# Patient Record
Sex: Female | Born: 1935 | Race: White | Hispanic: No | Marital: Single | Smoking: Never smoker
Health system: Southern US, Community
[De-identification: ages and names within clinical notes are randomized; demographics above are authoritative.]

## PROBLEM LIST (undated history)

## (undated) DIAGNOSIS — M1990 Unspecified osteoarthritis, unspecified site: Secondary | ICD-10-CM

## (undated) DIAGNOSIS — N189 Chronic kidney disease, unspecified: Secondary | ICD-10-CM

## (undated) DIAGNOSIS — R42 Dizziness and giddiness: Secondary | ICD-10-CM

## (undated) DIAGNOSIS — T7840XA Allergy, unspecified, initial encounter: Secondary | ICD-10-CM

## (undated) DIAGNOSIS — E039 Hypothyroidism, unspecified: Secondary | ICD-10-CM

## (undated) DIAGNOSIS — E785 Hyperlipidemia, unspecified: Secondary | ICD-10-CM

## (undated) DIAGNOSIS — I10 Essential (primary) hypertension: Secondary | ICD-10-CM

## (undated) DIAGNOSIS — M810 Age-related osteoporosis without current pathological fracture: Secondary | ICD-10-CM

## (undated) DIAGNOSIS — C9000 Multiple myeloma not having achieved remission: Secondary | ICD-10-CM

## (undated) DIAGNOSIS — H269 Unspecified cataract: Secondary | ICD-10-CM

## (undated) DIAGNOSIS — I639 Cerebral infarction, unspecified: Secondary | ICD-10-CM

## (undated) HISTORY — PX: TONSILLECTOMY: SUR1361

## (undated) HISTORY — DX: Allergy, unspecified, initial encounter: T78.40XA

## (undated) HISTORY — DX: Chronic kidney disease, unspecified: N18.9

## (undated) HISTORY — PX: ABDOMINAL HYSTERECTOMY: SHX81

## (undated) HISTORY — DX: Unspecified cataract: H26.9

## (undated) HISTORY — DX: Multiple myeloma not having achieved remission: C90.00

## (undated) HISTORY — DX: Essential (primary) hypertension: I10

## (undated) HISTORY — DX: Unspecified osteoarthritis, unspecified site: M19.90

## (undated) HISTORY — DX: Cerebral infarction, unspecified: I63.9

## (undated) HISTORY — PX: TONSILLECTOMY AND ADENOIDECTOMY: SHX28

---
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
## 1972-01-29 HISTORY — PX: APPENDECTOMY: SHX54

## 2004-05-07 ENCOUNTER — Ambulatory Visit: Payer: Self-pay | Admitting: Unknown Physician Specialty

## 2004-09-19 ENCOUNTER — Ambulatory Visit: Payer: Self-pay | Admitting: Unknown Physician Specialty

## 2005-05-22 ENCOUNTER — Ambulatory Visit: Payer: Self-pay | Admitting: Unknown Physician Specialty

## 2005-06-03 ENCOUNTER — Ambulatory Visit: Payer: Self-pay | Admitting: Unknown Physician Specialty

## 2005-10-16 ENCOUNTER — Ambulatory Visit: Payer: Self-pay | Admitting: Unknown Physician Specialty

## 2005-11-04 HISTORY — PX: BREAST BIOPSY: SHX20

## 2005-12-02 ENCOUNTER — Ambulatory Visit: Payer: Self-pay | Admitting: General Surgery

## 2006-06-11 ENCOUNTER — Ambulatory Visit: Payer: Self-pay | Admitting: General Surgery

## 2007-06-11 ENCOUNTER — Ambulatory Visit: Payer: Self-pay | Admitting: Unknown Physician Specialty

## 2007-06-18 ENCOUNTER — Ambulatory Visit: Payer: Self-pay | Admitting: Unknown Physician Specialty

## 2007-11-08 ENCOUNTER — Emergency Department: Payer: Self-pay | Admitting: Unknown Physician Specialty

## 2008-06-22 ENCOUNTER — Ambulatory Visit: Payer: Self-pay | Admitting: Unknown Physician Specialty

## 2009-07-19 ENCOUNTER — Ambulatory Visit: Payer: Self-pay | Admitting: Unknown Physician Specialty

## 2010-02-05 ENCOUNTER — Ambulatory Visit: Payer: Self-pay | Admitting: Unknown Physician Specialty

## 2010-08-07 ENCOUNTER — Ambulatory Visit: Payer: Self-pay | Admitting: Unknown Physician Specialty

## 2010-10-30 ENCOUNTER — Ambulatory Visit: Payer: Self-pay | Admitting: Unknown Physician Specialty

## 2010-10-30 LAB — HM COLONOSCOPY

## 2011-06-19 DIAGNOSIS — H2510 Age-related nuclear cataract, unspecified eye: Secondary | ICD-10-CM | POA: Diagnosis not present

## 2011-06-29 HISTORY — PX: EYE SURGERY: SHX253

## 2011-07-02 ENCOUNTER — Ambulatory Visit: Payer: Self-pay | Admitting: Ophthalmology

## 2011-07-02 DIAGNOSIS — I10 Essential (primary) hypertension: Secondary | ICD-10-CM | POA: Diagnosis not present

## 2011-07-02 DIAGNOSIS — Z01812 Encounter for preprocedural laboratory examination: Secondary | ICD-10-CM | POA: Diagnosis not present

## 2011-07-02 DIAGNOSIS — Z01810 Encounter for preprocedural cardiovascular examination: Secondary | ICD-10-CM | POA: Diagnosis not present

## 2011-07-02 DIAGNOSIS — H2510 Age-related nuclear cataract, unspecified eye: Secondary | ICD-10-CM | POA: Diagnosis not present

## 2011-07-02 LAB — POTASSIUM: Potassium: 3.5 mmol/L (ref 3.5–5.1)

## 2011-07-08 DIAGNOSIS — E785 Hyperlipidemia, unspecified: Secondary | ICD-10-CM | POA: Diagnosis not present

## 2011-07-08 DIAGNOSIS — R351 Nocturia: Secondary | ICD-10-CM | POA: Diagnosis not present

## 2011-07-08 DIAGNOSIS — R5381 Other malaise: Secondary | ICD-10-CM | POA: Diagnosis not present

## 2011-07-08 DIAGNOSIS — I10 Essential (primary) hypertension: Secondary | ICD-10-CM | POA: Diagnosis not present

## 2011-07-08 DIAGNOSIS — R5383 Other fatigue: Secondary | ICD-10-CM | POA: Diagnosis not present

## 2011-07-10 DIAGNOSIS — I10 Essential (primary) hypertension: Secondary | ICD-10-CM | POA: Diagnosis not present

## 2011-07-10 DIAGNOSIS — E785 Hyperlipidemia, unspecified: Secondary | ICD-10-CM | POA: Diagnosis not present

## 2011-07-10 DIAGNOSIS — M25569 Pain in unspecified knee: Secondary | ICD-10-CM | POA: Diagnosis not present

## 2011-07-15 ENCOUNTER — Ambulatory Visit: Payer: Self-pay | Admitting: Ophthalmology

## 2011-07-15 DIAGNOSIS — H269 Unspecified cataract: Secondary | ICD-10-CM | POA: Diagnosis not present

## 2011-07-15 DIAGNOSIS — R609 Edema, unspecified: Secondary | ICD-10-CM | POA: Diagnosis not present

## 2011-07-15 DIAGNOSIS — Z79899 Other long term (current) drug therapy: Secondary | ICD-10-CM | POA: Diagnosis not present

## 2011-07-15 DIAGNOSIS — Z9109 Other allergy status, other than to drugs and biological substances: Secondary | ICD-10-CM | POA: Diagnosis not present

## 2011-07-15 DIAGNOSIS — K219 Gastro-esophageal reflux disease without esophagitis: Secondary | ICD-10-CM | POA: Diagnosis not present

## 2011-07-15 DIAGNOSIS — H2510 Age-related nuclear cataract, unspecified eye: Secondary | ICD-10-CM | POA: Diagnosis not present

## 2011-07-15 DIAGNOSIS — M129 Arthropathy, unspecified: Secondary | ICD-10-CM | POA: Diagnosis not present

## 2011-07-15 DIAGNOSIS — M810 Age-related osteoporosis without current pathological fracture: Secondary | ICD-10-CM | POA: Diagnosis not present

## 2011-07-15 DIAGNOSIS — Z7982 Long term (current) use of aspirin: Secondary | ICD-10-CM | POA: Diagnosis not present

## 2011-08-08 ENCOUNTER — Ambulatory Visit: Payer: Self-pay | Admitting: Physician Assistant

## 2011-08-08 DIAGNOSIS — Z1231 Encounter for screening mammogram for malignant neoplasm of breast: Secondary | ICD-10-CM | POA: Diagnosis not present

## 2011-08-08 DIAGNOSIS — M79609 Pain in unspecified limb: Secondary | ICD-10-CM | POA: Diagnosis not present

## 2011-08-15 DIAGNOSIS — M779 Enthesopathy, unspecified: Secondary | ICD-10-CM | POA: Diagnosis not present

## 2011-08-15 DIAGNOSIS — M79609 Pain in unspecified limb: Secondary | ICD-10-CM | POA: Diagnosis not present

## 2011-08-30 ENCOUNTER — Ambulatory Visit (INDEPENDENT_AMBULATORY_CARE_PROVIDER_SITE_OTHER): Payer: Medicare Other | Admitting: Internal Medicine

## 2011-08-30 ENCOUNTER — Encounter: Payer: Self-pay | Admitting: Internal Medicine

## 2011-08-30 VITALS — BP 126/68 | HR 80 | Temp 98.0°F | Resp 16 | Ht 64.0 in | Wt 162.5 lb

## 2011-08-30 DIAGNOSIS — M810 Age-related osteoporosis without current pathological fracture: Secondary | ICD-10-CM

## 2011-08-30 DIAGNOSIS — K219 Gastro-esophageal reflux disease without esophagitis: Secondary | ICD-10-CM

## 2011-08-30 DIAGNOSIS — I10 Essential (primary) hypertension: Secondary | ICD-10-CM | POA: Diagnosis not present

## 2011-08-30 DIAGNOSIS — E785 Hyperlipidemia, unspecified: Secondary | ICD-10-CM | POA: Diagnosis not present

## 2011-08-30 NOTE — Progress Notes (Signed)
Patient ID: Kyan Giannone, female   DOB: March 25, 1934, 77 y.o.   MRN: [id number]  Patient Active Problem List  Diagnosis  . Hypertension  . Hyperlipidemia LDL goal < 100  . Osteoporosis, post-menopausal    Subjective:  CC:   Chief Complaint  Patient presents with  . New Patient    HPI:   Osiris Charles is a 77 y.o. female who presents as a new patient to establish primary care with the chief complaint of Hypertension.  She is a healthy 76 year old white female with a history of osteoporosis and hypertension, previously managed [facility]. She was last seen by Dr. Lin Givens in May of 2012 at which time cholesterol was well controlled and renal function was normal. She has kept a detailed list of every procedure ever done to her starting in 1946 with a tonsillectomy. She is up-to-date on all screening with her last mammogram occurring in July 2013  at [facility], her last physical in June of 2013 ,  And her last colonoscopy noting only internal hemorrhoids in October 2012. Her last bone density test was June 2012. She feels generally well. She is physically active with a walking program several days a week. She maintains a healthy diet has no trouble sleeping, and has no history of recent or remote falls.   Past Medical History  Diagnosis Date  . Hypertension     Past Surgical History  Procedure Date  . Abdominal hysterectomy     Family History  Problem Relation Age of Onset  . Heart disease Mother   . Cancer Sister 59    colon ca,  . Cancer Sister     AML    History   Social History  . Marital Status: Single    Spouse Name: N/A    Number of Children: N/A  . Years of Education: N/A   Occupational History  . Not on file.   Social History Main Topics  . Smoking status: Never Smoker   . Smokeless tobacco: Never Used  . Alcohol Use: No  . Drug Use: No  . Sexually Active: Not on file   Other Topics Concern  . Not on file   Social History  Narrative  . No narrative on file        Allergies  Allergen Reactions  . Ciprofloxacin   . Flagyl (Metronidazole)   . Iodine   . Sudafed (Pseudoephedrine Hcl)     Review of Systems:   The remainder of the review of systems was negative except those addressed in the HPI.       Objective:  BP 126/68  Pulse 80  Temp 98 F (36.7 C) (Oral)  Resp 16  Ht 5\' 4"  (1.626 m)  Wt 162 lb 8 oz (73.71 kg)  BMI 27.89 kg/m2  SpO2 96%  General appearance: alert, cooperative and appears stated age Ears: normal TM's and external ear canals both ears Throat: lips, mucosa, and tongue normal; teeth and gums normal Neck: no adenopathy, no carotid bruit, supple, symmetrical, trachea midline and thyroid not enlarged, symmetric, no tenderness/mass/nodules Back: symmetric, no curvature. ROM normal. No CVA tenderness. Lungs: clear to auscultation bilaterally Heart: regular rate and rhythm, S1, S2 normal, no murmur, click, rub or gallop Abdomen: soft, non-tender; bowel sounds normal; no masses,  no organomegaly Pulses: 2+ and symmetric Skin: Skin color, texture, turgor normal. No rashes or lesions Lymph nodes: Cervical, supraclavicular, and axillary nodes normal.  Assessment and Plan:  Hypertension Currently well-controlled.  She has had normal renal function per review of prior records. She is due for repeat fasting lipids and renal function this year. No changes today.  Hyperlipidemia LDL goal < 100 Well-controlled on diet alone by review of prior May 2012 fasting lipids with an LDL of 91 HDL of 39.5. However she does have mild triglyceridemia with triglycerides of 237 at that time. Low glycemic index diet and walking program for exercise were discussed today as ways to manage this without pharmacotherapy. We will repeat this panel prior to her next visit.  Osteoporosis, post-menopausal With no history of fractures. She has no interest in pharmacotherapy at this point. She does have a  history of adverse reactions to multiple medications including Cipro Flagyl anesthetics etc. Will request copies of her last DEXA scan and discuss at next visit. Continue calcium and vitamin D   Updated Medication List Outpatient Encounter Prescriptions as of 08/30/2011  Medication Sig Dispense Refill  . aspirin 81 MG tablet Take 81 mg by mouth daily.      . calcium-vitamin D (OSCAL WITH D) 250-125 MG-UNIT per tablet Take 1 tablet by mouth daily.      . Cholecalciferol (VITAMIN D3) 1000 UNITS CAPS Take by mouth.      . conjugated estrogens (PREMARIN) vaginal cream Place 1 g vaginally 3 (three) times a week.      . fish oil-omega-3 fatty acids 1000 MG capsule Take 2 g by mouth daily.      . metoprolol tartrate (LOPRESSOR) 25 MG tablet Take 25 mg by mouth 2 (two) times daily.      . Multiple Vitamin (MULTIVITAMIN) tablet Take 1 tablet by mouth daily.      Marland Kitchen triamterene-hydrochlorothiazide (MAXZIDE-25) 37.5-25 MG per tablet Take 0.5 tablets by mouth daily.         Orders Placed This Encounter  Procedures  . HM MAMMOGRAPHY  . HM PAP SMEAR  . HM COLONOSCOPY    No Follow-up on file.

## 2011-09-01 ENCOUNTER — Encounter: Payer: Self-pay | Admitting: Internal Medicine

## 2011-09-01 DIAGNOSIS — M810 Age-related osteoporosis without current pathological fracture: Secondary | ICD-10-CM | POA: Insufficient documentation

## 2011-09-01 DIAGNOSIS — K219 Gastro-esophageal reflux disease without esophagitis: Secondary | ICD-10-CM | POA: Insufficient documentation

## 2011-09-01 NOTE — Assessment & Plan Note (Signed)
With no history of fractures. She has no interest in pharmacotherapy at this point. She does have a history of adverse reactions to multiple medications including Cipro Flagyl anesthetics etc. Will request copies of her last DEXA scan and discuss at next visit. Continue calcium and vitamin D

## 2011-09-01 NOTE — Assessment & Plan Note (Signed)
Well-controlled on diet alone by review of prior May 2012 fasting lipids with an LDL of 91 HDL of 39.5. However she does have mild triglyceridemia with triglycerides of 237 at that time. Low glycemic index diet and walking program for exercise were discussed today as ways to manage this without pharmacotherapy. We will repeat this panel prior to her next visit.

## 2011-09-01 NOTE — Assessment & Plan Note (Signed)
Re: manage with dietary restrictions.

## 2011-09-01 NOTE — Assessment & Plan Note (Signed)
Currently well-controlled. She has had normal renal function per review of prior records. She is due for repeat fasting lipids and renal function this year. No changes today.

## 2011-10-22 DIAGNOSIS — Z23 Encounter for immunization: Secondary | ICD-10-CM | POA: Diagnosis not present

## 2011-12-03 DIAGNOSIS — K649 Unspecified hemorrhoids: Secondary | ICD-10-CM | POA: Diagnosis not present

## 2011-12-11 DIAGNOSIS — K645 Perianal venous thrombosis: Secondary | ICD-10-CM | POA: Diagnosis not present

## 2011-12-30 ENCOUNTER — Encounter: Payer: Self-pay | Admitting: Internal Medicine

## 2011-12-30 ENCOUNTER — Ambulatory Visit (INDEPENDENT_AMBULATORY_CARE_PROVIDER_SITE_OTHER): Payer: Medicare Other | Admitting: Internal Medicine

## 2011-12-30 VITALS — BP 128/68 | HR 78 | Temp 98.3°F | Resp 12 | Ht 64.0 in | Wt 164.0 lb

## 2011-12-30 DIAGNOSIS — R739 Hyperglycemia, unspecified: Secondary | ICD-10-CM

## 2011-12-30 DIAGNOSIS — I10 Essential (primary) hypertension: Secondary | ICD-10-CM | POA: Diagnosis not present

## 2011-12-30 DIAGNOSIS — K649 Unspecified hemorrhoids: Secondary | ICD-10-CM | POA: Diagnosis not present

## 2011-12-30 DIAGNOSIS — Z1331 Encounter for screening for depression: Secondary | ICD-10-CM

## 2011-12-30 DIAGNOSIS — R7309 Other abnormal glucose: Secondary | ICD-10-CM

## 2011-12-30 DIAGNOSIS — K648 Other hemorrhoids: Secondary | ICD-10-CM

## 2011-12-30 DIAGNOSIS — E785 Hyperlipidemia, unspecified: Secondary | ICD-10-CM

## 2011-12-30 DIAGNOSIS — Z79899 Other long term (current) drug therapy: Secondary | ICD-10-CM

## 2011-12-30 LAB — BASIC METABOLIC PANEL
BUN: 22 mg/dL (ref 6–23)
CO2: 32 mEq/L (ref 19–32)
Calcium: 9.4 mg/dL (ref 8.4–10.5)
Creatinine, Ser: 1.1 mg/dL (ref 0.4–1.2)
GFR: 53.71 mL/min — ABNORMAL LOW (ref >60.00)
Glucose, Bld: 124 mg/dL — ABNORMAL HIGH (ref 70–99)

## 2011-12-30 MED ORDER — TETANUS-DIPHTH-ACELL PERTUSSIS 5-2.5-18.5 LF-MCG/0.5 IM SUSP: 0.5000 mL | Freq: Once | INTRAMUSCULAR | Status: DC

## 2011-12-30 MED ORDER — DTAP-HEPATITIS B RECOMB-IPV IM SUSP: 0.5000 mL | Freq: Once | INTRAMUSCULAR | Status: DC

## 2011-12-30 NOTE — Patient Instructions (Addendum)
The TDaP vaccine should cost you less than $110 at one of the local drugstores or the [facility]

## 2011-12-30 NOTE — Progress Notes (Signed)
Patient ID: Tina Silva, female   DOB: [birth year], 77 y.o.   MRN: [id number]  Patient Active Problem List  Diagnosis  . Hypertension  . Hyperlipidemia LDL goal < 100  . Osteoporosis, post-menopausal  . Acid reflux  . Hemorrhoid prolapse  . Hyperglycemia    Subjective:  CC:   Chief Complaint  Patient presents with  . Follow-up    HPI:   Tina Matlockis a 77 y.o. female who presents  Follow up on chronic and acute issues.  On Nov 5th had a marble sized hemorrhoid prolapsed after a bowel movement. She was evaluated at Urgent care and referred to Dr. Michela Pitcher for a thrombosed hemorrhoid.  The hemorrhoid is not causing any discomfort currently and is resolving slowly on its own. Not constipated historically, but had done some heavy lifting of furniture the week prior and plays trombone in the church [employer]  Past Medical History  Diagnosis Date  . Hypertension     Past Surgical History  Procedure Date  . Abdominal hysterectomy   . Appendectomy 1974  . Eye surgery june 2013    cataract with lens,  dinglein left eye     The following portions of the patient's history were reviewed and updated as appropriate: Allergies, current medications, and problem list.    Review of Systems:  Patient denies headache, fevers, malaise, unintentional weight loss, skin rash, eye pain, sinus congestion and sinus pain, sore throat, dysphagia,  hemoptysis , cough, dyspnea, wheezing, chest pain, palpitations, orthopnea, edema, abdominal pain, nausea, melena, diarrhea, constipation, flank pain, dysuria, hematuria, urinary  Frequency, nocturia, numbness, tingling, seizures,  Focal weakness, Loss of consciousness,  Tremor, insomnia, depression, anxiety, and suicidal ideation.       History   Social History  . Marital Status: Single    Spouse Name: N/A    Number of Children: N/A  . Years of Education: N/A   Occupational History  . Not on file.   Social History Main Topics  . Smoking status:  Never Smoker   . Smokeless tobacco: Never Used  . Alcohol Use: No  . Drug Use: No  . Sexually Active: Not on file   Other Topics Concern  . Not on file   Social History Narrative  . No narrative on file    Objective:  BP 128/68  Pulse 78  Temp 98.3 F (36.8 C) (Oral)  Resp 12  Ht 5\' 4"  (1.626 m)  Wt 164 lb (74.39 kg)  BMI 28.15 kg/m2  SpO2 96%  General appearance: alert, cooperative and appears stated age Ears: normal TM's and external ear canals both ears Throat: lips, mucosa, and tongue normal; teeth and gums normal Neck: no adenopathy, no carotid bruit, supple, symmetrical, trachea midline and thyroid not enlarged, symmetric, no tenderness/mass/nodules Back: symmetric, no curvature. ROM normal. No CVA tenderness. Lungs: clear to auscultation bilaterally Heart: regular rate and rhythm, S1, S2 normal, no murmur, click, rub or gallop Abdomen: soft, non-tender; bowel sounds normal; no masses,  no organomegaly Pulses: 2+ and symmetric Skin: Skin color, texture, turgor normal. No rashes or lesions Lymph nodes: Cervical, supraclavicular, and axillary nodes normal.  Assessment and Plan:  Hemorrhoid prolapse Recommend stool softeners and HC cream prn.   Hypertension Well controlled on current regimen. Renal function stable, no changes today.  Hyperglycemia Noted on BMET today.  Will have patient return for fasting glucose and hgba1c.     Updated Medication List Outpatient Encounter Prescriptions as of 12/30/2011  Medication Sig Dispense Refill  .  aspirin 81 MG tablet Take 81 mg by mouth daily.      . calcium-vitamin D (OSCAL WITH D) 250-125 MG-UNIT per tablet Take 1 tablet by mouth daily.      . Cholecalciferol (VITAMIN D3) 1000 UNITS CAPS Take by mouth.      . conjugated estrogens (PREMARIN) vaginal cream Place 1 g vaginally 3 (three) times a week.      . fish oil-omega-3 fatty acids 1000 MG capsule Take 2 g by mouth daily.      . metoprolol tartrate (LOPRESSOR) 25  MG tablet Take 25 mg by mouth 2 (two) times daily.      . Multiple Vitamin (MULTIVITAMIN) tablet Take 1 tablet by mouth daily.      Marland Kitchen triamterene-hydrochlorothiazide (MAXZIDE-25) 37.5-25 MG per tablet Take 0.5 tablets by mouth daily.      . TDaP (BOOSTRIX) 5-2.5-18.5 LF-MCG/0.5 injection Inject 0.5 mLs into the muscle once.  0.5 mL  0  . [DISCONTINUED] DTAP-hepatitis B recombinant-IPV (PEDIARIX) injection Inject 0.5 mLs into the muscle once.  0.5 mL  0  . [DISCONTINUED] TDaP (BOOSTRIX) 5-2.5-18.5 LF-MCG/0.5 injection Inject 0.5 mLs into the muscle once.  0.5 mL  0       .

## 2012-01-01 ENCOUNTER — Encounter: Payer: Self-pay | Admitting: Internal Medicine

## 2012-01-01 DIAGNOSIS — R739 Hyperglycemia, unspecified: Secondary | ICD-10-CM | POA: Insufficient documentation

## 2012-01-01 DIAGNOSIS — K648 Other hemorrhoids: Secondary | ICD-10-CM | POA: Insufficient documentation

## 2012-01-01 NOTE — Assessment & Plan Note (Signed)
Recommend stool softeners and HC cream prn.

## 2012-01-01 NOTE — Assessment & Plan Note (Signed)
Noted on BMET today.  Will have patient return for fasting glucose and hgba1c.

## 2012-01-01 NOTE — Assessment & Plan Note (Signed)
Well controlled on current regimen. Renal function stable, no changes today. 

## 2012-01-10 ENCOUNTER — Other Ambulatory Visit (INDEPENDENT_AMBULATORY_CARE_PROVIDER_SITE_OTHER): Payer: Medicare Other

## 2012-01-10 DIAGNOSIS — R739 Hyperglycemia, unspecified: Secondary | ICD-10-CM

## 2012-01-10 DIAGNOSIS — R7309 Other abnormal glucose: Secondary | ICD-10-CM | POA: Diagnosis not present

## 2012-01-10 DIAGNOSIS — E785 Hyperlipidemia, unspecified: Secondary | ICD-10-CM

## 2012-01-10 LAB — COMPREHENSIVE METABOLIC PANEL
ALT: 22 U/L (ref 0–35)
BUN: 21 mg/dL (ref 6–23)
CO2: 30 mEq/L (ref 19–32)
Calcium: 9.1 mg/dL (ref 8.4–10.5)
Chloride: 102 mEq/L (ref 96–112)
Creatinine, Ser: 1 mg/dL (ref 0.4–1.2)
GFR: 57.44 mL/min — ABNORMAL LOW (ref >60.00)
Glucose, Bld: 105 mg/dL — ABNORMAL HIGH (ref 70–99)

## 2012-01-10 LAB — LIPID PANEL
Cholesterol: 182 mg/dL (ref 0–200)
Total CHOL/HDL Ratio: 5
Triglycerides: 128 mg/dL (ref 0.0–149.0)

## 2012-01-10 LAB — HEMOGLOBIN A1C: Hgb A1c MFr Bld: 5.6 % (ref 4.6–6.5)

## 2012-01-13 NOTE — Addendum Note (Signed)
Addended by: Sherlene Shams on: 01/13/2012 12:35 PM   Modules accepted: Orders

## 2012-02-04 ENCOUNTER — Encounter: Payer: Self-pay | Admitting: Internal Medicine

## 2012-02-04 ENCOUNTER — Ambulatory Visit (INDEPENDENT_AMBULATORY_CARE_PROVIDER_SITE_OTHER): Payer: Medicare Other | Admitting: Internal Medicine

## 2012-02-04 VITALS — BP 140/80 | HR 83 | Temp 98.1°F | Resp 16 | Wt 162.2 lb

## 2012-02-04 DIAGNOSIS — J069 Acute upper respiratory infection, unspecified: Secondary | ICD-10-CM

## 2012-02-04 MED ORDER — AMOXICILLIN-POT CLAVULANATE 875-125 MG PO TABS: 1.0000 | ORAL_TABLET | Freq: Two times a day (BID) | ORAL | Status: DC

## 2012-02-04 NOTE — Patient Instructions (Addendum)
You have a viral  Syndrome .  The post nasal drip is causing your hoarseness.  Flush your sinuses twice daily with Simply saline nasal spray.  Use benadryl 25 mg every 8 hours for drainage and Sudafed PE 10 mg  every 8 hours to manage any congestion.  Gargle with salt water or "alkalol" (available OTC) for the sore throat. Or try warm tea with honey  Use Robitussin DM  Or any cough syrup that has guaifenesin and dextromethorphan for the cough   If the sinus drainage  is no better  In 3 to 4 days OR  if you develop T > 100.4,  Ear  Or facial pain,  Start the amoxicillin (antibiotic).

## 2012-02-04 NOTE — Assessment & Plan Note (Signed)
Symptoms of  URI are caused by viral infection currently given her current symptoms.   I have explained that in viral URIS, an antibiotic will not help the symptoms and will increase the risk of developing diarrhea. Advised to use oral and nasal decongestants,  Ibuprofen 400 mg and tylenol 650 mq 8 hrs for aches and pains,  tessalon every 8 hours prn cough  Advised to start round of abx only if symptoms worsen to include fevers, facial pain, purulent sputum./drainage.  

## 2012-02-04 NOTE — Progress Notes (Signed)
Patient ID: Tina Silva, female   DOB: [birth year], 78 y.o.   MRN: [id number]  Patient Active Problem List  Diagnosis  . Hypertension  . Hyperlipidemia LDL goal < 100  . Osteoporosis, post-menopausal  . Acid reflux  . Hemorrhoid prolapse  . Hyperglycemia  . Acute URI    Subjective:  CC:   Chief Complaint  Patient presents with  . Sinusitis    x 6 days    HPI:   Tina Comins Matlockis a 78 y.o. female who presents with URi symptoms .  She develop ed a hacking cough which started on January 1 followed by loss of her voice in January 2. Her cough is actually improved.  Chief symptom is  purulent sinus drainage without sinus pain fevers or ear pain. No wheezing or dyspnea. 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  she had her flu shot this year . Has not gone at the hospital since the onset of her symptoms.   Past Medical History  Diagnosis Date  . Hypertension     Past Surgical History  Procedure Date  . Abdominal hysterectomy   . Appendectomy 1974  . Eye surgery june 2013    cataract with lens,  dinglein left eye          The following portions of the patient's history were reviewed and updated as appropriate: Allergies, current medications, and problem list.    Review of Systems:   Patient denies headache, fevers, malaise, unintentional weight loss, skin rash, eye pain, sinus congestion and sinus pain, sore throat, dysphagia,  hemoptysis , cough, dyspnea, wheezing, chest pain, palpitations, orthopnea, edema, abdominal pain, nausea, melena, diarrhea, constipation, flank pain, dysuria, hematuria, urinary  Frequency, nocturia, numbness, tingling, seizures,  Focal weakness, Loss of consciousness,  Tremor, insomnia, depression, anxiety, and suicidal ideation.       History   Social History  . Marital Status: Single    Spouse Name: N/A    Number of Children: N/A  . Years of Education: N/A   Occupational History  . Not on file.   Social History Main  Topics  . Smoking status: Never Smoker   . Smokeless tobacco: Never Used  . Alcohol Use: No  . Drug Use: No  . Sexually Active: Not on file   Other Topics Concern  . Not on file   Social History Narrative  . No narrative on file    Objective:  BP 140/80  Pulse 83  Temp 98.1 F (36.7 C) (Oral)  Resp 16  Wt 162 lb 4 oz (73.596 kg)  SpO2 93%  General appearance: alert, cooperative and appears stated age Ears: normal TM's and external ear canals both ears Throat: lips, mucosa, and tongue normal; teeth and gums normal Neck: no adenopathy, no carotid bruit, supple, symmetrical, trachea midline and thyroid not enlarged, symmetric, no tenderness/mass/nodules Back: symmetric, no curvature. ROM normal. No CVA tenderness. Lungs: clear to auscultation bilaterally Heart: regular rate and rhythm, S1, S2 normal, no murmur, click, rub or gallop Abdomen: soft, non-tender; bowel sounds normal; no masses,  no organomegaly Pulses: 2+ and symmetric Skin: Skin color, texture, turgor normal. No rashes or lesions Lymph nodes: Cervical, supraclavicular, and axillary nodes normal.  Assessment and Plan:  Acute URI Symptoms of  URI are caused by viral infection currently given her current symptoms.   I have explained that in viral URIS, an antibiotic will not help the symptoms and will increase the risk of developing diarrhea. Advised to use  oral and nasal decongestants,  Ibuprofen 400 mg and tylenol 650 mq 8 hrs for aches and pains,  tessalon every 8 hours prn cough  Advised to start round of abx only if symptoms worsen to include fevers, facial pain, purulent sputum./drainage.    Updated Medication List Outpatient Encounter Prescriptions as of 02/04/2012  Medication Sig Dispense Refill  . aspirin 81 MG tablet Take 81 mg by mouth daily.      . calcium-vitamin D (OSCAL WITH D) 250-125 MG-UNIT per tablet Take 1 tablet by mouth daily.      . Cholecalciferol (VITAMIN D3) 1000 UNITS CAPS Take by mouth.       . conjugated estrogens (PREMARIN) vaginal cream Place 1 g vaginally 3 (three) times a week.      . fish oil-omega-3 fatty acids 1000 MG capsule Take 2 g by mouth daily.      . metoprolol tartrate (LOPRESSOR) 25 MG tablet Take 25 mg by mouth 2 (two) times daily.      . Multiple Vitamin (MULTIVITAMIN) tablet Take 1 tablet by mouth daily.      . TDaP (BOOSTRIX) 5-2.5-18.5 LF-MCG/0.5 injection Inject 0.5 mLs into the muscle once.  0.5 mL  0  . triamterene-hydrochlorothiazide (MAXZIDE-25) 37.5-25 MG per tablet Take 0.5 tablets by mouth daily.      Marland Kitchen amoxicillin-clavulanate (AUGMENTIN) 875-125 MG per tablet Take 1 tablet by mouth 2 (two) times daily.  14 tablet  0     No orders of the defined types were placed in this encounter.    No Follow-up on file.

## 2012-04-24 DIAGNOSIS — Z961 Presence of intraocular lens: Secondary | ICD-10-CM | POA: Diagnosis not present

## 2012-04-24 DIAGNOSIS — H2510 Age-related nuclear cataract, unspecified eye: Secondary | ICD-10-CM | POA: Diagnosis not present

## 2012-05-05 DIAGNOSIS — M659 Synovitis and tenosynovitis, unspecified: Secondary | ICD-10-CM | POA: Diagnosis not present

## 2012-06-12 ENCOUNTER — Other Ambulatory Visit: Payer: Self-pay | Admitting: *Deleted

## 2012-06-12 MED ORDER — METOPROLOL TARTRATE 25 MG PO TABS: 25.0000 mg | ORAL_TABLET | Freq: Two times a day (BID) | ORAL | Status: DC

## 2012-06-12 NOTE — Telephone Encounter (Signed)
Rx sent to pharmacy by escript  

## 2012-06-16 DIAGNOSIS — K625 Hemorrhage of anus and rectum: Secondary | ICD-10-CM | POA: Diagnosis not present

## 2012-06-30 ENCOUNTER — Encounter: Payer: Self-pay | Admitting: Internal Medicine

## 2012-06-30 ENCOUNTER — Other Ambulatory Visit (HOSPITAL_COMMUNITY)
Admission: RE | Admit: 2012-06-30 | Discharge: 2012-06-30 | Disposition: A | Discharge status: Home / Self Care | Payer: Medicare Other | Source: Ambulatory Visit | Attending: Internal Medicine | Admitting: Internal Medicine

## 2012-06-30 ENCOUNTER — Ambulatory Visit (INDEPENDENT_AMBULATORY_CARE_PROVIDER_SITE_OTHER)
Admission: RE | Admit: 2012-06-30 | Discharge: 2012-06-30 | Disposition: A | Discharge status: Home / Self Care | Payer: Medicare Other | Source: Ambulatory Visit | Attending: Internal Medicine | Admitting: Internal Medicine

## 2012-06-30 ENCOUNTER — Ambulatory Visit (INDEPENDENT_AMBULATORY_CARE_PROVIDER_SITE_OTHER): Payer: Medicare Other | Admitting: Internal Medicine

## 2012-06-30 VITALS — BP 130/70 | HR 64 | Temp 98.4°F | Resp 14 | Ht 64.0 in | Wt 166.2 lb

## 2012-06-30 DIAGNOSIS — M5431 Sciatica, right side: Secondary | ICD-10-CM

## 2012-06-30 DIAGNOSIS — E559 Vitamin D deficiency, unspecified: Secondary | ICD-10-CM

## 2012-06-30 DIAGNOSIS — M5430 Sciatica, unspecified side: Secondary | ICD-10-CM

## 2012-06-30 DIAGNOSIS — M25551 Pain in right hip: Secondary | ICD-10-CM | POA: Insufficient documentation

## 2012-06-30 DIAGNOSIS — R5383 Other fatigue: Secondary | ICD-10-CM | POA: Diagnosis not present

## 2012-06-30 DIAGNOSIS — R5381 Other malaise: Secondary | ICD-10-CM

## 2012-06-30 DIAGNOSIS — Z124 Encounter for screening for malignant neoplasm of cervix: Secondary | ICD-10-CM | POA: Diagnosis not present

## 2012-06-30 DIAGNOSIS — Z79899 Other long term (current) drug therapy: Secondary | ICD-10-CM

## 2012-06-30 DIAGNOSIS — Z1151 Encounter for screening for human papillomavirus (HPV): Secondary | ICD-10-CM | POA: Diagnosis not present

## 2012-06-30 DIAGNOSIS — I10 Essential (primary) hypertension: Secondary | ICD-10-CM

## 2012-06-30 DIAGNOSIS — M25559 Pain in unspecified hip: Secondary | ICD-10-CM

## 2012-06-30 DIAGNOSIS — Z0000 Encounter for general adult medical examination without abnormal findings: Secondary | ICD-10-CM

## 2012-06-30 DIAGNOSIS — M47817 Spondylosis without myelopathy or radiculopathy, lumbosacral region: Secondary | ICD-10-CM | POA: Diagnosis not present

## 2012-06-30 DIAGNOSIS — Z01419 Encounter for gynecological examination (general) (routine) without abnormal findings: Secondary | ICD-10-CM | POA: Insufficient documentation

## 2012-06-30 DIAGNOSIS — E785 Hyperlipidemia, unspecified: Secondary | ICD-10-CM

## 2012-06-30 DIAGNOSIS — R7309 Other abnormal glucose: Secondary | ICD-10-CM | POA: Diagnosis not present

## 2012-06-30 DIAGNOSIS — M5137 Other intervertebral disc degeneration, lumbosacral region: Secondary | ICD-10-CM | POA: Diagnosis not present

## 2012-06-30 DIAGNOSIS — R739 Hyperglycemia, unspecified: Secondary | ICD-10-CM

## 2012-06-30 LAB — CBC WITH DIFFERENTIAL/PLATELET
Basophils Relative: 0.4 % (ref 0.0–3.0)
Eosinophils Absolute: 0.1 10*3/uL (ref 0.0–0.7)
Eosinophils Relative: 1.5 % (ref 0.0–5.0)
HCT: 41.6 % (ref 36.0–46.0)
Lymphs Abs: 1.4 10*3/uL (ref 0.7–4.0)
MCHC: 34.2 g/dL (ref 30.0–36.0)
MCV: 94.3 fl (ref 78.0–100.0)
Monocytes Absolute: 0.4 10*3/uL (ref 0.1–1.0)
Neutrophils Relative %: 72.2 % (ref 43.0–77.0)
RBC: 4.41 Mil/uL (ref 3.87–5.11)

## 2012-06-30 LAB — COMPREHENSIVE METABOLIC PANEL
ALT: 20 U/L (ref 0–35)
AST: 21 U/L (ref 0–37)
Albumin: 3.7 g/dL (ref 3.5–5.2)
Alkaline Phosphatase: 59 U/L (ref 39–117)
Calcium: 9.3 mg/dL (ref 8.4–10.5)
Chloride: 104 mEq/L (ref 96–112)
Creatinine, Ser: 1 mg/dL (ref 0.4–1.2)
Potassium: 4 mEq/L (ref 3.5–5.1)

## 2012-06-30 NOTE — Assessment & Plan Note (Signed)
Annual comprehensive exam was done including breast, pelvic and PAP smear. All screenings have been addressed .  

## 2012-06-30 NOTE — Progress Notes (Signed)
Patient ID: Tina Silva, female   DOB: 05-04-1934, 78 y.o.   MRN: [id number] The patient is here for annual Medicare wellness examination and management of other chronic and acute problems. 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  [facility] with diclofenac bid which she took for 12 days.  The pain improved while she was on it, but returned when she stopped it.  It would occasionally radiate to the foot.  No x rays done . Still bothering her is she sits for more than 1 hours, limps for a while  Saw Dr Sherie Don for hemorrhoids,  Treated with anusol.     The risk factors are reflected in the social history.  The roster of all physicians providing medical care to patient - is listed in the Snapshot section of the chart.  Activities of daily living:  The patient is 100% independent in all ADLs: dressing, toileting, feeding as well as independent mobility  Home safety : The patient has smoke detectors in the home. They wear seatbelts.  There are no firearms at home. There is no violence in the home.   There is no risks for hepatitis, STDs or HIV. There is no   history of blood transfusion. They have no travel history to infectious disease endemic areas of the world.  The patient has seen their dentist in the last six month. They have seen their eye doctor in the last year. They admit to slight hearing difficulty with regard to whispered voices and some television programs.  They have deferred audiologic testing in the last year.  They do not  have excessive sun exposure. Discussed the need for sun protection: hats, long sleeves and use of sunscreen if there is significant sun exposure.   Diet: the importance of a healthy diet is discussed. They do have a healthy diet.  The benefits of regular aerobic exercise were discussed. She walks 4 times per week ,  20 minutes.   Depression screen: there are no signs or  vegative symptoms of depression- irritability, change in appetite, anhedonia, sadness/tearfullness.  Cognitive assessment: the patient manages all their financial and personal affairs and is actively engaged. They could relate day,date,year and events; recalled 2/3 objects at 3 minutes; performed clock-face Silva normally.  The following portions of the patient's history were reviewed and updated as appropriate: allergies, current medications, past family history, past medical history,  past surgical history, past social history  and problem list.  Visual acuity was not assessed per patient preference since she has regular follow up with her ophthalmologist. Hearing and body mass index were assessed and reviewed.   During the course of the visit the patient was educated and counseled about appropriate screening and preventive services including : fall prevention , diabetes screening, nutrition counseling, colorectal cancer screening, and recommended immunizations.    Objective:  BP 130/70  Pulse 64  Temp(Src) 98.4 F (36.9 C) (Oral)  Resp 14  Ht 5\' 4"  (1.626 m)  Wt 166 lb 4 oz (75.411 kg)  BMI 28.52 kg/m2  SpO2 95%  General Appearance:    Alert, cooperative, no distress, appears stated age  Head:    Normocephalic, without obvious abnormality, atraumatic  Eyes:    PERRL, conjunctiva/corneas clear, EOM's intact, fundi    benign, both eyes  Ears:    Normal TM's and external ear canals, both ears  Nose:   Nares normal,  septum midline, mucosa normal, no drainage    or sinus tenderness  Throat:   Lips, mucosa, and tongue normal; teeth and gums normal  Neck:   Supple, symmetrical, trachea midline, no adenopathy;    thyroid:  no enlargement/tenderness/nodules; no carotid   bruit or JVD  Back:     Symmetric, no curvature, ROM normal, no CVA tenderness  Lungs:     Clear to auscultation bilaterally, respirations unlabored  Chest Wall:    No tenderness or deformity   Heart:    Regular rate and  rhythm, S1 and S2 normal, no murmur, rub   or gallop  Breast Exam:    No tenderness, masses, or nipple abnormality  Abdomen:     Soft, non-tender, bowel sounds active all four quadrants,    no masses, no organomegaly  Genitalia:    Pelvic: cervix normal in appearance, external genitalia normal, no adnexal masses or tenderness, no cervical motion tenderness, rectovaginal septum normal, uterus normal size, shape, and consistency and vagina normal without discharge  Extremities:   Extremities normal, atraumatic, no cyanosis or edema  Pulses:   2+ and symmetric all extremities  Skin:   Skin color, texture, turgor normal, no rashes or lesions  Lymph nodes:   Cervical, supraclavicular, and axillary nodes normal  Neurologic:   CNII-XII intact, normal strength, sensation and reflexes    throughout   Assessment and Plan  Routine general medical examination at a health care facility Annual comprehensive exam was done including breast, pelvic and PAP smear. All screenings have been addressed .   Right hip pain Plain films of lumbar spine and right hip were done due to history of osteoporosis  And radiculopathy .  Plain filns were negative for fractrures  But positive for degenerative disease and osteophyte formation which may be causing sciatic nerve root impingement.  Will refer for PT,   Hypertension Well controlled on current regimen. Renal function stable, no changes today.   Hyperlipidemia LDL goal < 100 Well controlled on current regimen. liver enzymes are stable, no changes today.   Updated Medication List Outpatient Encounter Prescriptions as of 06/30/2012  Medication Sig Dispense Refill  . aspirin 81 MG tablet Take 81 mg by mouth daily.      . calcium-vitamin D (OSCAL WITH D) 250-125 MG-UNIT per tablet Take 1 tablet by mouth daily.      . Cholecalciferol (VITAMIN D3) 1000 UNITS CAPS Take by mouth.      . metoprolol tartrate (LOPRESSOR) 25 MG tablet Take 1 tablet (25 mg total) by mouth  2 (two) times daily.  30 tablet  5  . Multiple Vitamin (MULTIVITAMIN) tablet Take 1 tablet by mouth daily.      . TDaP (BOOSTRIX) 5-2.5-18.5 LF-MCG/0.5 injection Inject 0.5 mLs into the muscle once.  0.5 mL  0  . triamterene-hydrochlorothiazide (MAXZIDE-25) 37.5-25 MG per tablet Take 0.5 tablets by mouth daily.      Marland Kitchen amoxicillin-clavulanate (AUGMENTIN) 875-125 MG per tablet Take 1 tablet by mouth 2 (two) times daily.  14 tablet  0  . conjugated estrogens (PREMARIN) vaginal cream Place 1 g vaginally 3 (three) times a week.      . fish oil-omega-3 fatty acids 1000 MG capsule Take 2 g by mouth daily.       No facility-administered encounter medications on file as of 06/30/2012.

## 2012-06-30 NOTE — Assessment & Plan Note (Signed)
Well controlled on current regimen. Renal function stable, no changes today. 

## 2012-06-30 NOTE — Assessment & Plan Note (Signed)
Well controlled on current regimen. liver enzymes are stable, no changes today.

## 2012-06-30 NOTE — Patient Instructions (Addendum)
I have ordered Plain x rays of hip and lumbar spine  To be done at the [facility] office today   Call us when your mammogram reminder comes.   You can resume the diclofenac, but if the abdominal pain recurs, stop taking it

## 2012-06-30 NOTE — Assessment & Plan Note (Signed)
Plain films of lumbar spine and right hip were done due to history of osteoporosis  And radiculopathy .  Plain filns were negative for fractrures  But positive for degenerative disease and osteophyte formation which may be causing sciatic nerve root impingement.  Will refer for PT,

## 2012-07-01 ENCOUNTER — Encounter: Payer: Self-pay | Admitting: Internal Medicine

## 2012-07-03 ENCOUNTER — Encounter: Payer: Self-pay | Admitting: *Deleted

## 2012-07-07 MED ORDER — DICLOFENAC SODIUM 75 MG PO TBEC: 75.0000 mg | DELAYED_RELEASE_TABLET | Freq: Two times a day (BID) | ORAL | Status: DC

## 2012-07-09 ENCOUNTER — Telehealth: Payer: Self-pay | Admitting: Internal Medicine

## 2012-07-09 NOTE — Telephone Encounter (Signed)
No order needed for screening mammogram

## 2012-07-09 NOTE — Telephone Encounter (Signed)
Patient needing an order for a screening mammogram sent to [facility]. She will schedule her own appointment.

## 2012-07-13 DIAGNOSIS — Z23 Encounter for immunization: Secondary | ICD-10-CM | POA: Diagnosis not present

## 2012-07-20 ENCOUNTER — Telehealth: Payer: Self-pay | Admitting: Internal Medicine

## 2012-07-20 DIAGNOSIS — M545 Low back pain: Secondary | ICD-10-CM

## 2012-07-20 NOTE — Telephone Encounter (Signed)
Pt came in today stating that her back pain is not any better. Pt wants to know what dr Darrick Huntsman wants to do next,  Xray report from 06/30/12 in box

## 2012-07-21 MED ORDER — TRAMADOL HCL 50 MG PO TABS: 50.0000 mg | ORAL_TABLET | Freq: Two times a day (BID) | ORAL | Status: DC | PRN

## 2012-07-21 NOTE — Telephone Encounter (Signed)
She is being referred to [facility] for physical therapy.  Her plain films suggested degenerative disk disease .  If her pain becomes constant radiating to her foot, she will need an MRI.  I will call in tramadol for pain control.  She cann take it along with the diclofenac twice daily

## 2012-07-21 NOTE — Telephone Encounter (Signed)
Patient notified as instructed and advised patient to call back if medication does not help.

## 2012-08-10 ENCOUNTER — Ambulatory Visit: Payer: Self-pay | Admitting: Internal Medicine

## 2012-08-10 DIAGNOSIS — Z1231 Encounter for screening mammogram for malignant neoplasm of breast: Secondary | ICD-10-CM | POA: Diagnosis not present

## 2012-08-10 LAB — HM MAMMOGRAPHY: HM Mammogram: NORMAL

## 2012-08-21 ENCOUNTER — Encounter: Payer: Self-pay | Admitting: Internal Medicine

## 2012-08-25 ENCOUNTER — Encounter: Payer: Self-pay | Admitting: Internal Medicine

## 2012-08-25 NOTE — Telephone Encounter (Signed)
Immunization record updated.

## 2012-11-24 ENCOUNTER — Ambulatory Visit (INDEPENDENT_AMBULATORY_CARE_PROVIDER_SITE_OTHER): Payer: Medicare Other | Admitting: Internal Medicine

## 2012-11-24 ENCOUNTER — Encounter: Payer: Self-pay | Admitting: Internal Medicine

## 2012-11-24 VITALS — BP 118/72 | HR 69 | Temp 98.4°F | Resp 12 | Ht 64.0 in | Wt 144.5 lb

## 2012-11-24 DIAGNOSIS — M25579 Pain in unspecified ankle and joints of unspecified foot: Secondary | ICD-10-CM | POA: Diagnosis not present

## 2012-11-24 DIAGNOSIS — M25571 Pain in right ankle and joints of right foot: Secondary | ICD-10-CM

## 2012-11-24 MED ORDER — MELOXICAM 15 MG PO TABS: 15.0000 mg | ORAL_TABLET | Freq: Every day | ORAL | Status: DC

## 2012-11-24 NOTE — Progress Notes (Signed)
Patient ID: Tina Silva, female   DOB: 1934/03/31, 78 y.o.   MRN: [id number]  Patient Active Problem List   Diagnosis Date Noted  . Pain in joint, ankle and foot 11/25/2012  . Routine general medical examination at a health care facility 06/30/2012  . Right hip pain 06/30/2012  . Hemorrhoid prolapse 01/01/2012  . Hyperglycemia 01/01/2012  . Osteoporosis, post-menopausal 09/01/2011  . Acid reflux 09/01/2011  . Hypertension 08/30/2011  . Hyperlipidemia LDL goal < 100 08/30/2011    Subjective:  CC:   Chief Complaint  Patient presents with  . Acute Visit    Burning sensation in top right foot patient reports edema at toe area.    HPI:   Tina Lightsey Matlockis a 78 y.o. female who presents with several weeks of dorsal foot pain after walking,  Right foot only,  Has noticed tender areas on lateral e on anklel and foot as well.  The shoes were not new , they wer not well worn,  Noticed a swelling to the top of foot   No history of gout.  Has been walking for years on a treadmill but quit since this pain developed.    Past Medical History  Diagnosis Date  . Hypertension     Past Surgical History  Procedure Laterality Date  . Abdominal hysterectomy    . Appendectomy  1974  . Eye surgery  june 2013    cataract with lens,  dinglein left eye        The following portions of the patient's history were reviewed and updated as appropriate: Allergies, current medications, and problem list.    Review of Systems:   12 Pt  review of systems was negative except those addressed in the HPI,     History   Social History  . Marital Status: Single    Spouse Name: N/A    Number of Children: N/A  . Years of Education: N/A   Occupational History  . Not on file.   Social History Main Topics  . Smoking status: Never Smoker   . Smokeless tobacco: Never Used  . Alcohol Use: No  . Drug Use: No  . Sexual Activity: Not on file   Other Topics Concern  . Not on file   Social  History Narrative  . No narrative on file    Objective:  Filed Vitals:   11/24/12 1012  BP: 118/72  Pulse: 69  Temp: 98.4 F (36.9 C)  Resp: 12     General appearance: alert, cooperative and appears stated age  Back: symmetric, no curvature. ROM normal. No CVA tenderness. Lungs: clear to auscultation bilaterally Heart: regular rate and rhythm, S1, S2 normal, no murmur, click, rub or gallop Abdomen: soft, non-tender; bowel sounds normal; no masses,  no organomegaly Pulses: 2+ and symmetric Skin: Skin color, texture, turgor normal. No rashes or lesions MSK right foot nontender, no bruising or swelling,  Pulses normal  Lymph nodes: Cervical, supraclavicular, and axillary nodes normal.  Assessment and Plan:  Pain in joint, ankle and foot Exam consistent with ligament strain on dorsum of foot, extensor hallicus longus most likely.   NSAIDs, ice, rest.  Podiatry referral if no improvement in 2 weeks.    Updated Medication List Outpatient Encounter Prescriptions as of 11/24/2012  Medication Sig Dispense Refill  . aspirin 81 MG tablet Take 81 mg by mouth daily.      . metoprolol tartrate (LOPRESSOR) 25 MG tablet Take 1 tablet (25 mg total) by mouth  2 (two) times daily.  30 tablet  5  . Multiple Vitamin (MULTIVITAMIN) tablet Take 1 tablet by mouth daily.      Marland Kitchen triamterene-hydrochlorothiazide (MAXZIDE-25) 37.5-25 MG per tablet Take 0.5 tablets by mouth daily.      . [DISCONTINUED] TDaP (BOOSTRIX) 5-2.5-18.5 LF-MCG/0.5 injection Inject 0.5 mLs into the muscle once.  0.5 mL  0  . calcium-vitamin D (OSCAL WITH D) 250-125 MG-UNIT per tablet Take 1 tablet by mouth daily.      . Cholecalciferol (VITAMIN D3) 1000 UNITS CAPS Take by mouth.      . meloxicam (MOBIC) 15 MG tablet Take 1 tablet (15 mg total) by mouth daily.  30 tablet  1  . [DISCONTINUED] amoxicillin-clavulanate (AUGMENTIN) 875-125 MG per tablet Take 1 tablet by mouth 2 (two) times daily.  14 tablet  0  . [DISCONTINUED]  conjugated estrogens (PREMARIN) vaginal cream Place 1 g vaginally 3 (three) times a week.      . [DISCONTINUED] diclofenac (VOLTAREN) 75 MG EC tablet Take 1 tablet (75 mg total) by mouth 2 (two) times daily.  60 tablet  3  . [DISCONTINUED] fish oil-omega-3 fatty acids 1000 MG capsule Take 2 g by mouth daily.      . [DISCONTINUED] traMADol (ULTRAM) 50 MG tablet Take 1 tablet (50 mg total) by mouth 2 (two) times daily as needed for pain.  60 tablet  2   No facility-administered encounter medications on file as of 11/24/2012.     No orders of the defined types were placed in this encounter.    No Follow-up on file.

## 2012-11-24 NOTE — Patient Instructions (Addendum)
I believe your foot pain is coming from a strained tendon on the top of your foot and on the side  I am prescribing a once daily anti inflammatory called meloxicam .  You can take this once a day with tylenol if needed BUT NOT WITH MOTRIN OR ALEVE because they are too similar  Elevate you leg and apply ice for 15 minutes after walking if needed for swelling   If your pain does not improve in 2 weeks we will have you see a podiatrist

## 2012-11-25 ENCOUNTER — Encounter: Payer: Self-pay | Admitting: Internal Medicine

## 2012-11-25 DIAGNOSIS — M25579 Pain in unspecified ankle and joints of unspecified foot: Secondary | ICD-10-CM | POA: Insufficient documentation

## 2012-11-25 NOTE — Assessment & Plan Note (Addendum)
Exam consistent with ligament strain on dorsum of foot, extensor hallicus longus most likely.   NSAIDs, ice, rest.  Podiatry referral if no improvement in 2 weeks.

## 2013-01-14 ENCOUNTER — Other Ambulatory Visit: Payer: Self-pay | Admitting: Internal Medicine

## 2013-05-21 DIAGNOSIS — H2510 Age-related nuclear cataract, unspecified eye: Secondary | ICD-10-CM | POA: Diagnosis not present

## 2013-06-22 ENCOUNTER — Encounter: Payer: Self-pay | Admitting: Adult Health

## 2013-06-22 ENCOUNTER — Ambulatory Visit (INDEPENDENT_AMBULATORY_CARE_PROVIDER_SITE_OTHER): Payer: Medicare Other | Admitting: Adult Health

## 2013-06-22 VITALS — BP 102/60 | HR 64 | Temp 98.4°F | Resp 16 | Ht 64.0 in | Wt 145.8 lb

## 2013-06-22 DIAGNOSIS — G8929 Other chronic pain: Secondary | ICD-10-CM

## 2013-06-22 DIAGNOSIS — R21 Rash and other nonspecific skin eruption: Secondary | ICD-10-CM | POA: Diagnosis not present

## 2013-06-22 DIAGNOSIS — R1031 Right lower quadrant pain: Secondary | ICD-10-CM | POA: Diagnosis not present

## 2013-06-22 LAB — CBC WITH DIFFERENTIAL/PLATELET
BASOS ABS: 0 10*3/uL (ref 0.0–0.1)
Basophils Relative: 0.4 % (ref 0.0–3.0)
EOS ABS: 0.1 10*3/uL (ref 0.0–0.7)
Eosinophils Relative: 1.1 % (ref 0.0–5.0)
HEMATOCRIT: 41.1 % (ref 36.0–46.0)
HEMOGLOBIN: 13.7 g/dL (ref 12.0–15.0)
LYMPHS ABS: 1.3 10*3/uL (ref 0.7–4.0)
Lymphocytes Relative: 21.9 % (ref 12.0–46.0)
MCHC: 33.4 g/dL (ref 30.0–36.0)
MCV: 95.2 fl (ref 78.0–100.0)
MONO ABS: 0.3 10*3/uL (ref 0.1–1.0)
MONOS PCT: 5.5 % (ref 3.0–12.0)
NEUTROS ABS: 4.3 10*3/uL (ref 1.4–7.7)
Neutrophils Relative %: 71.1 % (ref 43.0–77.0)
Platelets: 160 10*3/uL (ref 150.0–400.0)
RBC: 4.32 Mil/uL (ref 3.87–5.11)
RDW: 12.3 % (ref 11.5–15.5)
WBC: 6.1 10*3/uL (ref 4.0–10.5)

## 2013-06-22 LAB — BASIC METABOLIC PANEL
BUN: 26 mg/dL — ABNORMAL HIGH (ref 6–23)
CALCIUM: 9.5 mg/dL (ref 8.4–10.5)
CO2: 31 meq/L (ref 19–32)
CREATININE: 1 mg/dL (ref 0.4–1.2)
Chloride: 103 mEq/L (ref 96–112)
GFR: 55.3 mL/min — AB (ref >60.00)
Glucose, Bld: 91 mg/dL (ref 70–99)
Potassium: 4.6 mEq/L (ref 3.5–5.1)
Sodium: 140 mEq/L (ref 135–145)

## 2013-06-22 NOTE — Patient Instructions (Signed)
  Please have your blood drawn prior to leaving the office.  I will contact you with the results once they are available.  Please take benadryl 12.5 mg - 25 mg every 8 hours. Also take zantac 150 mg twice a day. Take both of these for 4 days.   Please call if no improvement in symptoms.

## 2013-06-22 NOTE — Progress Notes (Signed)
Pre visit review using our clinic review tool, if applicable. No additional management support is needed unless otherwise documented below in the visit note. 

## 2013-06-22 NOTE — Progress Notes (Signed)
Patient ID: Tina Silva, female   DOB: November 27, 1934, 79 y.o.   MRN: [id number]   Subjective:    Patient ID: Tina Silva, female    DOB: [birth year], 79 y.o.   MRN: [id number]  HPI Is and 79 year old female who presents to clinic with 2 concerns:  1. She has a rash on bilateral lower extremity that she first noticed a couple of days ago. She notices the rash is worse first thing in the morning. She has not taken any new medications, prescribed or over-the-counter. No new lotions, soaps, perfumes or body washes. The rash is not painful or itchy. She does not have any respiratory or other symptoms reported. She denies any insect bites.  2. patient has intermittent pain in her right lower cord current. She reports status post hysterectomy and removal of an ovary. She does not remember which ovary was removed. Denies constipation, diarrhea, nausea, vomiting, blood in stool. She is afebrile. There is no fatigue.   Past Medical History  Diagnosis Date  . Hypertension     Current Outpatient Prescriptions on File Prior to Visit  Medication Sig Dispense Refill  . aspirin 81 MG tablet Take 81 mg by mouth daily.      . calcium-vitamin D (OSCAL WITH D) 250-125 MG-UNIT per tablet Take 1 tablet by mouth daily.      . metoprolol tartrate (LOPRESSOR) 25 MG tablet TAKE ONE TABLET TWICE A DAY  60 tablet  5  . Multiple Vitamin (MULTIVITAMIN) tablet Take 1 tablet by mouth daily.      Marland Kitchen triamterene-hydrochlorothiazide (MAXZIDE-25) 37.5-25 MG per tablet Take 0.5 tablets by mouth daily.       No current facility-administered medications on file prior to visit.     Review of Systems  Constitutional: Negative.  Negative for fatigue.  HENT: Negative.  Negative for postnasal drip, rhinorrhea and sore throat.   Eyes: Negative.   Respiratory: Negative.  Negative for cough, shortness of breath and wheezing.   Cardiovascular: Negative.  Negative for chest pain, palpitations and leg swelling.    Gastrointestinal: Positive for abdominal pain (right lower quadrant, intermittent). Negative for nausea, vomiting, diarrhea, constipation, blood in stool and abdominal distention.  Endocrine: Negative.   Genitourinary: Negative.   Musculoskeletal: Negative.   Skin: Positive for rash (bilateral lower extremities).  Allergic/Immunologic: Negative.   Neurological: Negative.  Negative for dizziness, weakness and headaches.  Hematological: Negative.   Psychiatric/Behavioral: Negative.        Objective:  BP 102/60  Pulse 64  Temp(Src) 98.4 F (36.9 C) (Oral)  Resp 16  Ht 5\' 4"  (1.626 m)  Wt 145 lb 12 oz (66.112 kg)  BMI 25.01 kg/m2  SpO2 97%   Physical Exam  Constitutional: She is oriented to person, place, and time. She appears well-developed and well-nourished. No distress.  HENT:  Head: Normocephalic and atraumatic.  Eyes: Conjunctivae and EOM are normal.  Neck: Normal range of motion. Neck supple.  Cardiovascular: Normal rate and regular rhythm.   Pulmonary/Chest: Effort normal. No respiratory distress.  Abdominal: Soft. Bowel sounds are normal. She exhibits no distension and no mass. There is no tenderness. There is no rebound and no guarding.  No pain with deep palpation  Musculoskeletal: Normal range of motion.  No pain of lower abdomen with abduction of right leg, hip rotation  Neurological: She is alert and oriented to person, place, and time. She has normal reflexes. Coordination normal.  Skin: Rash noted.  Flat, diffuse rash. Most evident on her  feet. You have to look very closely to see this rash. She has an area of erythema on her right lateral knee that appears to be insect bite; however, pt denies any ticks or insect bites.  Psychiatric: She has a normal mood and affect. Her behavior is normal. Judgment and thought content normal.      Assessment & Plan:   1. Rash Unknown etiology. No other symptoms or distress. Check labs. Start benadryl and zantac.  - CBC  with Differential - Basic metabolic panel  2. Abdominal pain, chronic, right lower quadrant Suspect her pain may be muscular in origin. She has one ovary left but is not sure which one. I am sending her for a pelvic ultrasound to r/o any unusual masses. - US Pelvis Complete; Future - US Transvaginal Non-OB; Future

## 2013-06-23 ENCOUNTER — Encounter: Payer: Self-pay | Admitting: Adult Health

## 2013-06-25 ENCOUNTER — Ambulatory Visit: Payer: Self-pay | Admitting: Adult Health

## 2013-06-25 DIAGNOSIS — G8929 Other chronic pain: Secondary | ICD-10-CM | POA: Diagnosis not present

## 2013-06-25 DIAGNOSIS — Z90710 Acquired absence of both cervix and uterus: Secondary | ICD-10-CM | POA: Diagnosis not present

## 2013-06-25 DIAGNOSIS — R1031 Right lower quadrant pain: Secondary | ICD-10-CM | POA: Diagnosis not present

## 2013-06-28 ENCOUNTER — Encounter: Payer: Self-pay | Admitting: Adult Health

## 2013-06-30 ENCOUNTER — Telehealth: Payer: Self-pay | Admitting: Adult Health

## 2013-06-30 NOTE — Telephone Encounter (Signed)
Ultrasound was normal

## 2013-07-05 ENCOUNTER — Telehealth: Payer: Self-pay | Admitting: *Deleted

## 2013-07-05 DIAGNOSIS — R5383 Other fatigue: Secondary | ICD-10-CM

## 2013-07-05 DIAGNOSIS — E785 Hyperlipidemia, unspecified: Secondary | ICD-10-CM

## 2013-07-05 DIAGNOSIS — R5381 Other malaise: Secondary | ICD-10-CM

## 2013-07-05 NOTE — Telephone Encounter (Signed)
Pt is coming in tomorrow what labs and dx?  

## 2013-07-06 ENCOUNTER — Other Ambulatory Visit (INDEPENDENT_AMBULATORY_CARE_PROVIDER_SITE_OTHER): Payer: Medicare Other

## 2013-07-06 DIAGNOSIS — R5383 Other fatigue: Secondary | ICD-10-CM | POA: Diagnosis not present

## 2013-07-06 DIAGNOSIS — R5381 Other malaise: Secondary | ICD-10-CM | POA: Diagnosis not present

## 2013-07-06 DIAGNOSIS — E785 Hyperlipidemia, unspecified: Secondary | ICD-10-CM

## 2013-07-06 LAB — CBC WITH DIFFERENTIAL/PLATELET
BASOS ABS: 0 10*3/uL (ref 0.0–0.1)
Basophils Relative: 0.4 % (ref 0.0–3.0)
EOS PCT: 1.8 % (ref 0.0–5.0)
Eosinophils Absolute: 0.1 10*3/uL (ref 0.0–0.7)
HCT: 41.5 % (ref 36.0–46.0)
HEMOGLOBIN: 14.1 g/dL (ref 12.0–15.0)
LYMPHS PCT: 24.2 % (ref 12.0–46.0)
Lymphs Abs: 1.3 10*3/uL (ref 0.7–4.0)
MCHC: 33.8 g/dL (ref 30.0–36.0)
MCV: 93.8 fl (ref 78.0–100.0)
MONOS PCT: 5.6 % (ref 3.0–12.0)
Monocytes Absolute: 0.3 10*3/uL (ref 0.1–1.0)
NEUTROS ABS: 3.7 10*3/uL (ref 1.4–7.7)
Neutrophils Relative %: 68 % (ref 43.0–77.0)
Platelets: 157 10*3/uL (ref 150.0–400.0)
RBC: 4.43 Mil/uL (ref 3.87–5.11)
RDW: 12.4 % (ref 11.5–15.5)
WBC: 5.5 10*3/uL (ref 4.0–10.5)

## 2013-07-06 LAB — COMPREHENSIVE METABOLIC PANEL
ALT: 16 U/L (ref 0–35)
AST: 20 U/L (ref 0–37)
Albumin: 3.9 g/dL (ref 3.5–5.2)
Alkaline Phosphatase: 57 U/L (ref 39–117)
BUN: 21 mg/dL (ref 6–23)
CALCIUM: 9.3 mg/dL (ref 8.4–10.5)
CHLORIDE: 104 meq/L (ref 96–112)
CO2: 30 mEq/L (ref 19–32)
CREATININE: 1 mg/dL (ref 0.4–1.2)
GFR: 59.26 mL/min — ABNORMAL LOW (ref >60.00)
Glucose, Bld: 93 mg/dL (ref 70–99)
Potassium: 4.7 mEq/L (ref 3.5–5.1)
Sodium: 140 mEq/L (ref 135–145)
Total Bilirubin: 0.6 mg/dL (ref 0.2–1.2)
Total Protein: 6.8 g/dL (ref 6.0–8.3)

## 2013-07-06 LAB — LIPID PANEL
CHOL/HDL RATIO: 4
Cholesterol: 167 mg/dL (ref 0–200)
HDL: 43.4 mg/dL (ref >39.00)
LDL Cholesterol: 100 mg/dL — ABNORMAL HIGH (ref 0–99)
NonHDL: 123.6
TRIGLYCERIDES: 117 mg/dL (ref 0.0–149.0)
VLDL: 23.4 mg/dL (ref 0.0–40.0)

## 2013-07-06 LAB — TSH: TSH: 1.65 u[IU]/mL (ref 0.35–4.50)

## 2013-07-08 ENCOUNTER — Ambulatory Visit (INDEPENDENT_AMBULATORY_CARE_PROVIDER_SITE_OTHER): Payer: Medicare Other | Admitting: Internal Medicine

## 2013-07-08 ENCOUNTER — Encounter: Payer: Self-pay | Admitting: Internal Medicine

## 2013-07-08 VITALS — BP 122/70 | HR 71 | Temp 98.7°F | Resp 16 | Ht 63.75 in | Wt 144.5 lb

## 2013-07-08 DIAGNOSIS — M810 Age-related osteoporosis without current pathological fracture: Secondary | ICD-10-CM

## 2013-07-08 DIAGNOSIS — Z1239 Encounter for other screening for malignant neoplasm of breast: Secondary | ICD-10-CM | POA: Diagnosis not present

## 2013-07-08 DIAGNOSIS — R739 Hyperglycemia, unspecified: Secondary | ICD-10-CM

## 2013-07-08 DIAGNOSIS — R7309 Other abnormal glucose: Secondary | ICD-10-CM | POA: Diagnosis not present

## 2013-07-08 DIAGNOSIS — Z0000 Encounter for general adult medical examination without abnormal findings: Secondary | ICD-10-CM

## 2013-07-08 DIAGNOSIS — R21 Rash and other nonspecific skin eruption: Secondary | ICD-10-CM | POA: Diagnosis not present

## 2013-07-08 DIAGNOSIS — M25559 Pain in unspecified hip: Secondary | ICD-10-CM | POA: Diagnosis not present

## 2013-07-08 DIAGNOSIS — E785 Hyperlipidemia, unspecified: Secondary | ICD-10-CM

## 2013-07-08 DIAGNOSIS — M25551 Pain in right hip: Secondary | ICD-10-CM

## 2013-07-08 NOTE — Patient Instructions (Signed)
You had your annual Medicare wellness exam today  We will schedule your mammogram  In late July at [place] and your bone density test at [facility].   Your cholesterol,  thyroid , liver and kidney function are all normal.  You do not need any medication changes. Please continue your current medications and plan to repeat the labs in 6 months.    Regards,   Dr. Derrel Nip

## 2013-07-08 NOTE — Progress Notes (Signed)
Pre-visit discussion using our clinic review tool. No additional management support is needed unless otherwise documented below in the visit note.  

## 2013-07-08 NOTE — Progress Notes (Signed)
Patient ID: Tina Silva, female   DOB: 09/03/1934, 79 y.o.   MRN: [id number] The patient is here for annual Medicare wellness examination and management of other chronic and acute problems.   The risk factors are reflected in the social history.  The roster of all physicians providing medical care to patient - is listed in the Snapshot section of the chart.  Activities of daily living:  The patient is 100% independent in all ADLs: dressing, toileting, feeding as well as independent mobility  Home safety : The patient has smoke detectors in the home. They wear seatbelts.  There are no firearms at home. There is no violence in the home.   There is no risks for hepatitis, STDs or HIV. There is no   history of blood transfusion. They have no travel history to infectious disease endemic areas of the world.  The patient has seen their dentist in the last six month. They have seen their eye doctor in the last year. They admit to slight hearing difficulty with regard to whispered voices and some television programs.  They have deferred audiologic testing in the last year.  They do not  have excessive sun exposure. Discussed the need for sun protection: hats, long sleeves and use of sunscreen if there is significant sun exposure.   Diet: the importance of a healthy diet is discussed. They do have a healthy diet.  The benefits of regular aerobic exercise were discussed. She walks 4 times per week ,  20 minutes.   Depression screen: there are no signs or vegative symptoms of depression- irritability, change in appetite, anhedonia, sadness/tearfullness.  Cognitive assessment: the patient manages all their financial and personal affairs and is actively engaged. They could relate day,date,year and events; recalled 2/3 objects at 3 minutes; performed clock-face test normally.  The following portions of the patient's history were reviewed and updated as appropriate: allergies, current medications, past  family history, past medical history,  past surgical history, past social history  and problem list.  Visual acuity was not assessed per patient preference since she has regular follow up with her ophthalmologist. Hearing and body mass index were assessed and reviewed.   During the course of the visit the patient was educated and counseled about appropriate screening and preventive services including : fall prevention , diabetes screening, nutrition counseling, colorectal cancer screening, and recommended immunizations.    Objective:  BP 122/70  Pulse 71  Temp(Src) 98.7 F (37.1 C) (Oral)  Resp 16  Ht 5' 3.75" (1.619 m)  Wt 144 lb 8 oz (65.545 kg)  BMI 25.01 kg/m2  SpO2 98%   General appearance: alert, cooperative and appears stated age Head: Normocephalic, without obvious abnormality, atraumatic Eyes: conjunctivae/corneas clear. PERRL, EOM's intact. Fundi benign. Ears: normal TM's and external ear canals both ears Nose: Nares normal. Septum midline. Mucosa normal. No drainage or sinus tenderness. Throat: lips, mucosa, and tongue normal; teeth and gums normal Neck: no adenopathy, no carotid bruit, no JVD, supple, symmetrical, trachea midline and thyroid not enlarged, symmetric, no tenderness/mass/nodules Lungs: clear to auscultation bilaterally Breasts: normal appearance, no masses or tenderness Heart: regular rate and rhythm, S1, S2 normal, no murmur, click, rub or gallop Abdomen: soft, non-tender; bowel sounds normal; no masses,  no organomegaly Extremities: extremities normal, atraumatic, no cyanosis or edema Pulses: 2+ and symmetric Skin: Skin color, texture, turgor normal. No rashes or lesions Neurologic: Alert and oriented X 3, normal strength and tone. Normal symmetric reflexes. Normal coordination and gait.  Assessment and Plan:  Routine general medical examination at a health care facility Annual comprehensive exam was done including breast, excluding pelvic and PAP  smear. All screenings have been addressed .   Hyperlipidemia LDL goal < 100 Well controlled on current regimen. liver enzymes are stable, no changes today. Lab Results  Component Value Date   CHOL 167 07/06/2013   HDL 43.40 07/06/2013   LDLCALC 100* 07/06/2013   TRIG 117.0 07/06/2013   CHOLHDL 4 07/06/2013   Lab Results  Component Value Date   ALT 16 07/06/2013   AST 20 07/06/2013   ALKPHOS 57 07/06/2013   BILITOT 0.6 07/06/2013     Rash and nonspecific skin eruption Etiology unclear, referral to dermatology  Hyperglycemia She has no signs of DM by follow up labs and prior a1c was < 6.0  Lab Results  Component Value Date   HGBA1C 5.6 01/10/2012      Updated Medication List Outpatient Encounter Prescriptions as of 07/08/2013  Medication Sig  . aspirin 81 MG tablet Take 81 mg by mouth daily.  . calcium-vitamin D (OSCAL WITH D) 250-125 MG-UNIT per tablet Take 1 tablet by mouth daily.  . metoprolol tartrate (LOPRESSOR) 25 MG tablet TAKE ONE TABLET TWICE A DAY  . Multiple Vitamin (MULTIVITAMIN) tablet Take 1 tablet by mouth daily.  Marland Kitchen triamterene-hydrochlorothiazide (MAXZIDE-25) 37.5-25 MG per tablet Take 0.5 tablets by mouth daily.

## 2013-07-11 NOTE — Assessment & Plan Note (Signed)
She has no signs of DM by follow up labs and prior a1c was < 6.0  Lab Results  Component Value Date   HGBA1C 5.6 01/10/2012

## 2013-07-11 NOTE — Assessment & Plan Note (Signed)
Well controlled on current regimen. liver enzymes are stable, no changes today. Lab Results  Component Value Date   CHOL 167 07/06/2013   HDL 43.40 07/06/2013   LDLCALC 100* 07/06/2013   TRIG 117.0 07/06/2013   CHOLHDL 4 07/06/2013   Lab Results  Component Value Date   ALT 16 07/06/2013   AST 20 07/06/2013   ALKPHOS 57 07/06/2013   BILITOT 0.6 07/06/2013

## 2013-07-11 NOTE — Assessment & Plan Note (Signed)
Annual comprehensive exam was done including breast, excluding pelvic and PAP smear. All screenings have been addressed .  

## 2013-07-11 NOTE — Assessment & Plan Note (Signed)
Etiology unclear, referral to dermatology

## 2013-07-13 ENCOUNTER — Encounter: Payer: Self-pay | Admitting: Internal Medicine

## 2013-07-28 ENCOUNTER — Encounter: Payer: Self-pay | Admitting: Internal Medicine

## 2013-07-28 DIAGNOSIS — M25559 Pain in unspecified hip: Secondary | ICD-10-CM | POA: Diagnosis not present

## 2013-07-28 DIAGNOSIS — M25659 Stiffness of unspecified hip, not elsewhere classified: Secondary | ICD-10-CM | POA: Diagnosis not present

## 2013-07-28 DIAGNOSIS — R269 Unspecified abnormalities of gait and mobility: Secondary | ICD-10-CM | POA: Diagnosis not present

## 2013-07-28 DIAGNOSIS — IMO0001 Reserved for inherently not codable concepts without codable children: Secondary | ICD-10-CM | POA: Diagnosis not present

## 2013-08-05 ENCOUNTER — Other Ambulatory Visit: Payer: Self-pay | Admitting: Internal Medicine

## 2013-08-28 ENCOUNTER — Encounter: Payer: Self-pay | Admitting: Internal Medicine

## 2013-09-01 DIAGNOSIS — L819 Disorder of pigmentation, unspecified: Secondary | ICD-10-CM | POA: Diagnosis not present

## 2013-09-01 DIAGNOSIS — L565 Disseminated superficial actinic porokeratosis (DSAP): Secondary | ICD-10-CM | POA: Diagnosis not present

## 2013-09-01 DIAGNOSIS — L821 Other seborrheic keratosis: Secondary | ICD-10-CM | POA: Diagnosis not present

## 2013-09-01 DIAGNOSIS — L723 Sebaceous cyst: Secondary | ICD-10-CM | POA: Diagnosis not present

## 2013-09-02 ENCOUNTER — Other Ambulatory Visit: Payer: Self-pay | Admitting: *Deleted

## 2013-09-02 MED ORDER — TRIAMTERENE-HCTZ 37.5-25 MG PO TABS: 0.5000 | ORAL_TABLET | Freq: Every day | ORAL | Status: DC

## 2013-09-06 ENCOUNTER — Encounter: Payer: Self-pay | Admitting: *Deleted

## 2013-09-06 ENCOUNTER — Ambulatory Visit: Payer: Self-pay | Admitting: Internal Medicine

## 2013-09-06 DIAGNOSIS — M899 Disorder of bone, unspecified: Secondary | ICD-10-CM | POA: Diagnosis not present

## 2013-09-06 DIAGNOSIS — Z1231 Encounter for screening mammogram for malignant neoplasm of breast: Secondary | ICD-10-CM | POA: Diagnosis not present

## 2013-09-06 DIAGNOSIS — M949 Disorder of cartilage, unspecified: Secondary | ICD-10-CM | POA: Diagnosis not present

## 2013-09-06 LAB — HM MAMMOGRAPHY: HM Mammogram: NEGATIVE

## 2013-09-06 LAB — HM DEXA SCAN

## 2013-09-09 ENCOUNTER — Telehealth: Payer: Self-pay | Admitting: Internal Medicine

## 2013-09-09 DIAGNOSIS — M810 Age-related osteoporosis without current pathological fracture: Secondary | ICD-10-CM

## 2013-09-09 NOTE — Telephone Encounter (Signed)
Her bone density scores indicate that she has a significant risk of fracture in the next 10 years. We will discuss more at the next visit.

## 2013-09-09 NOTE — Assessment & Plan Note (Signed)
Her bone density scores indicate that she has a significant risk of fracture in the next 10 years. We will discuss more at the next visit.

## 2013-09-10 NOTE — Telephone Encounter (Signed)
Spoke with pt advised of MDs message.  Pt verbalized understanding. 

## 2013-10-12 ENCOUNTER — Ambulatory Visit (INDEPENDENT_AMBULATORY_CARE_PROVIDER_SITE_OTHER): Payer: Medicare Other | Admitting: Internal Medicine

## 2013-10-12 ENCOUNTER — Encounter: Payer: Self-pay | Admitting: Internal Medicine

## 2013-10-12 VITALS — BP 138/74 | HR 71 | Temp 98.5°F | Resp 16 | Ht 63.75 in | Wt 145.2 lb

## 2013-10-12 DIAGNOSIS — Z23 Encounter for immunization: Secondary | ICD-10-CM

## 2013-10-12 DIAGNOSIS — M810 Age-related osteoporosis without current pathological fracture: Secondary | ICD-10-CM

## 2013-10-12 DIAGNOSIS — J0100 Acute maxillary sinusitis, unspecified: Secondary | ICD-10-CM | POA: Diagnosis not present

## 2013-10-12 MED ORDER — RISEDRONATE SODIUM 35 MG PO TABS: 35.0000 mg | ORAL_TABLET | ORAL | Status: DC

## 2013-10-12 NOTE — Progress Notes (Signed)
Patient ID: Tina Silva, female   DOB: [birth year], 79 y.o.   MRN: [id number]   Patient Active Problem List   Diagnosis Date Noted  . Sinusitis, acute maxillary 10/14/2013  . Rash and nonspecific skin eruption 07/08/2013  . Pain in joint, ankle and foot 11/25/2012  . Routine general medical examination at a health care facility 06/30/2012  . Right hip pain 06/30/2012  . Hemorrhoid prolapse 01/01/2012  . Hyperglycemia 01/01/2012  . Osteoporosis, post-menopausal 09/01/2011  . Acid reflux 09/01/2011  . Hypertension 08/30/2011  . Hyperlipidemia LDL goal < 100 08/30/2011    Subjective:  CC:   Chief Complaint  Patient presents with  . Follow-up    bone density test    HPI:   Tina Silva is a 79 y.o. female who presents for  1) Follow up on DEXA scan.  History of osteoporosis ,  Has been on a drug holiday since 2012. From Actonel, which she was tolerating and had taken for nearly 10 years.  Repeat DEXA reflected progression of bone loss.   2) Sinus drainage and congestion since August 31st.  Drainage is finally clear today . Started with aching in left molars,  Used topical decongestants and saline lavage. . Has chronic postnasal drip and allergies    Past Medical History  Diagnosis Date  . Hypertension     Past Surgical History  Procedure Laterality Date  . Abdominal hysterectomy    . Appendectomy  1974  . Eye surgery  june 2013    cataract with lens,  dinglein left eye        The following portions of the patient's history were reviewed and updated as appropriate: Allergies, current medications, and problem list.    Review of Systems:   Patient denies headache, fevers, malaise, unintentional weight loss, skin rash, eye pain, sinus congestion and sinus pain, sore throat, dysphagia,  hemoptysis , cough, dyspnea, wheezing, chest pain, palpitations, orthopnea, edema, abdominal pain, nausea, melena, diarrhea, constipation, flank pain, dysuria, hematuria, urinary   Frequency, nocturia, numbness, tingling, seizures,  Focal weakness, Loss of consciousness,  Tremor, insomnia, depression, anxiety, and suicidal ideation.     History   Social History  . Marital Status: Single    Spouse Name: N/A    Number of Children: N/A  . Years of Education: N/A   Occupational History  . Not on file.   Social History Main Topics  . Smoking status: Never Smoker   . Smokeless tobacco: Never Used  . Alcohol Use: No  . Drug Use: No  . Sexual Activity: Not on file   Other Topics Concern  . Not on file   Social History Narrative  . No narrative on file    Objective:  Filed Vitals:   10/12/13 1506  BP: 138/74  Pulse: 71  Temp: 98.5 F (36.9 C)  Resp: 16     General appearance: alert, cooperative and appears stated age Ears: normal TM's and external ear canals both ears Throat: lips, mucosa, and tongue normal; teeth and gums normal Neck: no adenopathy, no carotid bruit, supple, symmetrical, trachea midline and thyroid not enlarged, symmetric, no tenderness/mass/nodules Back: symmetric, no curvature. ROM normal. No CVA tenderness. Lungs: clear to auscultation bilaterally Heart: regular rate and rhythm, S1, S2 normal, no murmur, click, rub or gallop Abdomen: soft, non-tender; bowel sounds normal; no masses,  no organomegaly Pulses: 2+ and symmetric Skin: Skin color, texture, turgor normal. No rashes or lesions Lymph nodes: Cervical, supraclavicular, and axillary nodes normal.  Assessment and Plan:  Osteoporosis, post-menopausal She took a drug holiday from Actonel as advised in January of  2012.  Given her worsening T scores,  We Will resume .  History of right wrist fracture with fall in garage, remote,  in the 80's  Sinusitis, acute maxillary 4 week history of symptoms with gradual improvement using only OT C meds.  Given the risk of c dificile colits and her normal HEENT exam,  I advised her to avoid abx unless she has a relapse   A total of  25bminutes of face to face time was spent with patient more than half of which was spent in counselling and coordination of care   Updated Medication List Outpatient Encounter Prescriptions as of 10/12/2013  Medication Sig  . aspirin 81 MG tablet Take 81 mg by mouth daily.  . calcium-vitamin D (OSCAL WITH D) 250-125 MG-UNIT per tablet Take 1 tablet by mouth daily.  . metoprolol tartrate (LOPRESSOR) 25 MG tablet TAKE ONE TABLET TWICE A DAY  . Multiple Vitamin (MULTIVITAMIN) tablet Take 1 tablet by mouth daily.  Marland Kitchen triamterene-hydrochlorothiazide (MAXZIDE-25) 37.5-25 MG per tablet Take 0.5 tablets by mouth daily.  . risedronate (ACTONEL) 35 MG tablet Take 1 tablet (35 mg total) by mouth every 7 (seven) days. with water on empty stomach, nothing by mouth or lie down for next 30 minutes.     Orders Placed This Encounter  Procedures  . Pneumococcal conjugate vaccine 13-valent    Return in about 6 months (around 04/12/2014).

## 2013-10-12 NOTE — Patient Instructions (Addendum)
Your sinus infection appears to be clearing up on its own   Try 25 mg benadryl to help dry up your sinuses (dipenhydramine) at bedtime  Keep flushing with sterile saline or neil's sinus rinse.  Call if headache or ear pain develops.    We are resuming actonel  Once a week for osteoporosis .  I recommend getting the majority of your calcium and Vitamin D  through diet rather than supplements given the recent association of calcium supplements with increased coronary artery calcium scores  Unsweetened almond/coconut milk is a great low calorie low carb way to increase your dietary calcium and vitamin D.  Try the blue Jackquline Bosch  YOU  Need between 1200 and 1800 mg of calcium daily . One glass of almond mild is 433 mg calcium and only 30 calories, and cholesterol free!!!

## 2013-10-12 NOTE — Progress Notes (Signed)
Pre-visit discussion using our clinic review tool. No additional management support is needed unless otherwise documented below in the visit note.  

## 2013-10-12 NOTE — Assessment & Plan Note (Addendum)
She took a drug holiday from Actonel as advised in January of  2012.  Given her worsening T scores,  We Will resume .  History of right wrist fracture with fall in garage, remote,  in the 80's

## 2013-10-14 ENCOUNTER — Encounter: Payer: Self-pay | Admitting: Internal Medicine

## 2013-10-14 DIAGNOSIS — J0100 Acute maxillary sinusitis, unspecified: Secondary | ICD-10-CM | POA: Insufficient documentation

## 2013-10-14 NOTE — Assessment & Plan Note (Addendum)
4 week history of symptoms with gradual improvement using only OT C meds.  Given the risk of c dificile colits and her normal HEENT exam,  I advised her to avoid abx unless she has a relapse

## 2013-11-01 ENCOUNTER — Other Ambulatory Visit: Payer: Self-pay | Admitting: Internal Medicine

## 2013-12-24 ENCOUNTER — Other Ambulatory Visit: Payer: Self-pay | Admitting: Internal Medicine

## 2014-02-14 ENCOUNTER — Telehealth: Payer: Self-pay | Admitting: Internal Medicine

## 2014-02-14 NOTE — Telephone Encounter (Signed)
The patient's insurance has denied her Risedron SOD tab 35 mg . She is needing prior authorization . Paper work given to triage.

## 2014-02-15 DIAGNOSIS — Z7689 Persons encountering health services in other specified circumstances: Secondary | ICD-10-CM

## 2014-02-15 NOTE — Telephone Encounter (Signed)
PA filled out and faxed to [facility] as requested awaiting answer form copied for billing and sent to scan.

## 2014-02-24 ENCOUNTER — Telehealth: Payer: Self-pay | Admitting: Internal Medicine

## 2014-02-24 ENCOUNTER — Encounter: Payer: Self-pay | Admitting: Internal Medicine

## 2014-02-24 NOTE — Telephone Encounter (Signed)
Sent mychart

## 2014-02-24 NOTE — Telephone Encounter (Signed)
Her insurance has decided to no longer cover  Risedronate foe osteoporosis treatment . She has to try a covered drug , which is weekly alendronate. I will send a 30 day supply to pharmacy if she agrees

## 2014-02-25 MED ORDER — IBANDRONATE SODIUM 150 MG PO TABS: 150.0000 mg | ORAL_TABLET | ORAL | Status: DC

## 2014-02-25 NOTE — Telephone Encounter (Signed)
Generic Boniva rx authorized to try,  please make sure it printed

## 2014-02-25 NOTE — Telephone Encounter (Signed)
Called to pharmacy 

## 2014-02-26 ENCOUNTER — Other Ambulatory Visit: Payer: Self-pay | Admitting: Internal Medicine

## 2014-03-08 ENCOUNTER — Telehealth: Payer: Self-pay | Admitting: Internal Medicine

## 2014-03-08 DIAGNOSIS — M810 Age-related osteoporosis without current pathological fracture: Secondary | ICD-10-CM

## 2014-03-08 MED ORDER — ALENDRONATE SODIUM 70 MG PO TABS: 70.0000 mg | ORAL_TABLET | ORAL | Status: DC

## 2014-03-08 NOTE — Telephone Encounter (Signed)
E mail sent,  Will send alendronate to pharmacy as substitute for risedronate which her insurance will not cover anymore

## 2014-03-08 NOTE — Assessment & Plan Note (Signed)
Will send alendronate to pharmacy as substitute for risedronate which her insurance will not cover anymore

## 2014-03-08 NOTE — Telephone Encounter (Signed)
Pt dropped off letter from ins about rx. Insurance will not pay for Risedronate. Pt is asking for another rx. Letter in Dr. Lupita Dawn box/msn

## 2014-03-09 MED ORDER — IBANDRONATE SODIUM 150 MG PO TABS: 150.0000 mg | ORAL_TABLET | ORAL | Status: DC

## 2014-03-09 NOTE — Telephone Encounter (Signed)
Please call her pharmacy and discontinue the alendronate and ask them to fill the ibandronate instead that I sent again

## 2014-03-09 NOTE — Telephone Encounter (Signed)
See mychart message from patient and advise please.

## 2014-03-10 NOTE — Telephone Encounter (Signed)
Patient and pharmacy notified

## 2014-04-30 DIAGNOSIS — J309 Allergic rhinitis, unspecified: Secondary | ICD-10-CM | POA: Diagnosis not present

## 2014-05-22 NOTE — Op Note (Signed)
PATIENT NAME:  Tina Silva, Tina Silva MR#:  [id number] DATE OF BIRTH:  [birth year]  DATE OF PROCEDURE:  07/15/2011  PREOPERATIVE DIAGNOSIS:  Cataract, left eye.    POSTOPERATIVE DIAGNOSIS:  Cataract, left eye.  PROCEDURE PERFORMED:  Extracapsular cataract extraction using phacoemulsification with placement of an Alcon [id number], 20.0-diopter posterior chamber lens, serial #[id number].   SURGEON:  Loura Back. Gurkirat Basher, MD  ASSISTANT:  None.  ANESTHESIA:  4% lidocaine and 0.75% Marcaine in a 50/50 mixture with 10 units/mL of Hylenex added, given as a peribulbar.   ANESTHESIOLOGIST:  Elyse Hsu, MD  COMPLICATIONS:  None.  ESTIMATED BLOOD LOSS:  Less than 1 ml.  DESCRIPTION OF PROCEDURE:  The patient was brought to the operating room and given a peribulbar block.  The patient was then prepped and draped in the usual fashion.  The vertical rectus muscles were imbricated using 5-0 silk sutures.  These sutures were then clamped to the sterile drapes as bridle sutures.  A limbal peritomy was performed extending two clock hours and hemostasis was obtained with cautery.  A partial thickness scleral groove was made at the surgical limbus and dissected anteriorly in a lamellar dissection using an Alcon crescent knife.  The anterior chamber was entered supero-temporally with a Superblade and through the lamellar dissection with a 2.6 mm keratome.  DisCoVisc was used to replace the aqueous and a continuous tear capsulorrhexis was carried out.  Hydrodissection and hydrodelineation were carried out with balanced salt and a 27 gauge canula.  The nucleus was rotated to confirm the effectiveness of the hydrodissection.  Phacoemulsification was carried out using a divide-and-conquer technique.  Total ultrasound time was 1 minute and 19 seconds with an average power of 14.7 percent for CDE 19.77.  Irrigation/aspiration was used to remove the residual cortex.  DisCoVisc was used to inflate the capsule and the internal  incision was enlarged to 3 mm with the crescent knife.  The intraocular lens was folded and inserted into the capsular bag using the AcrySert delivery system. Irrigation/aspiration was used to remove the residual DisCoVisc.  Miostat was injected into the anterior chamber through the paracentesis track to inflate the anterior chamber and induce miosis.  The wound was checked for leaks and none were found. The conjunctiva was closed with cautery and the bridle sutures were removed.  Two drops of 0.3% Vigamox were placed on the eye.   An eye shield was placed on the eye.  The patient was discharged to the recovery room in good condition. ____________________________ Loura Back Mery Guadalupe, MD sad:slb D: 07/15/2011 13:04:44 ET T: 07/15/2011 13:11:56 ET JOB#: [id number]  cc: Remo Lipps A. Zakari Couchman, MD, <Dictator> Martie Lee MD ELECTRONICALLY SIGNED 07/19/2011 12:07

## 2014-06-10 DIAGNOSIS — Z961 Presence of intraocular lens: Secondary | ICD-10-CM | POA: Diagnosis not present

## 2014-06-10 DIAGNOSIS — H26492 Other secondary cataract, left eye: Secondary | ICD-10-CM | POA: Diagnosis not present

## 2014-06-26 ENCOUNTER — Emergency Department
Admission: EM | Admit: 2014-06-26 | Discharge: 2014-06-26 | Disposition: A | Discharge status: Home / Self Care | Payer: Medicare Other | Attending: Emergency Medicine | Admitting: Emergency Medicine

## 2014-06-26 ENCOUNTER — Encounter: Payer: Self-pay | Admitting: Emergency Medicine

## 2014-06-26 DIAGNOSIS — M542 Cervicalgia: Secondary | ICD-10-CM | POA: Diagnosis present

## 2014-06-26 DIAGNOSIS — Z79899 Other long term (current) drug therapy: Secondary | ICD-10-CM | POA: Diagnosis not present

## 2014-06-26 DIAGNOSIS — I10 Essential (primary) hypertension: Secondary | ICD-10-CM | POA: Insufficient documentation

## 2014-06-26 DIAGNOSIS — Z7982 Long term (current) use of aspirin: Secondary | ICD-10-CM | POA: Insufficient documentation

## 2014-06-26 DIAGNOSIS — M436 Torticollis: Secondary | ICD-10-CM | POA: Diagnosis not present

## 2014-06-26 MED ORDER — NAPROXEN 500 MG PO TABS
ORAL_TABLET | ORAL | Status: AC
  Filled 2014-06-26: qty 1

## 2014-06-26 MED ORDER — NAPROXEN 500 MG PO TABS
500.0000 mg | ORAL_TABLET | Freq: Once | ORAL | Status: AC
  Administered 2014-06-26: 500 mg via ORAL

## 2014-06-26 MED ORDER — DIAZEPAM 2 MG PO TABS: 2.0000 mg | ORAL_TABLET | Freq: Two times a day (BID) | ORAL | Status: DC | PRN

## 2014-06-26 NOTE — ED Notes (Signed)
Charted by shannin Rn

## 2014-06-26 NOTE — ED Notes (Signed)
Patient to ED with c/o waking this morning with pain to right back side of neck, feels stiff. Patient reports having difficulty with turning head to right and moving arm past head to brush hair.

## 2014-06-26 NOTE — Discharge Instructions (Signed)
Torticollis, Acute You have suddenly (acutely) developed a twisted neck (torticollis). This is usually a self-limited condition. CAUSES  Acute torticollis may be caused by malposition, trauma or infection. Most commonly, acute torticollis is caused by sleeping in an awkward position. Torticollis may also be caused by the flexion, extension or twisting of the neck muscles beyond their normal position. Sometimes, the exact cause may not be known. SYMPTOMS  Usually, there is pain and limited movement of the neck. Your neck may twist to one side. DIAGNOSIS  The diagnosis is often made by physical examination. X-rays, CT scans or MRIs may be done if there is a history of trauma or concern of infection. TREATMENT  For a common, stiff neck that develops during sleep, treatment is focused on relaxing the contracted neck muscle. Medications (including shots) may be used to treat the problem. Most cases resolve in several days. Torticollis usually responds to conservative physical therapy. If left untreated, the shortened and spastic neck muscle can cause deformities in the face and neck. Rarely, surgery is required. HOME CARE INSTRUCTIONS   Use over-the-counter and prescription medications as directed by your caregiver.  Do stretching exercises and massage the neck as directed by your caregiver.  Follow up with physical therapy if needed and as directed by your caregiver. SEEK IMMEDIATE MEDICAL CARE IF:   You develop difficulty breathing or noisy breathing (stridor).  You drool, develop trouble swallowing or have pain with swallowing.  You develop numbness or weakness in the hands or feet.  You have changes in speech or vision.  You have problems with urination or bowel movements.  You have difficulty walking.  You have a fever.  You have increased pain. MAKE SURE YOU:   Understand these instructions.  Will watch your condition.  Will get help right away if you are not doing well or  get worse. Document Released: 01/12/2000 Document Revised: 04/08/2011 Document Reviewed: 02/22/2009 [facility] Patient Information 2015 [place], [place]. This information is not intended to replace advice given to you by your health care provider. Make sure you discuss any questions you have with your health care provider.  Apply ice and/or moist heat to reduce symptoms.  Take the prescription meds as directed.  Follow-up with Dr. Derrel Nip as needed.

## 2014-06-27 NOTE — ED Provider Notes (Signed)
[facility] Emergency Department Provider Note ?____________________________________________ ? Time seen: 1848? I have reviewed the triage vital signs and the nursing notes. ________ HISTORY ? Chief Complaint Torticollis  HPI  Tina Silva is a 80 y.o. female who describes severe right-sided neck pain upon awakening this morning. She describes initially woke up at about 1 AM to go to the bathroom, but denies any difficulty at that time. She awoke again at 5 AM and reports severe tenderness and spasm to the back of the right neck at the base of the skull. She denies any injury or trauma in the interim. She denies any upper extremity paresthesias prescription changes or weakness. She notes pain is increased with turning the neck to the right as well as some discomfort when she reaches above shoulder level. She is here for evaluation and management of her symptoms, she rates her pain at a 7 out of 10 at triage.  Past Medical History  Diagnosis Date  . Hypertension     Patient Active Problem List   Diagnosis Date Noted  . Sinusitis, acute maxillary 10/14/2013  . Rash and nonspecific skin eruption 07/08/2013  . Pain in joint, ankle and foot 11/25/2012  . Routine general medical examination at a health care facility 06/30/2012  . Right hip pain 06/30/2012  . Hemorrhoid prolapse 01/01/2012  . Hyperglycemia 01/01/2012  . Osteoporosis, post-menopausal 09/01/2011  . Acid reflux 09/01/2011  . Hypertension 08/30/2011  . Hyperlipidemia LDL goal < 100 08/30/2011   ? Past Surgical History  Procedure Laterality Date  . Abdominal hysterectomy    . Appendectomy  1974  . Eye surgery  june 2013    cataract with lens,  dinglein left eye    ? Current Outpatient Rx  Name  Route  Sig  Dispense  Refill  . aspirin 81 MG tablet   Oral   Take 81 mg by mouth daily.         . calcium-vitamin D (OSCAL WITH D) 250-125 MG-UNIT per tablet   Oral   Take 1 tablet by mouth  daily.         . diazepam (VALIUM) 2 MG tablet   Oral   Take 1 tablet (2 mg total) by mouth every 12 (twelve) hours as needed for muscle spasms.   10 tablet   0   . ibandronate (BONIVA) 150 MG tablet   Oral   Take 1 tablet (150 mg total) by mouth every 30 (thirty) days. Take in the morning with a full glass of water, on an empty stomach, and do not take anything else by mouth or lie down for the next 30 min.   1 tablet   11   . metoprolol tartrate (LOPRESSOR) 25 MG tablet      TAKE ONE TABLET TWICE A DAY   60 tablet   5   . Multiple Vitamin (MULTIVITAMIN) tablet   Oral   Take 1 tablet by mouth daily.         Marland Kitchen triamterene-hydrochlorothiazide (MAXZIDE-25) 37.5-25 MG per tablet      TAKE ONE-HALF TABLET DAILY   30 tablet   2    ? Allergies Ciprofloxacin; Flagyl; Iodine; and Sudafed ? Family History  Problem Relation Age of Onset  . Heart disease Mother   . Cancer Sister 17    colon ca,  . Cancer Sister     AML  ? Social History History  Substance Use Topics  . Smoking status: Never Smoker   .  Smokeless tobacco: Never Used  . Alcohol Use: No   Review of Systems  Constitutional: Negative for fever. HEENT: Negative for head trauma, visual changes, sore throat. Cardiovascular: Negative for chest pain. Respiratory: Negative for shortness of breath. Musculoskeletal: Negative for back pain. Positive for neck pain Skin: Negative for rash. Neurological: Negative for headaches, focal weakness or numbness.  10-point ROS otherwise negative. ____________________________________________  PHYSICAL EXAM:  VITAL SIGNS: ED Triage Vitals  Enc Vitals Group     BP 06/26/14 1642 142/67 mmHg     Pulse Rate 06/26/14 1642 69     Resp 06/26/14 1642 18     Temp 06/26/14 1642 98.5 F (36.9 C)     Temp Source 06/26/14 1642 Oral     SpO2 06/26/14 1642 100 %     Weight 06/26/14 1642 145 lb (65.772 kg)     Height 06/26/14 1642 5\' 4"  (1.626 m)     Head Cir --      Peak  Flow --      Pain Score 06/26/14 1644 7     Pain Loc --      Pain Edu? --      Excl. in [place]? --    Constitutional: Alert and oriented. Well appearing and in no distress. HEENT:Normocephalic and atraumatic.  PERRL. Normal extraocular movements.  No congestion/rhinnorhea. Mucous membranes are moist. Neck: Supple. No cervical lymphadenopathy. Palpable tenderness to the right occiput. Some increased muscle rigidity noted to posterolateral neck on the right.  Cardiovascular: Normal rate, regular rhythm. No murmurs, rubs, or gallops. Normal and symmetric distal pulses are present in upper extremities.  Respiratory: Normal respiratory effort without tachypnea. Breath sounds are clear and equal bilaterally. No wheezes/rales/rhonchi. Musculoskeletal: Nontender with normal range of motion in all extremities. Decreased neck ROM noted with acute spasms on the right side. Neurologic:  Normal speech and language. CN II-XII grossly intact. Normal UE DTRs bilaterally.  Skin:  Skin is warm, dry and intact. No rash noted. Psychiatric: Mood and affect are normal. Patient exhibits appropriate insight and judgment. _____________ PROCEDURES ? Procedure(s) performed: naproxen 500 mg PO  Critical Care performed: No ______________________________________________________ INITIAL IMPRESSION / ASSESSMENT AND PLAN / ED COURSE  Acute neck spasms. Normal neuromuscular exam. Treatment with naproxen and valium. Suggest ice therapy. Follow-up with Dr. Derrel Nip if symptoms persist.  ____________________________________________ FINAL CLINICAL IMPRESSION(S) / ED DIAGNOSES?  Final diagnoses:  Torticollis, acute      Melvenia Needles, PA-C 06/27/14 [phone]

## 2014-07-04 ENCOUNTER — Other Ambulatory Visit: Payer: Self-pay | Admitting: Internal Medicine

## 2014-07-04 NOTE — Telephone Encounter (Signed)
Tina Silva on need for appt

## 2014-07-22 ENCOUNTER — Ambulatory Visit: Payer: Medicare Other | Admitting: Internal Medicine

## 2014-08-10 ENCOUNTER — Ambulatory Visit (INDEPENDENT_AMBULATORY_CARE_PROVIDER_SITE_OTHER): Payer: Medicare Other | Admitting: Internal Medicine

## 2014-08-10 ENCOUNTER — Encounter: Payer: Self-pay | Admitting: Internal Medicine

## 2014-08-10 VITALS — BP 110/70 | HR 69 | Temp 98.4°F | Ht 63.75 in | Wt 146.5 lb

## 2014-08-10 DIAGNOSIS — Z79899 Other long term (current) drug therapy: Secondary | ICD-10-CM | POA: Diagnosis not present

## 2014-08-10 DIAGNOSIS — E785 Hyperlipidemia, unspecified: Secondary | ICD-10-CM

## 2014-08-10 DIAGNOSIS — E559 Vitamin D deficiency, unspecified: Secondary | ICD-10-CM | POA: Diagnosis not present

## 2014-08-10 DIAGNOSIS — M810 Age-related osteoporosis without current pathological fracture: Secondary | ICD-10-CM | POA: Diagnosis not present

## 2014-08-10 DIAGNOSIS — M25512 Pain in left shoulder: Secondary | ICD-10-CM

## 2014-08-10 DIAGNOSIS — L601 Onycholysis: Secondary | ICD-10-CM | POA: Diagnosis not present

## 2014-08-10 DIAGNOSIS — I10 Essential (primary) hypertension: Secondary | ICD-10-CM

## 2014-08-10 MED ORDER — METOPROLOL TARTRATE 25 MG PO TABS: 25.0000 mg | ORAL_TABLET | Freq: Two times a day (BID) | ORAL | Status: DC

## 2014-08-10 MED ORDER — ETODOLAC ER 600 MG PO TB24: 600.0000 mg | ORAL_TABLET | Freq: Every day | ORAL | Status: DC

## 2014-08-10 MED ORDER — TRIAMTERENE-HCTZ 37.5-25 MG PO TABS: 0.5000 | ORAL_TABLET | Freq: Every day | ORAL | Status: DC

## 2014-08-10 NOTE — Progress Notes (Signed)
Subjective:  Patient ID: Tina Silva, female    DOB: [birth year]  Age: 80 y.o. MRN: [id number]  CC: The primary encounter diagnosis was Long-term use of high-risk medication. Diagnoses of Onycholysis, Vitamin D deficiency, Osteoporosis, post-menopausal, Pain in joint, shoulder region, left, Essential hypertension, and Hyperlipidemia with target LDL less than 100 were also pertinent to this visit.  HPI Tina Silva presents for follow up on hypertension, hyperlipidemia, GERD and  osteoporosis  With resumption of Boniva at last visit  Sept 2015.  Stopped th he medicationt after 3 months due to multiple symptoms which occurred affter she started taking it:  1)  Joint pain : she reports persistent "creaking " in neck but no pain involving left shoulder.  Had an R visit for early morning neck pain that was so severe she could not turn head .  Treated after one week of symptoms for torticollis with valium     2) Osteoporosis:  Wants to resume therapy with Actonel  . Prolia i discussed since actonel ws $90/month   3) Shoulder pain: no history of trauma.  Pain is interfering with sleep.   4) onycholysis: etiology unclear.  Outpatient Prescriptions Prior to Visit  Medication Sig Dispense Refill  . aspirin 81 MG tablet Take 81 mg by mouth daily.    . Multiple Vitamin (MULTIVITAMIN) tablet Take 1 tablet by mouth daily.    . metoprolol tartrate (LOPRESSOR) 25 MG tablet TAKE ONE TABLET TWICE A DAY 60 tablet 0  . triamterene-hydrochlorothiazide (MAXZIDE-25) 37.5-25 MG per tablet TAKE ONE-HALF TABLET DAILY 30 tablet 2  . calcium-vitamin D (OSCAL WITH D) 250-125 MG-UNIT per tablet Take 1 tablet by mouth daily.    . diazepam (VALIUM) 2 MG tablet Take 1 tablet (2 mg total) by mouth every 12 (twelve) hours as needed for muscle spasms. (Patient not taking: Reported on 08/10/2014) 10 tablet 0  . ibandronate (BONIVA) 150 MG tablet Take 1 tablet (150 mg total) by mouth every 30 (thirty) days. Take in the  morning with a full glass of water, on an empty stomach, and do not take anything else by mouth or lie down for the next 30 min. (Patient not taking: Reported on 08/10/2014) 1 tablet 11   No facility-administered medications prior to visit.    Review of Systems;  Patient denies headache, fevers, malaise, unintentional weight loss, skin rash, eye pain, sinus congestion and sinus pain, sore throat, dysphagia,  hemoptysis , cough, dyspnea, wheezing, chest pain, palpitations, orthopnea, edema, abdominal pain, nausea, melena, diarrhea, constipation, flank pain, dysuria, hematuria, urinary  Frequency, nocturia, numbness, tingling, seizures,  Focal weakness, Loss of consciousness,  Tremor, insomnia, depression, anxiety, and suicidal ideation.      Objective:  BP 110/70 mmHg  Pulse 69  Temp(Src) 98.4 F (36.9 C) (Oral)  Ht 5' 3.75" (1.619 m)  Wt 146 lb 8 oz (66.452 kg)  BMI 25.35 kg/m2  SpO2 96%  BP Readings from Last 3 Encounters:  08/10/14 110/70  06/26/14 142/67  10/12/13 138/74    Wt Readings from Last 3 Encounters:  08/10/14 146 lb 8 oz (66.452 kg)  06/26/14 145 lb (65.772 kg)  10/12/13 145 lb 4 oz (65.885 kg)    General appearance: alert, cooperative and appears stated age Ears: normal TM's and external ear canals both ears Throat: lips, mucosa, and tongue normal; teeth and gums normal Neck: no adenopathy, no carotid bruit, supple, symmetrical, trachea midline and thyroid not enlarged, symmetric, no tenderness/mass/nodules Back: symmetric, no curvature. ROM  normal. No CVA tenderness. Lungs: clear to auscultation bilaterally Heart: regular rate and rhythm, S1, S2 normal, no murmur, click, rub or gallop Abdomen: soft, non-tender; bowel sounds normal; no masses,  no organomegaly Pulses: 2+ and symmetric Skin: Skin color, texture, turgor normal. No rashes or lesions Lymph nodes: Cervical, supraclavicular, and axillary nodes normal.  Lab Results  Component Value Date   HGBA1C  5.6 01/10/2012    Lab Results  Component Value Date   CREATININE 1.06 08/10/2014   CREATININE 1.0 07/06/2013   CREATININE 1.0 06/22/2013    Lab Results  Component Value Date   WBC 5.5 07/06/2013   HGB 14.1 07/06/2013   HCT 41.5 07/06/2013   PLT 157.0 07/06/2013   GLUCOSE 91 08/10/2014   CHOL 167 07/06/2013   TRIG 117.0 07/06/2013   HDL 43.40 07/06/2013   LDLCALC 100* 07/06/2013   ALT 16 08/10/2014   AST 18 08/10/2014   NA 140 08/10/2014   K 4.2 08/10/2014   CL 104 08/10/2014   CREATININE 1.06 08/10/2014   BUN 23 08/10/2014   CO2 32 08/10/2014   TSH 2.070 08/10/2014   HGBA1C 5.6 01/10/2012    No results found.  Assessment & Plan:   Problem List Items Addressed This Visit      Unprioritized   Hypertension    Well controlled on current regimen. Renal function stable, no changes today. BP 110/70 mmHg  Pulse 69  Temp(Src) 98.4 F (36.9 C) (Oral)  Ht 5' 3.75" (1.619 m)  Wt 146 lb 8 oz (66.452 kg)  BMI 25.35 kg/m2  SpO2 96%   Lab Results  Component Value Date   CREATININE 1.06 08/10/2014   Lab Results  Component Value Date   NA 140 08/10/2014   K 4.2 08/10/2014   CL 104 08/10/2014   CO2 32 08/10/2014         Relevant Medications   metoprolol tartrate (LOPRESSOR) 25 MG tablet   triamterene-hydrochlorothiazide (MAXZIDE-25) 37.5-25 MG per tablet   Hyperlipidemia with target LDL less than 100    Well controlled on current regimen. liver enzymes are stable, no changes today. Lab Results  Component Value Date   CHOL 167 07/06/2013   HDL 43.40 07/06/2013   LDLCALC 100* 07/06/2013   TRIG 117.0 07/06/2013   CHOLHDL 4 07/06/2013   Lab Results  Component Value Date   ALT 16 08/10/2014   AST 18 08/10/2014   ALKPHOS 52 08/10/2014   BILITOT 0.4 08/10/2014           Relevant Medications   metoprolol tartrate (LOPRESSOR) 25 MG tablet   triamterene-hydrochlorothiazide (MAXZIDE-25) 37.5-25 MG per tablet   Osteoporosis, post-menopausal    She has  not tolerated  Alendronate due to worsening joint pain  and would like to resume  risedronate which her insurance will not cover anymore  .  Will try to get Prolia authorized.          Pain in joint, shoulder region    Trial of lodine for bursitis       Other Visit Diagnoses    Long-term use of high-risk medication    -  Primary    Relevant Orders    Comp Met (CMET) (Completed)    Onycholysis        Relevant Orders    T4 AND TSH (Completed)    Vitamin D deficiency        Relevant Orders    Vit D  25 hydroxy (rtn osteoporosis monitoring) (Completed)  I have discontinued Ms. Basher's calcium-vitamin D, ibandronate, and diazepam. I have also changed her metoprolol tartrate and triamterene-hydrochlorothiazide. Additionally, I am having her start on etodolac. Lastly, I am having her maintain her multivitamin and aspirin.  Meds ordered this encounter  Medications  . metoprolol tartrate (LOPRESSOR) 25 MG tablet    Sig: Take 1 tablet (25 mg total) by mouth 2 (two) times daily.    Dispense:  60 tablet    Refill:  5  . triamterene-hydrochlorothiazide (MAXZIDE-25) 37.5-25 MG per tablet    Sig: Take 0.5 tablets by mouth daily.    Dispense:  30 tablet    Refill:  2  . etodolac (LODINE XL) 600 MG 24 hr tablet    Sig: Take 1 tablet (600 mg total) by mouth daily.    Dispense:  30 tablet    Refill:  2    Medications Discontinued During This Encounter  Medication Reason  . ibandronate (BONIVA) 150 MG tablet Side effect (s)  . diazepam (VALIUM) 2 MG tablet No longer needed (for PRN medications)  . calcium-vitamin D (OSCAL WITH D) 250-125 MG-UNIT per tablet Patient has not taken in last 30 days  . metoprolol tartrate (LOPRESSOR) 25 MG tablet Reorder  . triamterene-hydrochlorothiazide (MAXZIDE-25) 37.5-25 MG per tablet Reorder    Follow-up: No Follow-up on file.   Crecencio Mc, MD

## 2014-08-10 NOTE — Progress Notes (Signed)
Pre visit review using our clinic review tool, if applicable. No additional management support is needed unless otherwise documented below in the visit note. 

## 2014-08-10 NOTE — Patient Instructions (Addendum)
I recommend considering Prolia (denosumab) for your osteoporosis management  I have prescribed Etodolac ER , an anti inflammatory for your shoulder pain.  One tablet daily,  Do not combine with advil , motrin , or aleve ,   Denosumab injection What is this medicine? DENOSUMAB (den oh sue mab) slows bone breakdown. Prolia is used to treat osteoporosis in women after menopause and in men. Delton See is used to prevent bone fractures and other bone problems caused by cancer bone metastases. Delton See is also used to treat giant cell tumor of the bone. This medicine may be used for other purposes; ask your health care provider or pharmacist if you have questions. COMMON BRAND NAME(S): Prolia, XGEVA What should I tell my health care provider before I take this medicine? They need to know if you have any of these conditions: -dental disease -eczema -infection or history of infections -kidney disease or on dialysis -low blood calcium or vitamin D -malabsorption syndrome -scheduled to have surgery or tooth extraction -taking medicine that contains denosumab -thyroid or parathyroid disease -an unusual reaction to denosumab, other medicines, foods, dyes, or preservatives -pregnant or trying to get pregnant -breast-feeding How should I use this medicine? This medicine is for injection under the skin. It is given by a health care professional in a hospital or clinic setting. If you are getting Prolia, a special MedGuide will be given to you by the pharmacist with each prescription and refill. Be sure to read this information carefully each time. For Prolia, talk to your pediatrician regarding the use of this medicine in children. Special care may be needed. For Delton See, talk to your pediatrician regarding the use of this medicine in children. While this drug may be prescribed for children as young as 13 years for selected conditions, precautions do apply. Overdosage: If you think you've taken too much of this  medicine contact a poison control center or emergency room at once. Overdosage: If you think you have taken too much of this medicine contact a poison control center or emergency room at once. NOTE: This medicine is only for you. Do not share this medicine with others. What if I miss a dose? It is important not to miss your dose. Call your doctor or health care professional if you are unable to keep an appointment. What may interact with this medicine? Do not take this medicine with any of the following medications: -other medicines containing denosumab This medicine may also interact with the following medications: -medicines that suppress the immune system -medicines that treat cancer -steroid medicines like prednisone or cortisone This list may not describe all possible interactions. Give your health care provider a list of all the medicines, herbs, non-prescription drugs, or dietary supplements you use. Also tell them if you smoke, drink alcohol, or use illegal drugs. Some items may interact with your medicine. What should I watch for while using this medicine? Visit your doctor or health care professional for regular checks on your progress. Your doctor or health care professional may order blood tests and other tests to see how you are doing. Call your doctor or health care professional if you get a cold or other infection while receiving this medicine. Do not treat yourself. This medicine may decrease your body's ability to fight infection. You should make sure you get enough calcium and vitamin D while you are taking this medicine, unless your doctor tells you not to. Discuss the foods you eat and the vitamins you take with your health care  professional. See your dentist regularly. Brush and floss your teeth as directed. Before you have any dental work done, tell your dentist you are receiving this medicine. Do not become pregnant while taking this medicine or for 5 months after stopping  it. Women should inform their doctor if they wish to become pregnant or think they might be pregnant. There is a potential for serious side effects to an unborn child. Talk to your health care professional or pharmacist for more information. What side effects may I notice from receiving this medicine? Side effects that you should report to your doctor or health care professional as soon as possible: -allergic reactions like skin rash, itching or hives, swelling of the face, lips, or tongue -breathing problems -chest pain -fast, irregular heartbeat -feeling faint or lightheaded, falls -fever, chills, or any other sign of infection -muscle spasms, tightening, or twitches -numbness or tingling -skin blisters or bumps, or is dry, peels, or red -slow healing or unexplained pain in the mouth or jaw -unusual bleeding or bruising Side effects that usually do not require medical attention (Report these to your doctor or health care professional if they continue or are bothersome.): -muscle pain -stomach upset, gas This list may not describe all possible side effects. Call your doctor for medical advice about side effects. You may report side effects to FDA at 1-800-FDA-1088. Where should I keep my medicine? This medicine is only given in a clinic, doctor's office, or other health care setting and will not be stored at home. NOTE: This sheet is a summary. It may not cover all possible information. If you have questions about this medicine, talk to your doctor, pharmacist, or health care provider.  2015, Elsevier/Gold Standard. (2011-07-15 12:37:47)

## 2014-08-11 LAB — COMPREHENSIVE METABOLIC PANEL
ALBUMIN: 3.9 g/dL (ref 3.5–5.2)
ALT: 16 U/L (ref 0–35)
AST: 18 U/L (ref 0–37)
Alkaline Phosphatase: 52 U/L (ref 39–117)
BILIRUBIN TOTAL: 0.4 mg/dL (ref 0.2–1.2)
BUN: 23 mg/dL (ref 6–23)
CO2: 32 meq/L (ref 19–32)
Calcium: 9.8 mg/dL (ref 8.4–10.5)
Chloride: 104 mEq/L (ref 96–112)
Creatinine, Ser: 1.06 mg/dL (ref 0.40–1.20)
GFR: 53.34 mL/min — AB (ref >60.00)
Glucose, Bld: 91 mg/dL (ref 70–99)
POTASSIUM: 4.2 meq/L (ref 3.5–5.1)
Sodium: 140 mEq/L (ref 135–145)
Total Protein: 6.7 g/dL (ref 6.0–8.3)

## 2014-08-11 LAB — T4 AND TSH
T4, Total: 4.9 ug/dL (ref 4.5–12.0)
TSH: 2.07 u[IU]/mL (ref 0.450–4.500)

## 2014-08-11 LAB — VITAMIN D 25 HYDROXY (VIT D DEFICIENCY, FRACTURES): VITD: 26.44 ng/mL — AB (ref 30.00–100.00)

## 2014-08-12 DIAGNOSIS — M25519 Pain in unspecified shoulder: Secondary | ICD-10-CM | POA: Insufficient documentation

## 2014-08-12 NOTE — Assessment & Plan Note (Addendum)
She has not tolerated  Alendronate due to worsening joint pain  and would like to resume  risedronate which her insurance will not cover anymore  .  Will try to get Prolia authorized.

## 2014-08-12 NOTE — Assessment & Plan Note (Signed)
Well controlled on current regimen. liver enzymes are stable, no changes today. Lab Results  Component Value Date   CHOL 167 07/06/2013   HDL 43.40 07/06/2013   LDLCALC 100* 07/06/2013   TRIG 117.0 07/06/2013   CHOLHDL 4 07/06/2013   Lab Results  Component Value Date   ALT 16 08/10/2014   AST 18 08/10/2014   ALKPHOS 52 08/10/2014   BILITOT 0.4 08/10/2014

## 2014-08-12 NOTE — Assessment & Plan Note (Signed)
Well controlled on current regimen. Renal function stable, no changes today. BP 110/70 mmHg  Pulse 69  Temp(Src) 98.4 F (36.9 C) (Oral)  Ht 5' 3.75" (1.619 m)  Wt 146 lb 8 oz (66.452 kg)  BMI 25.35 kg/m2  SpO2 96%   Lab Results  Component Value Date   CREATININE 1.06 08/10/2014   Lab Results  Component Value Date   NA 140 08/10/2014   K 4.2 08/10/2014   CL 104 08/10/2014   CO2 32 08/10/2014

## 2014-08-12 NOTE — Assessment & Plan Note (Signed)
Trial of lodine for bursitis

## 2014-08-14 ENCOUNTER — Encounter: Payer: Self-pay | Admitting: Internal Medicine

## 2014-09-06 ENCOUNTER — Encounter: Payer: Self-pay | Admitting: Internal Medicine

## 2014-09-06 ENCOUNTER — Ambulatory Visit (INDEPENDENT_AMBULATORY_CARE_PROVIDER_SITE_OTHER): Payer: Medicare Other | Admitting: Internal Medicine

## 2014-09-06 ENCOUNTER — Telehealth: Payer: Self-pay | Admitting: *Deleted

## 2014-09-06 VITALS — BP 108/64 | HR 75 | Temp 98.2°F | Resp 14 | Ht 63.75 in | Wt 144.0 lb

## 2014-09-06 DIAGNOSIS — M533 Sacrococcygeal disorders, not elsewhere classified: Secondary | ICD-10-CM

## 2014-09-06 DIAGNOSIS — M810 Age-related osteoporosis without current pathological fracture: Secondary | ICD-10-CM

## 2014-09-06 DIAGNOSIS — Z800 Family history of malignant neoplasm of digestive organs: Secondary | ICD-10-CM

## 2014-09-06 MED ORDER — DIAZEPAM 2 MG PO TABS: 2.0000 mg | ORAL_TABLET | Freq: Three times a day (TID) | ORAL | Status: DC | PRN

## 2014-09-06 MED ORDER — RISEDRONATE SODIUM 35 MG PO TABS: 35.0000 mg | ORAL_TABLET | ORAL | Status: DC

## 2014-09-06 MED ORDER — ETODOLAC 500 MG PO TABS: 500.0000 mg | ORAL_TABLET | Freq: Two times a day (BID) | ORAL | Status: DC

## 2014-09-06 NOTE — Progress Notes (Signed)
Subjective:  Patient ID: Tina Silva, female    DOB: 02/24/1934  Age: 80 y.o. MRN: [id number]  CC: The primary encounter diagnosis was Osteoporosis, post-menopausal. Diagnoses of Sacroiliac pain and Family history of colon cancer requiring screening colonoscopy were also pertinent to this visit.  HPI Tina Silva presents for evaluation of back pain, mid back . The pain started several weeks ago,  She has been sleeping in a cot or in a RECLINER in her sister's hospital room .Marland Kitchen The pain is aggravated bu bending and twisting .  Certain position changes cause the pain to feel like a stabbing knife.   FH of colon CA:  Her sister is in the hospital dying of metastatic colon cancer  ( diagnosed in 2011 at age 34 )  .  Patient is due for 5 yr follow up in 2017.    Osteoporosis .  Has decided not to take the Prolia bc she read about the side effects .  Wants to start actonel weekly.     Outpatient Prescriptions Prior to Visit  Medication Sig Dispense Refill  . aspirin 81 MG tablet Take 81 mg by mouth daily.    . metoprolol tartrate (LOPRESSOR) 25 MG tablet Take 1 tablet (25 mg total) by mouth 2 (two) times daily. 60 tablet 5  . Multiple Vitamin (MULTIVITAMIN) tablet Take 1 tablet by mouth daily.    Marland Kitchen triamterene-hydrochlorothiazide (MAXZIDE-25) 37.5-25 MG per tablet Take 0.5 tablets by mouth daily. 30 tablet 2  . etodolac (LODINE XL) 600 MG 24 hr tablet Take 1 tablet (600 mg total) by mouth daily. (Patient not taking: Reported on 09/06/2014) 30 tablet 2   No facility-administered medications prior to visit.    Review of Systems;  Patient denies headache, fevers, malaise, unintentional weight loss, skin rash, eye pain, sinus congestion and sinus pain, sore throat, dysphagia,  hemoptysis , cough, dyspnea, wheezing, chest pain, palpitations, orthopnea, edema, abdominal pain, nausea, melena, diarrhea, constipation, flank pain, dysuria, hematuria, urinary  Frequency, nocturia, numbness, tingling,  seizures,  Focal weakness, Loss of consciousness,  Tremor, insomnia, depression, anxiety, and suicidal ideation.      Objective:  BP 108/64 mmHg  Pulse 75  Temp(Src) 98.2 F (36.8 C) (Oral)  Resp 14  Ht 5' 3.75" (1.619 m)  Wt 144 lb (65.318 kg)  BMI 24.92 kg/m2  SpO2 95%  BP Readings from Last 3 Encounters:  09/06/14 108/64  08/10/14 110/70  06/26/14 142/67    Wt Readings from Last 3 Encounters:  09/06/14 144 lb (65.318 kg)  08/10/14 146 lb 8 oz (66.452 kg)  06/26/14 145 lb (65.772 kg)    General appearance: alert, cooperative and appears stated age Ears: normal TM's and external ear canals both ears Throat: lips, mucosa, and tongue normal; teeth and gums normal Neck: no adenopathy, no carotid bruit, supple, symmetrical, trachea midline and thyroid not enlarged, symmetric, no tenderness/mass/nodules Back: symmetric, no curvature. ROM SI joint on the left  Lungs: clear to auscultation bilaterally Heart: regular rate and rhythm, S1, S2 normal, no murmur, click, rub or gallop Abdomen: soft, non-tender; bowel sounds normal; no masses,  no organomegaly Pulses: 2+ and symmetric Skin: Skin color, texture, turgor normal. No rashes or lesions Lymph nodes: Cervical, supraclavicular, and axillary nodes normal.  Lab Results  Component Value Date   HGBA1C 5.6 01/10/2012    Lab Results  Component Value Date   CREATININE 1.06 08/10/2014   CREATININE 1.0 07/06/2013   CREATININE 1.0 06/22/2013    Lab  Results  Component Value Date   WBC 5.5 07/06/2013   HGB 14.1 07/06/2013   HCT 41.5 07/06/2013   PLT 157.0 07/06/2013   GLUCOSE 91 08/10/2014   CHOL 167 07/06/2013   TRIG 117.0 07/06/2013   HDL 43.40 07/06/2013   LDLCALC 100* 07/06/2013   ALT 16 08/10/2014   AST 18 08/10/2014   NA 140 08/10/2014   K 4.2 08/10/2014   CL 104 08/10/2014   CREATININE 1.06 08/10/2014   BUN 23 08/10/2014   CO2 32 08/10/2014   TSH 2.070 08/10/2014   HGBA1C 5.6 01/10/2012    No results  found.  Assessment & Plan:   Problem List Items Addressed This Visit      Unprioritized   Osteoporosis, post-menopausal - Primary    treated with alendronate  With treatment stopped due to intolerance.  Patient willing to try risedronate or Prolia.  History of  remote wrist fracture.       Relevant Medications   risedronate (ACTONEL) 35 MG tablet   Sacroiliac pain    Accompanied by muscle spasm caused by sleeping  Changes for the last several weeks.  Prn ibuprofen and tylenol.  Muscle relaxer with valium 2 mg       Relevant Medications   etodolac (LODINE) 500 MG tablet   Family history of colon cancer requiring screening colonoscopy    Sister diagnosed at age 52.  Patient's last colonoscopy was in 2012.  She wiil need follow up in 2017       Relevant Orders   Ambulatory referral to Gastroenterology      I have discontinued Ms. Lippold's etodolac. I am also having her start on etodolac, risedronate, and diazepam. Additionally, I am having her maintain her multivitamin, aspirin, metoprolol tartrate, and triamterene-hydrochlorothiazide.  Meds ordered this encounter  Medications  . etodolac (LODINE) 500 MG tablet    Sig: Take 1 tablet (500 mg total) by mouth 2 (two) times daily.    Dispense:  60 tablet    Refill:  3  . risedronate (ACTONEL) 35 MG tablet    Sig: Take 1 tablet (35 mg total) by mouth every 7 (seven) days. with water on empty stomach, nothing by mouth or lie down for next 30 minutes.    Dispense:  12 tablet    Refill:  3  . diazepam (VALIUM) 2 MG tablet    Sig: Take 1 tablet (2 mg total) by mouth every 8 (eight) hours as needed for muscle spasms.    Dispense:  30 tablet    Refill:  2    Medications Discontinued During This Encounter  Medication Reason  . etodolac (LODINE XL) 600 MG 24 hr tablet     Follow-up: No Follow-up on file.   Crecencio Mc, MD

## 2014-09-06 NOTE — Telephone Encounter (Signed)
No problem.  We will initiate the prior authorization needed for Prolia.    Rose, I would like to request prior authorization for  Prolia.  She has a history of osteoporosis and a history of a  Remote right wrist fracture .  She was treated with risedronate followed by  alendronate until  2012 when a drug holiday was advised due to worsening bone and joint pain.   her t scores have worsened since the holiday.  Risedronate is covered but the copay is too high.

## 2014-09-06 NOTE — Telephone Encounter (Signed)
Pt called states the Actonel is too expensive, she is requesting advise about Prolia options and cost.  Please advise

## 2014-09-06 NOTE — Patient Instructions (Signed)
Your low back pain is due to muscle spasm.    You can start using the etodolac two times daily along with tylenol 500 mg up to 4 tablets daily  Use the generic valium for muscle spasm up to 3 daily   Ice and heat also help  You can resume Actonel once a week for your osteoporosis prevention  Back Pain, Adult Low back pain is very common. About 1 in 5 people have back pain.The cause of low back pain is rarely dangerous. The pain often gets better over time.About half of people with a sudden onset of back pain feel better in just 2 weeks. About 8 in 10 people feel better by 6 weeks.  CAUSES Some common causes of back pain include:  Strain of the muscles or ligaments supporting the spine.  Wear and tear (degeneration) of the spinal discs.  Arthritis.  Direct injury to the back. DIAGNOSIS Most of the time, the direct cause of low back pain is not known.However, back pain can be treated effectively even when the exact cause of the pain is unknown.Answering your caregiver's questions about your overall health and symptoms is one of the most accurate ways to make sure the cause of your pain is not dangerous. If your caregiver needs more information, he or she may order lab work or imaging tests (X-rays or MRIs).However, even if imaging tests show changes in your back, this usually does not require surgery. HOME CARE INSTRUCTIONS For many people, back pain returns.Since low back pain is rarely dangerous, it is often a condition that people can learn to [facility] their own.   Remain active. It is stressful on the back to sit or stand in one place. Do not sit, drive, or stand in one place for more than 30 minutes at a time. Take short walks on level surfaces as soon as pain allows.Try to increase the length of time you walk each day.  Do not stay in bed.Resting more than 1 or 2 days can delay your recovery.  Do not avoid exercise or work.Your body is made to move.It is not dangerous to  be active, even though your back may hurt.Your back will likely heal faster if you return to being active before your pain is gone.  Pay attention to your body when you bend and lift. Many people have less discomfortwhen lifting if they bend their knees, keep the load close to their bodies,and avoid twisting. Often, the most comfortable positions are those that put less stress on your recovering back.  Find a comfortable position to sleep. Use a firm mattress and lie on your side with your knees slightly bent. If you lie on your back, put a pillow under your knees.  Only take over-the-counter or prescription medicines as directed by your caregiver. Over-the-counter medicines to reduce pain and inflammation are often the most helpful.Your caregiver may prescribe muscle relaxant drugs.These medicines help dull your pain so you can more quickly return to your normal activities and healthy exercise.  Put ice on the injured area.  Put ice in a plastic bag.  Place a towel between your skin and the bag.  Leave the ice on for 15-20 minutes, 03-04 times a day for the first 2 to 3 days. After that, ice and heat may be alternated to reduce pain and spasms.  Ask your caregiver about trying back exercises and gentle massage. This may be of some benefit.  Avoid feeling anxious or stressed.Stress increases muscle tension and can  worsen back pain.It is important to recognize when you are anxious or stressed and learn ways to manage it.Exercise is a great option. SEEK MEDICAL CARE IF:  You have pain that is not relieved with rest or medicine.  You have pain that does not improve in 1 week.  You have new symptoms.  You are generally not feeling well. SEEK IMMEDIATE MEDICAL CARE IF:   You have pain that radiates from your back into your legs.  You develop new bowel or bladder control problems.  You have unusual weakness or numbness in your arms or legs.  You develop nausea or  vomiting.  You develop abdominal pain.  You feel faint. Document Released: 01/14/2005 Document Revised: 07/16/2011 Document Reviewed: 05/18/2013 [facility] Patient Information 2015 [place], [place]. This information is not intended to replace advice given to you by your health care provider. Make sure you discuss any questions you have with your health care provider.

## 2014-09-07 DIAGNOSIS — Z800 Family history of malignant neoplasm of digestive organs: Secondary | ICD-10-CM | POA: Insufficient documentation

## 2014-09-07 DIAGNOSIS — M533 Sacrococcygeal disorders, not elsewhere classified: Secondary | ICD-10-CM | POA: Insufficient documentation

## 2014-09-07 NOTE — Assessment & Plan Note (Addendum)
Accompanied by muscle spasm caused by sleeping  Changes for the last several weeks.  Prn ibuprofen and tylenol.  Muscle relaxer with valium 2 mg

## 2014-09-07 NOTE — Assessment & Plan Note (Signed)
Sister diagnosed at age 78.  Patient's last colonoscopy was in 2012.  She wiil need follow up in 2017

## 2014-09-07 NOTE — Telephone Encounter (Signed)
I have electronically submitted pt's info for Prolia insurance verification and will notify you once I have a response. Thank you. °

## 2014-09-07 NOTE — Assessment & Plan Note (Signed)
treated with alendronate  With treatment stopped due to intolerance.  Patient willing to try risedronate or Prolia.  History of  remote wrist fracture.

## 2014-09-08 ENCOUNTER — Telehealth: Payer: Self-pay | Admitting: Internal Medicine

## 2014-09-08 NOTE — Telephone Encounter (Signed)
PA faxed to [facility] for Alendronate.

## 2014-09-13 ENCOUNTER — Telehealth: Payer: Self-pay | Admitting: Internal Medicine

## 2014-09-13 MED ORDER — RISEDRONATE SODIUM 35 MG PO TABS
35.0000 mg | ORAL_TABLET | ORAL | Status: DC
Start: 2014-09-13 — End: 2015-09-14

## 2014-09-13 NOTE — Telephone Encounter (Signed)
Patient notified and script faxed to pharmacy. 

## 2014-09-13 NOTE — Telephone Encounter (Signed)
actonel has been approved for nonformulary exception through Dec 2016.  I suggest she start it as the procedure for Boniva may take months .  rx printed

## 2014-09-16 ENCOUNTER — Encounter: Payer: Self-pay | Admitting: Internal Medicine

## 2014-09-19 NOTE — Telephone Encounter (Signed)
Spoke with pt she states she has already started the Actonel.

## 2014-09-19 NOTE — Telephone Encounter (Signed)
I have rec'd pt's insurance verification for Prolia.  Pt has an estimated responsibility of $0 plus $166 deductible, if not met.  Please make pt aware this an estimate and we will not know an exact amt until both insurance plans have paid.  I have sent a copy of the summary of benefits to be scanned into her chart.  If Tina Silva has not met her deductible and cannot afford $166 (or however much of her deductible isn't met) then please advise her to contact Prolia at [phone] and select option #1 to see if she qualifies for any of their assistance programs.   If she qualifies, they will instruct her how to proceed.  Please send me a note w/her actual injection date once she rec's is so that I can update the Prolia portal.  If you have any questions, please let me know.  Thank you.

## 2014-09-19 NOTE — Telephone Encounter (Signed)
Please let patient know that she can choose to start the pill that was approved last week instead of prolia  (actonel) if she prefers.  . But not both

## 2014-10-06 DIAGNOSIS — Z23 Encounter for immunization: Secondary | ICD-10-CM | POA: Diagnosis not present

## 2015-01-25 ENCOUNTER — Encounter: Payer: Self-pay | Admitting: *Deleted

## 2015-02-24 ENCOUNTER — Other Ambulatory Visit: Payer: Self-pay | Admitting: Internal Medicine

## 2015-03-14 ENCOUNTER — Ambulatory Visit (INDEPENDENT_AMBULATORY_CARE_PROVIDER_SITE_OTHER): Payer: Medicare Other

## 2015-03-14 VITALS — BP 118/66 | HR 73 | Temp 97.7°F | Resp 14 | Ht 64.0 in | Wt 146.4 lb

## 2015-03-14 DIAGNOSIS — Z0000 Encounter for general adult medical examination without abnormal findings: Secondary | ICD-10-CM

## 2015-03-14 NOTE — Patient Instructions (Addendum)
Ms. Fortuno,  Thank you for taking time to come for your Medicare Wellness Visit.  I appreciate your ongoing commitment to your health goals. Please review the following plan we discussed and let me know if I can assist you in the future.  Return in March for physical.  Hearing Loss Hearing loss is a partial or total loss of the ability to hear. This can be temporary or permanent, and it can happen in one or both ears. Hearing loss may be referred to as deafness. Medical care is necessary to treat hearing loss properly and to prevent the condition from getting worse. Your hearing may partially or completely come back, depending on what caused your hearing loss and how severe it is. In some cases, hearing loss is permanent. CAUSES Common causes of hearing loss include:   Too much wax in the ear canal.   Infection of the ear canal or middle ear.   Fluid in the middle ear.   Injury to the ear or surrounding area.   An object stuck in the ear.   Prolonged exposure to loud sounds, such as music.  Less common causes of hearing loss include:   Tumors in the ear.   Viral or bacterial infections, such as meningitis.   A hole in the eardrum (perforated eardrum).  Problems with the hearing nerve that sends signals between the brain and the ear.  Certain medicines.  SYMPTOMS  Symptoms of this condition may include:  Difficulty telling the difference between sounds.  Difficulty following a conversation when there is background noise.  Lack of response to sounds in your environment. This may be most noticeable when you do not respond to startling sounds.  Needing to turn up the volume on the television, radio, etc.  Ringing in the ears.  Dizziness.  Pain in the ears. DIAGNOSIS This condition is diagnosed based on a physical exam and a hearing test (audiometry). The audiometry test will be performed by a hearing specialist (audiologist). You may also be referred to an  ear, nose, and throat (ENT) specialist (otolaryngologist).  TREATMENT Treatment for recent onset of hearing loss may include:   Ear wax removal.   Being prescribed medicines to prevent infection (antibiotics).   Being prescribed medicines to reduce inflammation (corticosteroids).  HOME CARE INSTRUCTIONS  If you were prescribed an antibiotic medicine, take it as told by your health care provider. Do not stop taking the antibiotic even if you start to feel better.  Take over-the-counter and prescription medicines only as told by your health care provider.  Avoid loud noises.   Return to your normal activities as told by your health care provider. Ask your health care provider what activities are safe for you.  Keep all follow-up visits as told by your health care provider. This is important. SEEK MEDICAL CARE IF:   You feel dizzy.   You develop new symptoms.   You vomit or feel nauseous.   You have a fever.  SEEK IMMEDIATE MEDICAL CARE IF:  You develop sudden changes in your vision.   You have severe ear pain.   You have new or increased weakness.  You have a severe headache.   This information is not intended to replace advice given to you by your health care provider. Make sure you discuss any questions you have with your health care provider.   Document Released: 01/14/2005 Document Revised: 10/05/2014 Document Reviewed: 06/01/2014 Elsevier Interactive Patient Education [employer].

## 2015-03-14 NOTE — Progress Notes (Signed)
  I have reviewed the above information and agree with above.   Embree Brawley, MD 

## 2015-03-14 NOTE — Progress Notes (Signed)
Subjective:   Tina Silva is a 81 y.o. female who presents for Medicare Annual (Subsequent) preventive examination.  Review of Systems:  No ROS.  Medicare Wellness Visit.  Cardiac Risk Factors include: advanced age (>62men, >36 women);hypertension     Objective:     Vitals: BP 118/66 mmHg  Pulse 73  Temp(Src) 97.7 F (36.5 C) (Oral)  Resp 14  Ht 5\' 4"  (1.626 m)  Wt 146 lb 6.4 oz (66.407 kg)  BMI 25.12 kg/m2  SpO2 98%  LMP   Tobacco History  Smoking status  . Never Smoker   Smokeless tobacco  . Never Used     Counseling given: Not Answered   Past Medical History  Diagnosis Date  . Hypertension    Past Surgical History  Procedure Laterality Date  . Abdominal hysterectomy    . Appendectomy  1974  . Eye surgery  june 2013    cataract with lens,  dinglein left eye   . Tonsillectomy and adenoidectomy     Family History  Problem Relation Age of Onset  . Parkinson's disease Mother   . Cancer Sister     AML  . Diabetes Sister   . Heart disease Father   . Diabetes Paternal Aunt   . Cancer Maternal Grandmother    History  Sexual Activity  . Sexual Activity: No    Outpatient Encounter Prescriptions as of 03/14/2015  Medication Sig  . aspirin 81 MG tablet Take 81 mg by mouth daily.  . calcium-vitamin D (OSCAL WITH D) 500-200 MG-UNIT tablet Take 2 tablets by mouth.  . Cholecalciferol (VITAMIN D3) 1000 units CAPS Take 1 capsule by mouth.  . metoprolol tartrate (LOPRESSOR) 25 MG tablet TAKE ONE TABLET TWICE A DAY  . Multiple Vitamin (MULTIVITAMIN) tablet Take 1 tablet by mouth daily.  . risedronate (ACTONEL) 35 MG tablet Take 1 tablet (35 mg total) by mouth every 7 (seven) days. with water on empty stomach, nothing by mouth or lie down for next 30 minutes.  . triamterene-hydrochlorothiazide (MAXZIDE-25) 37.5-25 MG tablet TAKE ONE-HALF TABLET DAILY  . [DISCONTINUED] diazepam (VALIUM) 2 MG tablet Take 1 tablet (2 mg total) by mouth every 8 (eight) hours as  needed for muscle spasms.  . [DISCONTINUED] etodolac (LODINE) 500 MG tablet Take 1 tablet (500 mg total) by mouth 2 (two) times daily.   No facility-administered encounter medications on file as of 03/14/2015.    Activities of Daily Living In your present state of health, do you have any difficulty performing the following activities: 03/14/2015  Hearing? Y  Vision? N  Difficulty concentrating or making decisions? N  Walking or climbing stairs? N  Dressing or bathing? N  Doing errands, shopping? N  Preparing Food and eating ? N  Using the Toilet? N  In the past six months, have you accidently leaked urine? N  Do you have problems with loss of bowel control? N  Managing your Medications? N  Managing your Finances? N  Housekeeping or managing your Housekeeping? N    Patient Care Team: Crecencio Mc, MD as PCP - General (Internal Medicine)    Assessment:   This is a routine wellness examination for Tina Silva. The goal of the wellness visit is to assist the patient how to close the gaps in care and create a preventative care plan for the patient.   Taking VIT D3 Calcium as appropriate/Osteoporosis risk reviewed.  Medications reviewed; taking without issues or barriers.  Safety issues reviewed; smoke detectors in the  home. No firearms in the home. Wears seatbelts when driving or riding with others. No violence in the home.  No identified risk were noted; The patient was oriented x 3; appropriate in dress and manner and no objective failures at ADL's or IADL's.   Patient Concerns:  Difficulty hearing in crowds; declined auditory referral. Education provided.  Follow up with PCP.  Need for CPE; appointment scheduled.   Exercise Activities and Dietary recommendations Current Exercise Habits:: Home exercise routine, Type of exercise: walking, Time (Minutes): 60, Frequency (Times/Week): 3, Weekly Exercise (Minutes/Week): 180, Intensity: Mild  Goals    . Healthy Lifestyle      Maintain walking regiment  Stay hydrated, drink plenty of fluids Low carb foods and lean meats       Fall Risk Fall Risk  03/14/2015 10/12/2013 06/30/2012 12/30/2011 12/30/2011  Falls in the past year? No No No No No   Depression Screen PHQ 2/9 Scores 03/14/2015 10/12/2013 06/30/2012 12/30/2011  PHQ - 2 Score 0 0 0 0     Cognitive Testing MMSE - Mini Mental State Exam 03/14/2015  Orientation to time 5  Orientation to Place 5  Registration 3  Attention/ Calculation 5  Recall 3  Language- name 2 objects 2  Language- repeat 1  Language- follow 3 step command 3  Language- read & follow direction 1  Write a sentence 1  Copy design 1  Total score 30    Immunization History  Administered Date(s) Administered  . Influenza Whole 10/22/2011  . Influenza-Unspecified 11/05/2012, 10/11/2013, 10/06/2014  . Pneumococcal Conjugate-13 10/12/2013  . Pneumococcal Polysaccharide-23 07/15/2012  . Tdap 12/30/2011  . Zoster 07/22/2011   Screening Tests Health Maintenance  Topic Date Due  . INFLUENZA VACCINE  08/29/2015  . TETANUS/TDAP  12/29/2021  . DEXA SCAN  Completed  . ZOSTAVAX  Completed  . PNA vac Low Risk Adult  Completed      Plan:   End of life planning; Advance aging; Advanced directives discussed. Copy of current HCPOA/Living Will requested.   During the course of the visit the patient was educated and counseled about the following appropriate screening and preventive services:   Vaccines to include Pneumoccal, Influenza, Hepatitis B, Td, Zostavax, HCV  Electrocardiogram  Cardiovascular Disease  Colorectal cancer screening  Bone density screening  Diabetes screening  Glaucoma screening  Mammography/PAP  Nutrition counseling   Patient Instructions (the written plan) was given to the patient.   Varney Biles, LPN  [id number]

## 2015-03-14 NOTE — Progress Notes (Signed)
  I have reviewed the above information and agree with above.   Folasade Mooty, MD 

## 2015-03-23 DIAGNOSIS — H2511 Age-related nuclear cataract, right eye: Secondary | ICD-10-CM | POA: Diagnosis not present

## 2015-04-07 ENCOUNTER — Encounter: Payer: Self-pay | Admitting: Internal Medicine

## 2015-04-07 ENCOUNTER — Ambulatory Visit (INDEPENDENT_AMBULATORY_CARE_PROVIDER_SITE_OTHER): Payer: Medicare Other | Admitting: Internal Medicine

## 2015-04-07 VITALS — BP 116/72 | HR 68 | Temp 98.0°F | Resp 12 | Ht 64.0 in | Wt 146.2 lb

## 2015-04-07 DIAGNOSIS — Z0000 Encounter for general adult medical examination without abnormal findings: Secondary | ICD-10-CM | POA: Diagnosis not present

## 2015-04-07 DIAGNOSIS — E785 Hyperlipidemia, unspecified: Secondary | ICD-10-CM | POA: Diagnosis not present

## 2015-04-07 DIAGNOSIS — E038 Other specified hypothyroidism: Secondary | ICD-10-CM

## 2015-04-07 DIAGNOSIS — R739 Hyperglycemia, unspecified: Secondary | ICD-10-CM

## 2015-04-07 DIAGNOSIS — M810 Age-related osteoporosis without current pathological fracture: Secondary | ICD-10-CM

## 2015-04-07 DIAGNOSIS — E559 Vitamin D deficiency, unspecified: Secondary | ICD-10-CM | POA: Diagnosis not present

## 2015-04-07 DIAGNOSIS — I10 Essential (primary) hypertension: Secondary | ICD-10-CM | POA: Diagnosis not present

## 2015-04-07 LAB — COMPREHENSIVE METABOLIC PANEL
ALT: 18 U/L (ref 0–35)
AST: 20 U/L (ref 0–37)
Albumin: 4.3 g/dL (ref 3.5–5.2)
Alkaline Phosphatase: 56 U/L (ref 39–117)
BUN: 22 mg/dL (ref 6–23)
CHLORIDE: 102 meq/L (ref 96–112)
CO2: 31 meq/L (ref 19–32)
Calcium: 9.6 mg/dL (ref 8.4–10.5)
Creatinine, Ser: 1.09 mg/dL (ref 0.40–1.20)
GFR: 51.56 mL/min — ABNORMAL LOW (ref >60.00)
GLUCOSE: 99 mg/dL (ref 70–99)
POTASSIUM: 4.2 meq/L (ref 3.5–5.1)
SODIUM: 140 meq/L (ref 135–145)
Total Bilirubin: 0.8 mg/dL (ref 0.2–1.2)
Total Protein: 7.1 g/dL (ref 6.0–8.3)

## 2015-04-07 LAB — LIPID PANEL
CHOL/HDL RATIO: 4
Cholesterol: 187 mg/dL (ref 0–200)
HDL: 50.4 mg/dL (ref >39.00)
LDL CALC: 114 mg/dL — AB (ref 0–99)
NONHDL: 137.01
Triglycerides: 115 mg/dL (ref 0.0–149.0)
VLDL: 23 mg/dL (ref 0.0–40.0)

## 2015-04-07 LAB — TSH: TSH: 2.29 u[IU]/mL (ref 0.35–4.50)

## 2015-04-07 LAB — VITAMIN D 25 HYDROXY (VIT D DEFICIENCY, FRACTURES): VITD: 32.01 ng/mL (ref 30.00–100.00)

## 2015-04-07 NOTE — Progress Notes (Signed)
Pre-visit discussion using our clinic review tool. No additional management support is needed unless otherwise documented below in the visit note.  

## 2015-04-07 NOTE — Progress Notes (Signed)
Patient ID: Tina Silva, female    DOB: 1934-10-12  Age: 81 y.o. MRN: [id number]  The patient is here for annual Medicare wellness examination and management of other chronic and acute problems.   The risk factors are reflected in the social history.  The roster of all physicians providing medical care to patient - is listed in the Snapshot section of the chart.  Activities of daily living:  The patient is 100% independent in all ADLs: dressing, toileting, feeding as well as independent mobility  Home safety : The patient has smoke detectors in the home. They wear seatbelts.  There are no firearms at home. There is no violence in the home.   There is no risks for hepatitis, STDs or HIV. There is no   history of blood transfusion. They have no travel history to infectious disease endemic areas of the world.  The patient has seen their dentist in the last six month. They have seen their eye doctor in the last year. They admit to slight hearing difficulty with regard to whispered voices and some television programs.  They have deferred audiologic testing in the last year.  They do not  have excessive sun exposure. Discussed the need for sun protection: hats, long sleeves and use of sunscreen if there is significant sun exposure.   Diet: the importance of a healthy diet is discussed. They do have a healthy diet.  The benefits of regular aerobic exercise were discussed. She walks 4 times per week ,  20 minutes.   Depression screen: there are no signs or vegative symptoms of depression- irritability, change in appetite, anhedonia, sadness/tearfullness.  Cognitive assessment: the patient manages all their financial and personal affairs and is actively engaged. They could relate day,date,year and events; recalled 2/3 objects at 3 minutes; performed clock-face test normally.  The following portions of the patient's history were reviewed and updated as appropriate: allergies, current medications,  past family history, past medical history,  past surgical history, past social history  and problem list.  Visual acuity was not assessed per patient preference since she has regular follow up with her ophthalmologist. Hearing and body mass index were assessed and reviewed.   During the course of the visit the patient was educated and counseled about appropriate screening and preventive services including : fall prevention , diabetes screening, nutrition counseling, colorectal cancer screening, and recommended immunizations.    CC: The primary encounter diagnosis was Essential hypertension. Diagnoses of Hyperlipidemia with target LDL less than 100, Medicare annual wellness visit, subsequent, Vitamin D deficiency, Other specified hypothyroidism, Hyperglycemia, and Osteoporosis, post-menopausal were also pertinent to this visit.  History Tina Silva has a past medical history of Hypertension.   She has past surgical history that includes Abdominal hysterectomy; Appendectomy (1974); Eye surgery (june 2013); and Tonsillectomy and adenoidectomy.   Her family history includes Cancer in her maternal grandmother and sister; Diabetes in her paternal aunt and sister; Heart disease in her father; Parkinson's disease in her mother.She reports that she has never smoked. She has never used smokeless tobacco. She reports that she does not drink alcohol or use illicit drugs.  Outpatient Prescriptions Prior to Visit  Medication Sig Dispense Refill  . aspirin 81 MG tablet Take 81 mg by mouth daily.    . calcium-vitamin D (OSCAL WITH D) 500-200 MG-UNIT tablet Take 1 tablet by mouth.     . Cholecalciferol (VITAMIN D3) 1000 units CAPS Take 1 capsule by mouth.    . metoprolol tartrate (LOPRESSOR) 25  MG tablet TAKE ONE TABLET TWICE A DAY 60 tablet 2  . Multiple Vitamin (MULTIVITAMIN) tablet Take 1 tablet by mouth daily.    . risedronate (ACTONEL) 35 MG tablet Take 1 tablet (35 mg total) by mouth every 7 (seven) days. with  water on empty stomach, nothing by mouth or lie down for next 30 minutes. 12 tablet 1  . triamterene-hydrochlorothiazide (MAXZIDE-25) 37.5-25 MG tablet TAKE ONE-HALF TABLET DAILY 30 tablet 2   No facility-administered medications prior to visit.    Review of Systems   Patient denies headache, fevers, malaise, unintentional weight loss, skin rash, eye pain, sinus congestion and sinus pain, sore throat, dysphagia,  hemoptysis , cough, dyspnea, wheezing, chest pain, palpitations, orthopnea, edema, abdominal pain, nausea, melena, diarrhea, constipation, flank pain, dysuria, hematuria, urinary  Frequency, nocturia, numbness, tingling, seizures,  Focal weakness, Loss of consciousness,  Tremor, insomnia, depression, anxiety, and suicidal ideation.      Objective:  BP 116/72 mmHg  Pulse 68  Temp(Src) 98 F (36.7 C) (Oral)  Resp 12  Ht '5\' 4"'  (1.626 m)  Wt 146 lb 4 oz (66.339 kg)  BMI 25.09 kg/m2  SpO2 99%  Physical Exam   General appearance: alert, cooperative and appears stated age Head: Normocephalic, without obvious abnormality, atraumatic Eyes: conjunctivae/corneas clear. PERRL, EOM's intact. Fundi benign. Ears: normal TM's and external ear canals both ears Nose: Nares normal. Septum midline. Mucosa normal. No drainage or sinus tenderness. Throat: lips, mucosa, and tongue normal; teeth and gums normal Neck: no adenopathy, no carotid bruit, no JVD, supple, symmetrical, trachea midline and thyroid not enlarged, symmetric, no tenderness/mass/nodules Lungs: clear to auscultation bilaterally Breasts: normal appearance, no masses or tenderness Heart: regular rate and rhythm, S1, S2 normal, no murmur, click, rub or gallop Abdomen: soft, non-tender; bowel sounds normal; no masses,  no organomegaly Extremities: extremities normal, atraumatic, no cyanosis or edema Pulses: 2+ and symmetric Skin: Skin color, texture, turgor normal. No rashes or lesions Neurologic: Alert and oriented X 3, normal  strength and tone. Normal symmetric reflexes. Normal coordination and gait.     Assessment & Plan:   Problem List Items Addressed This Visit    Hypertension - Primary    Well controlled on current regimen. Renal function stable, no changes today.  Lab Results  Component Value Date   CREATININE 1.09 04/07/2015   Lab Results  Component Value Date   NA 140 04/07/2015   K 4.2 04/07/2015   CL 102 04/07/2015   CO2 31 04/07/2015         Relevant Orders   Comp Met (CMET) (Completed)   Hyperlipidemia with target LDL less than 100    Well controlled on diet alone . Lab Results  Component Value Date   CHOL 187 04/07/2015   HDL 50.40 04/07/2015   LDLCALC 114* 04/07/2015   TRIG 115.0 04/07/2015   CHOLHDL 4 04/07/2015   Lab Results  Component Value Date   ALT 18 04/07/2015   AST 20 04/07/2015   ALKPHOS 56 04/07/2015   BILITOT 0.8 04/07/2015             Relevant Orders   Lipid Profile (Completed)   Osteoporosis, post-menopausal    treated with alendronate  Followed by change to risedronate.  History of  remote wrist fracture.         Hyperglycemia    She has no signs of DM by follow up labs and prior a1c was < 6.0. Fasting sugars remain < 100  Medicare annual wellness visit, subsequent    Annual comprehensive preventive exam was done as well as an evaluation and management of chronic conditions .  During the course of the visit the patient was educated and counseled about appropriate screening and preventive services including :  diabetes screening, lipid analysis with projected  10 year  risk for CAD , nutrition counseling, breast, cervical and colorectal cancer screening, and recommended immunizations.  Printed recommendations for health maintenance screenings was give       Other Visit Diagnoses    Vitamin D deficiency        Relevant Orders    Vitamin D (25 hydroxy) (Completed)    Other specified hypothyroidism        Relevant Orders    TSH  (Completed)       I am having Ms. Parrack maintain her multivitamin, aspirin, risedronate, triamterene-hydrochlorothiazide, metoprolol tartrate, calcium-vitamin D, and Vitamin D3.  No orders of the defined types were placed in this encounter.    There are no discontinued medications.  Follow-up: No Follow-up on file.   Crecencio Mc, MD

## 2015-04-09 ENCOUNTER — Encounter: Payer: Self-pay | Admitting: Internal Medicine

## 2015-04-09 NOTE — Assessment & Plan Note (Signed)
Annual comprehensive preventive exam was done as well as an evaluation and management of chronic conditions .  During the course of the visit the patient was educated and counseled about appropriate screening and preventive services including :  diabetes screening, lipid analysis with projected  10 year  risk for CAD , nutrition counseling, breast, cervical and colorectal cancer screening, and recommended immunizations.  Printed recommendations for health maintenance screenings was give 

## 2015-04-09 NOTE — Assessment & Plan Note (Signed)
treated with alendronate  Followed by change to risedronate.  History of  remote wrist fracture.    

## 2015-04-09 NOTE — Patient Instructions (Signed)

## 2015-04-09 NOTE — Assessment & Plan Note (Signed)
Well controlled on current regimen. Renal function stable, no changes today.  Lab Results  Component Value Date   CREATININE 1.09 04/07/2015   Lab Results  Component Value Date   NA 140 04/07/2015   K 4.2 04/07/2015   CL 102 04/07/2015   CO2 31 04/07/2015

## 2015-04-09 NOTE — Assessment & Plan Note (Signed)
Well controlled on diet alone . Lab Results  Component Value Date   CHOL 187 04/07/2015   HDL 50.40 04/07/2015   LDLCALC 114* 04/07/2015   TRIG 115.0 04/07/2015   CHOLHDL 4 04/07/2015   Lab Results  Component Value Date   ALT 18 04/07/2015   AST 20 04/07/2015   ALKPHOS 56 04/07/2015   BILITOT 0.8 04/07/2015

## 2015-04-09 NOTE — Assessment & Plan Note (Signed)
She has no signs of DM by follow up labs and prior a1c was < 6.0. Fasting sugars remain < 100

## 2015-04-20 DIAGNOSIS — J208 Acute bronchitis due to other specified organisms: Secondary | ICD-10-CM | POA: Diagnosis not present

## 2015-04-20 DIAGNOSIS — J069 Acute upper respiratory infection, unspecified: Secondary | ICD-10-CM | POA: Diagnosis not present

## 2015-05-02 ENCOUNTER — Telehealth: Payer: Self-pay | Admitting: Internal Medicine

## 2015-05-02 DIAGNOSIS — Z1239 Encounter for other screening for malignant neoplasm of breast: Secondary | ICD-10-CM

## 2015-05-02 NOTE — Telephone Encounter (Signed)
Pt saw Dr. Derrel Nip on 04/07/15 for a physical. Pt said that Dr. Derrel Nip want her to get a  mammogram . She has been waiting for a phone call from our office on when to go. I do not see any orders for a mammogram. Please call pt on cell phone.

## 2015-05-02 NOTE — Telephone Encounter (Signed)
Done.  You can do this in the future,  Since Juliann Pulse does it

## 2015-05-02 NOTE — Telephone Encounter (Signed)
Need new order for mammogram as last order was from 2015. thanks

## 2015-05-02 NOTE — Telephone Encounter (Signed)
Can you please assist with scheduling her? Thanks

## 2015-05-03 ENCOUNTER — Ambulatory Visit (INDEPENDENT_AMBULATORY_CARE_PROVIDER_SITE_OTHER): Payer: Medicare Other | Admitting: Internal Medicine

## 2015-05-03 ENCOUNTER — Encounter: Payer: Self-pay | Admitting: Internal Medicine

## 2015-05-03 ENCOUNTER — Ambulatory Visit
Admission: RE | Admit: 2015-05-03 | Discharge: 2015-05-03 | Disposition: A | Discharge status: Home / Self Care | Payer: Medicare Other | Source: Ambulatory Visit | Attending: Internal Medicine | Admitting: Internal Medicine

## 2015-05-03 ENCOUNTER — Telehealth: Payer: Self-pay | Admitting: *Deleted

## 2015-05-03 VITALS — BP 125/67 | HR 73 | Temp 98.9°F | Ht 64.0 in | Wt 148.0 lb

## 2015-05-03 DIAGNOSIS — R0989 Other specified symptoms and signs involving the circulatory and respiratory systems: Secondary | ICD-10-CM | POA: Diagnosis not present

## 2015-05-03 DIAGNOSIS — R0789 Other chest pain: Secondary | ICD-10-CM | POA: Diagnosis not present

## 2015-05-03 DIAGNOSIS — Z9889 Other specified postprocedural states: Secondary | ICD-10-CM | POA: Insufficient documentation

## 2015-05-03 DIAGNOSIS — R079 Chest pain, unspecified: Secondary | ICD-10-CM | POA: Diagnosis not present

## 2015-05-03 DIAGNOSIS — R05 Cough: Secondary | ICD-10-CM | POA: Insufficient documentation

## 2015-05-03 DIAGNOSIS — R059 Cough, unspecified: Secondary | ICD-10-CM

## 2015-05-03 NOTE — Assessment & Plan Note (Addendum)
Recent episodes of chest pain on exertion. Described as "heaviness." Pt reports previous cardiac evaluation including stress test and cath were normal, however in distant past. EKG today shows no acute changes. Risks for CAD including HTN and family history. Will set up cardiology evaluation for possible stress test.

## 2015-05-03 NOTE — Telephone Encounter (Signed)
Patient was seen in the office today, and was advised to return in a week, please advise a place for patient for a 30min follow up.

## 2015-05-03 NOTE — Patient Instructions (Signed)
Please go to [facility] for a chest xray today.  We will set up an evaluation with cardiology for chest heaviness.  Follow up here in 1 week and sooner as needed.

## 2015-05-03 NOTE — Telephone Encounter (Signed)
You can put her on Dr. Caryl Bis schedule for next Thursday. I tried to call the patient to schedule appointment but no answer.

## 2015-05-03 NOTE — Progress Notes (Signed)
Pre visit review using our clinic review tool, if applicable. No additional management support is needed unless otherwise documented below in the visit note. 

## 2015-05-03 NOTE — Progress Notes (Signed)
Subjective:    Patient ID: Tina Silva, female    DOB: [birth year], 81 y.o.   MRN: [id number]  HPI  81YO female presents for acute visit.  3/18 developed fever, chills. Went to [facility]. Treated with Doxycyline for 10 days.  Completed Sunday. Continues to have some cough which is productive of clear mucous. Sometimes feels tightness in chest. 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 No palpitations. No diaphoresis. No dyspnea.  Wt Readings from Last 3 Encounters:  05/03/15 148 lb (67.132 kg)  04/07/15 146 lb 4 oz (66.339 kg)  03/14/15 146 lb 6.4 oz (66.407 kg)   BP Readings from Last 3 Encounters:  05/03/15 125/67  04/07/15 116/72  03/14/15 118/66    Past Medical History  Diagnosis Date  . Hypertension    Family History  Problem Relation Age of Onset  . Parkinson's disease Mother   . Cancer Sister     AML  . Diabetes Sister   . Heart disease Father   . Diabetes Paternal Aunt   . Cancer Maternal Grandmother    Past Surgical History  Procedure Laterality Date  . Abdominal hysterectomy    . Appendectomy  1974  . Eye surgery  june 2013    cataract with lens,  dinglein left eye   . Tonsillectomy and adenoidectomy     Social History   Social History  . Marital Status: Single    Spouse Name: N/A  . Number of Children: N/A  . Years of Education: N/A   Social History Main Topics  . Smoking status: Never Smoker   . Smokeless tobacco: Never Used  . Alcohol Use: No  . Drug Use: No  . Sexual Activity: No   Other Topics Concern  . None   Social History Narrative    Review of Systems  Constitutional: Negative for fever, chills, appetite change, fatigue and unexpected weight change.  HENT: Negative for congestion, postnasal drip, rhinorrhea, sinus pressure, sneezing, sore throat and trouble swallowing.   Eyes: Negative for visual disturbance.    Respiratory: Positive for cough. Negative for chest tightness, shortness of breath and wheezing.   Cardiovascular: Positive for chest pain. Negative for palpitations and leg swelling.  Gastrointestinal: Negative for nausea, vomiting, abdominal pain, diarrhea and constipation.  Musculoskeletal: Negative for myalgias and arthralgias.  Skin: Negative for color change and rash.  Hematological: Negative for adenopathy. Does not bruise/bleed easily.  Psychiatric/Behavioral: Negative for sleep disturbance and dysphoric mood. The patient is not nervous/anxious.        Objective:    BP 125/67 mmHg  Pulse 73  Temp(Src) 98.9 F (37.2 C) (Oral)  Ht 5\' 4"  (1.626 m)  Wt 148 lb (67.132 kg)  BMI 25.39 kg/m2  SpO2 97% Physical Exam  Constitutional: She is oriented to person, place, and time. She appears well-developed and well-nourished. No distress.  HENT:  Head: Normocephalic and atraumatic.  Right Ear: External ear normal.  Left Ear: External ear normal.  Nose: Nose normal.  Mouth/Throat: Oropharynx is clear and moist. No oropharyngeal exudate.  Eyes: Conjunctivae are normal. Pupils are equal, round, and reactive to light. Right eye exhibits no discharge. Left eye exhibits no discharge. No scleral icterus.  Neck: Normal range of motion. Neck supple. No tracheal deviation present. No thyromegaly present.  Cardiovascular: Normal rate, regular rhythm, normal heart sounds and intact distal pulses.  Exam reveals  no gallop and no friction rub.   No murmur heard. Pulmonary/Chest: Effort normal and breath sounds normal. No accessory muscle usage. No respiratory distress. She has no decreased breath sounds. She has no wheezes. She has no rhonchi. She has no rales. She exhibits no tenderness.  Musculoskeletal: Normal range of motion. She exhibits no edema or tenderness.  Lymphadenopathy:    She has no cervical adenopathy.  Neurological: She is alert and oriented to person, place, and time. No cranial  nerve deficit. She exhibits normal muscle tone. Coordination normal.  Skin: Skin is warm and dry. No rash noted. She is not diaphoretic. No erythema. No pallor.  Psychiatric: She has a normal mood and affect. Her behavior is normal. Judgment and thought content normal.          Assessment & Plan:   Problem List Items Addressed This Visit      Unprioritized   Chest heaviness   Relevant Orders   EKG 12-Lead (Completed)   Ambulatory referral to Cardiology   Chest pain - Primary    Recent episodes of chest pain on exertion. Described as "heaviness." Pt reports previous cardiac evaluation including stress test and cath were normal, however in distant past. EKG today shows no acute changes. Risks for CAD including HTN and family history. Will set up cardiology evaluation for possible stress test.      Cough    Recent cough, congestion, likely viral URI. Pulmonary exam normal today. Given chest pain and some persistent symptoms, we will also get CXR today.      Relevant Orders   DG Chest 2 View       Return in about 1 week (around 05/10/2015) for Recheck.  Ronette Deter, MD Internal Medicine [facility]

## 2015-05-03 NOTE — Assessment & Plan Note (Signed)
Recent cough, congestion, likely viral URI. Pulmonary exam normal today. Given chest pain and some persistent symptoms, we will also get CXR today.

## 2015-05-19 ENCOUNTER — Other Ambulatory Visit: Payer: Self-pay | Admitting: Internal Medicine

## 2015-05-19 ENCOUNTER — Ambulatory Visit
Admission: RE | Admit: 2015-05-19 | Discharge: 2015-05-19 | Disposition: A | Discharge status: Home / Self Care | Payer: Medicare Other | Source: Ambulatory Visit | Attending: Internal Medicine | Admitting: Internal Medicine

## 2015-05-19 DIAGNOSIS — Z1231 Encounter for screening mammogram for malignant neoplasm of breast: Secondary | ICD-10-CM | POA: Diagnosis not present

## 2015-05-19 DIAGNOSIS — Z1239 Encounter for other screening for malignant neoplasm of breast: Secondary | ICD-10-CM

## 2015-05-23 ENCOUNTER — Encounter: Payer: Self-pay | Admitting: Cardiology

## 2015-05-23 ENCOUNTER — Ambulatory Visit: Payer: Medicare Other | Admitting: Family Medicine

## 2015-05-23 ENCOUNTER — Ambulatory Visit (INDEPENDENT_AMBULATORY_CARE_PROVIDER_SITE_OTHER): Payer: Medicare Other | Admitting: Cardiology

## 2015-05-23 VITALS — BP 124/70 | HR 68 | Ht 64.0 in | Wt 147.8 lb

## 2015-05-23 DIAGNOSIS — R079 Chest pain, unspecified: Secondary | ICD-10-CM | POA: Diagnosis not present

## 2015-05-23 DIAGNOSIS — I10 Essential (primary) hypertension: Secondary | ICD-10-CM | POA: Diagnosis not present

## 2015-05-23 NOTE — Patient Instructions (Addendum)
Medication Instructions:  Your physician recommends that you continue on your current medications as directed. Please refer to the Current Medication list given to you today.   Labwork: None ordered  Testing/Procedures: Your physician has requested that you have an echocardiogram. Echocardiography is a painless test that uses sound waves to create images of your heart. It provides your doctor with information about the size and shape of your heart and how well your heart's chambers and valves are working. This procedure takes approximately one hour. There are no restrictions for this procedure.  Date & Time: ___________________________________________________________  [facility]  Your caregiver has ordered a Stress Test with nuclear imaging. The purpose of this test is to evaluate the blood supply to your heart muscle. This procedure is referred to as a "Non-Invasive Stress Test." This is because other than having an IV started in your vein, nothing is inserted or "invades" your body. Cardiac stress tests are done to find areas of poor blood flow to the heart by determining the extent of coronary artery disease (CAD). Some patients exercise on a treadmill, which naturally increases the blood flow to your heart, while others who are  unable to walk on a treadmill due to physical limitations have a pharmacologic/chemical stress agent called Lexiscan . This medicine will mimic walking on a treadmill by temporarily increasing your coronary blood flow.   Please note: these test may take anywhere between 2-4 hours to complete  PLEASE REPORT TO [facility] AT THE FIRST DESK WILL DIRECT YOU WHERE TO GO  Date of Procedure:__Thursday Jun 01, 2015 at 07:30Am__________  Arrival Time for Procedure:_Arrive at 07:15AM_________  Instructions regarding medication:   _X___ : Hold diabetes medication morning of procedure  _X___:  Hold Metoprolol the night before procedure  and morning of procedure    PLEASE NOTIFY THE OFFICE AT LEAST 24 HOURS IN ADVANCE IF YOU ARE UNABLE TO KEEP YOUR APPOINTMENT.  [phone] AND  PLEASE NOTIFY NUCLEAR MEDICINE AT [facility] AT LEAST 24 HOURS IN ADVANCE IF YOU ARE UNABLE TO KEEP YOUR APPOINTMENT. [phone]  How to prepare for your Myoview test:   Do not eat or drink after midnight  No caffeine for 24 hours prior to test  No smoking 24 hours prior to test.  Your medication may be taken with water.  If your doctor stopped a medication because of this test, do not take that medication.  Ladies, please do not wear dresses.  Skirts or pants are appropriate. Please wear a short sleeve shirt.  No perfume, cologne or lotion.  Wear comfortable walking shoes. No heels!       Follow-Up: Your physician recommends that you schedule a follow-up appointment after testing has been completed to review results.  Date & Time: ______________________________________________________________   Any Other Special Instructions Will Be Listed Below (If Applicable).     If you need a refill on your cardiac medications before your next appointment, please call your pharmacy.  Echocardiogram An echocardiogram, or echocardiography, uses sound waves (ultrasound) to produce an image of your heart. The echocardiogram is simple, painless, obtained within a short period of time, and offers valuable information to your health care provider. The images from an echocardiogram can provide information such as:  Evidence of coronary artery disease (CAD).  Heart size.  Heart muscle function.  Heart valve function.  Aneurysm detection.  Evidence of a past heart attack.  Fluid buildup around the heart.  Heart muscle thickening.  Assess heart  valve function. LET [facility] CARE PROVIDER KNOW ABOUT:  Any allergies you have.  All medicines you are taking, including vitamins, herbs, eye drops, creams, and over-the-counter  medicines.  Previous problems you or members of your family have had with the use of anesthetics.  Any blood disorders you have.  Previous surgeries you have had.  Medical conditions you have.  Possibility of pregnancy, if this applies. BEFORE THE PROCEDURE  No special preparation is needed. Eat and drink normally.  PROCEDURE   In order to produce an image of your heart, gel will be applied to your chest and a wand-like tool (transducer) will be moved over your chest. The gel will help transmit the sound waves from the transducer. The sound waves will harmlessly bounce off your heart to allow the heart images to be captured in real-time motion. These images will then be recorded.  You may need an IV to receive a medicine that improves the quality of the pictures. AFTER THE PROCEDURE You may return to your normal schedule including diet, activities, and medicines, unless your health care provider tells you otherwise.   This information is not intended to replace advice given to you by your health care provider. Make sure you discuss any questions you have with your health care provider.   Document Released: 01/12/2000 Document Revised: 02/04/2014 Document Reviewed: 09/21/2012 Elsevier Interactive Patient Education 2016 [place].   Cardiac Nuclear Scanning A cardiac nuclear scan is used to check your heart for problems, such as the following:  A portion of the heart is not getting enough blood.  Part of the heart muscle has died, which happens with a heart attack.  The heart wall is not working normally.  In this test, a radioactive dye (tracer) is injected into your bloodstream. After the tracer has traveled to your heart, a scanning device is used to measure how much of the tracer is absorbed by or distributed to various areas of your heart. LET [facility] CARE PROVIDER KNOW ABOUT:  Any allergies you have.  All medicines you are taking, including vitamins, herbs, eye  drops, creams, and over-the-counter medicines.  Previous problems you or members of your family have had with the use of anesthetics.  Any blood disorders you have.  Previous surgeries you have had.  Medical conditions you have.  RISKS AND COMPLICATIONS Generally, this is a safe procedure. However, as with any procedure, problems can occur. Possible problems include:   Serious chest pain.  Rapid heartbeat.  Sensation of warmth in your chest. This usually passes quickly. BEFORE THE PROCEDURE Ask your health care provider about changing or stopping your regular medicines. PROCEDURE This procedure is usually done at a hospital and takes 2-4 hours.  An IV tube is inserted into one of your veins.  Your health care provider will inject a small amount of radioactive tracer through the tube.  You will then wait for 20-40 minutes while the tracer travels through your bloodstream.  You will lie down on an exam table so images of your heart can be taken. Images will be taken for about 15-20 minutes.  You will exercise on a treadmill or stationary bike. While you exercise, your heart activity will be monitored with an electrocardiogram (ECG), and your blood pressure will be checked.  If you are unable to exercise, you may be given a medicine to make your heart beat faster.  When blood flow to your heart has peaked, tracer will again be injected through the IV  tube.  After 20-40 minutes, you will get back on the exam table and have more images taken of your heart.  When the procedure is over, your IV tube will be removed. AFTER THE PROCEDURE  You will likely be able to leave shortly after the test. Unless your health care provider tells you otherwise, you may return to your normal schedule, including diet, activities, and medicines.  Make sure you find out how and when you will get your test results.   This information is not intended to replace advice given to you by your health  care provider. Make sure you discuss any questions you have with your health care provider.   Document Released: 02/09/2004 Document Revised: 01/19/2013 Document Reviewed: 12/23/2012 Elsevier Interactive Patient Education [employer].

## 2015-05-23 NOTE — Progress Notes (Signed)
Cardiology Office Note   Date:  05/23/2015   ID:  Tina Silva, DOB [birth year], MRN [id number]  Referring Doctor:  Tina Mc, MD   Cardiologist:   Tina Bushy, MD   Reason for consultation:  Chief Complaint  Patient presents with  . New Patient (Initial Visit)    Referred by Primary doctor. Chast pain when pushing heavy objects.      History of Present Illness: Tina Silva is a 81 y.o. female who presents for Chest pain. She says that this started probably beginning of April. She was recovering from a flulike illness with fever and mild cough. She describes the chest pain and the heaviness or pressure in the chest. Mild in intensity, 2 out of 10. Nonradiating. Occurs with exertion, pushing a self-propelled mower, and pushing a heavy person. She volunteers at the hospital.The pain lasts a few seconds to minutes, resolves immediately upon cessation of physical activity. She denies associated shortness of breath.  Otherwise, no PND, orthopnea, edema. No palpitations. No loss of consciousness. She still currently dealing with some sinus congestion which she thinks is related to her lack of taste.   ROS:  Please see the history of present illness. Aside from mentioned under HPI, all other systems are reviewed and negative.     Past Medical History  Diagnosis Date  . Hypertension     Past Surgical History  Procedure Laterality Date  . Abdominal hysterectomy    . Appendectomy  1974  . Eye surgery  june 2013    cataract with lens,  dinglein left eye   . Tonsillectomy and adenoidectomy    . Breast biopsy Left 11/04/2005     reports that she has never smoked. She has never used smokeless tobacco. She reports that she does not drink alcohol or use illicit drugs.   family history includes Cancer in her maternal grandmother and sister; Diabetes in her paternal aunt and sister; Heart disease in her father; Parkinson's disease in her mother.   Current Outpatient  Prescriptions  Medication Sig Dispense Refill  . aspirin 81 MG tablet Take 81 mg by mouth daily.    . calcium-vitamin D (OSCAL WITH D) 500-200 MG-UNIT tablet Take 1 tablet by mouth.     . Cholecalciferol (VITAMIN D3) 1000 units CAPS Take 1 capsule by mouth.    . metoprolol tartrate (LOPRESSOR) 25 MG tablet TAKE ONE TABLET TWICE A DAY 60 tablet 2  . Multiple Vitamin (MULTIVITAMIN) tablet Take 1 tablet by mouth daily.    . risedronate (ACTONEL) 35 MG tablet Take 1 tablet (35 mg total) by mouth every 7 (seven) days. with water on empty stomach, nothing by mouth or lie down for next 30 minutes. 12 tablet 1  . triamterene-hydrochlorothiazide (MAXZIDE-25) 37.5-25 MG tablet TAKE ONE-HALF TABLET DAILY 30 tablet 2   No current facility-administered medications for this visit.    Allergies: Ciprofloxacin; Flagyl; Iodine; and Sudafed    PHYSICAL EXAM: VS:  BP 124/70 mmHg  Pulse 68  Ht 5\' 4"  (1.626 m)  Wt 147 lb 12.8 oz (67.042 kg)  BMI 25.36 kg/m2 , Body mass index is 25.36 kg/(m^2). Wt Readings from Last 3 Encounters:  05/23/15 147 lb 12.8 oz (67.042 kg)  05/03/15 148 lb (67.132 kg)  04/07/15 146 lb 4 oz (66.339 kg)    GENERAL:  well developed, well nourished, not in acute distress HEENT: normocephalic, pink conjunctivae, anicteric sclerae, no xanthelasma, normal dentition, oropharynx clear NECK:  no neck vein engorgement,  JVP normal, no hepatojugular reflux, carotid upstroke brisk and symmetric, no bruit, no thyromegaly, no lymphadenopathy LUNGS:  good respiratory effort, clear to auscultation bilaterally CV:  PMI not displaced, no thrills, no lifts, S1 and S2 within normal limits, no palpable S3 or S4, no murmurs, no rubs, no gallops ABD:  Soft, nontender, nondistended, normoactive bowel sounds, no abdominal aortic bruit, no hepatomegaly, no splenomegaly MS: nontender back, no kyphosis, no scoliosis, no joint deformities EXT:  2+ DP/PT pulses, no edema, no varicosities, no cyanosis, no  clubbing SKIN: warm, nondiaphoretic, normal turgor, no ulcers NEUROPSYCH: alert, oriented to person, place, and time, sensory/motor grossly intact, normal mood, appropriate affect  Recent Labs: 04/07/2015: ALT 18; BUN 22; Creatinine, Ser 1.09; Potassium 4.2; Sodium 140; TSH 2.29   Lipid Panel    Component Value Date/Time   CHOL 187 04/07/2015 0919   TRIG 115.0 04/07/2015 0919   HDL 50.40 04/07/2015 0919   CHOLHDL 4 04/07/2015 0919   VLDL 23.0 04/07/2015 0919   LDLCALC 114* 04/07/2015 0919     Other studies Reviewed:  EKG:  The ekg from 05/03/2015  was personally reviewed by me and it revealed sinus rhythm 73 BPM  Additional studies/ records that were reviewed personally reviewed by me today include: None available   ASSESSMENT AND PLAN: Chest pain, exertional Risk factors for CAD include advanced age, hypertension Recommend echocardiogram and exercise nuclear stress test with modified Bruce protocol. Recommend to call 911 for unrelenting chest pain. Previous episodes were very brief in duration. Patient advised to avoid strenuous activity while getting her scheduled for the tests.  Hypertension BP is well controlled. Continue monitoring BP. Continue current medical therapy and lifestyle changes.   Current medicines are reviewed at length with the patient today.  The patient does not have concerns regarding medicines.  Labs/ tests ordered today include:  Orders Placed This Encounter  Procedures  . NM Myocar Multi W/Spect W/Wall Motion / EF  . ECHOCARDIOGRAM COMPLETE    I had a lengthy and detailed discussion with the patient regarding diagnoses, prognosis, diagnostic options, treatment options .   I counseled the patient on importance of lifestyle modification including heart healthy diet, regular physical activity.   Disposition:   FU with undersigned after tests   Signed, Tina Bushy, MD  05/23/2015 2:06 PM    [place]

## 2015-05-24 ENCOUNTER — Encounter: Payer: Self-pay | Admitting: Family Medicine

## 2015-05-24 ENCOUNTER — Ambulatory Visit (INDEPENDENT_AMBULATORY_CARE_PROVIDER_SITE_OTHER): Payer: Medicare Other | Admitting: Family Medicine

## 2015-05-24 ENCOUNTER — Other Ambulatory Visit: Payer: Self-pay | Admitting: Family Medicine

## 2015-05-24 VITALS — BP 114/72 | HR 65 | Temp 98.1°F | Ht 64.0 in | Wt 147.1 lb

## 2015-05-24 DIAGNOSIS — J069 Acute upper respiratory infection, unspecified: Secondary | ICD-10-CM | POA: Insufficient documentation

## 2015-05-24 MED ORDER — FLUTICASONE PROPIONATE 50 MCG/ACT NA SUSP: 2.0000 | Freq: Every day | NASAL | Status: DC

## 2015-05-24 NOTE — Patient Instructions (Signed)
This is viral.  No evidence of a bacterial infection at this time.  Use the flonase to help with your symptoms.  Also use an over the counter antihistamine, Zyrtec/Allegra/Claritin.  Use Debrox to clean out the wax from your ears.  Follow up as needed.  Take care  Dr. Lacinda Axon  Upper Respiratory Infection, Adult Most upper respiratory infections (URIs) are a viral infection of the air passages leading to the lungs. A URI affects the nose, throat, and upper air passages. The most common type of URI is nasopharyngitis and is typically referred to as "the common cold." URIs run their course and usually go away on their own. Most of the time, a URI does not require medical attention, but sometimes a bacterial infection in the upper airways can follow a viral infection. This is called a secondary infection. Sinus and middle ear infections are common types of secondary upper respiratory infections. Bacterial pneumonia can also complicate a URI. A URI can worsen asthma and chronic obstructive pulmonary disease (COPD). Sometimes, these complications can require emergency medical care and may be life threatening.  CAUSES Almost all URIs are caused by viruses. A virus is a type of germ and can spread from one person to another.  RISKS FACTORS You may be at risk for a URI if:   You smoke.   You have chronic heart or lung disease.  You have a weakened defense (immune) system.   You are very young or very old.   You have nasal allergies or asthma.  You work in crowded or poorly ventilated areas.  You work in health care facilities or schools. SIGNS AND SYMPTOMS  Symptoms typically develop 2-3 days after you come in contact with a cold virus. Most viral URIs last 7-10 days. However, viral URIs from the influenza virus (flu virus) can last 14-18 days and are typically more severe. Symptoms may include:   Runny or stuffy (congested) nose.   Sneezing.   Cough.   Sore throat.    Headache.   Fatigue.   Fever.   Loss of appetite.   Pain in your forehead, behind your eyes, and over your cheekbones (sinus pain).  Muscle aches.  DIAGNOSIS  Your health care provider may diagnose a URI by:  Physical exam.  Tests to check that your symptoms are not due to another condition such as:  Strep throat.  Sinusitis.  Pneumonia.  Asthma. TREATMENT  A URI goes away on its own with time. It cannot be cured with medicines, but medicines may be prescribed or recommended to relieve symptoms. Medicines may help:  Reduce your fever.  Reduce your cough.  Relieve nasal congestion. HOME CARE INSTRUCTIONS   Take medicines only as directed by your health care provider.   Gargle warm saltwater or take cough drops to comfort your throat as directed by your health care provider.  Use a warm mist humidifier or inhale steam from a shower to increase air moisture. This may make it easier to breathe.  Drink enough fluid to keep your urine clear or pale yellow.   Eat soups and other clear broths and maintain good nutrition.   Rest as needed.   Return to work when your temperature has returned to normal or as your health care provider advises. You may need to stay home longer to avoid infecting others. You can also use a face mask and careful hand washing to prevent spread of the virus.  Increase the usage of your inhaler if you have  asthma.   Do not use any tobacco products, including cigarettes, chewing tobacco, or electronic cigarettes. If you need help quitting, ask your health care provider. PREVENTION  The best way to protect yourself from getting a cold is to practice good hygiene.   Avoid oral or hand contact with people with cold symptoms.   Wash your hands often if contact occurs.  There is no clear evidence that vitamin C, vitamin E, echinacea, or exercise reduces the chance of developing a cold. However, it is always recommended to get plenty  of rest, exercise, and practice good nutrition.  SEEK MEDICAL CARE IF:   You are getting worse rather than better.   Your symptoms are not controlled by medicine.   You have chills.  You have worsening shortness of breath.  You have brown or red mucus.  You have yellow or brown nasal discharge.  You have pain in your face, especially when you bend forward.  You have a fever.  You have swollen neck glands.  You have pain while swallowing.  You have white areas in the back of your throat. SEEK IMMEDIATE MEDICAL CARE IF:   You have severe or persistent:  Headache.  Ear pain.  Sinus pain.  Chest pain.  You have chronic lung disease and any of the following:  Wheezing.  Prolonged cough.  Coughing up blood.  A change in your usual mucus.  You have a stiff neck.  You have changes in your:  Vision.  Hearing.  Thinking.  Mood. MAKE SURE YOU:   Understand these instructions.  Will watch your condition.  Will get help right away if you are not doing well or get worse.   This information is not intended to replace advice given to you by your health care provider. Make sure you discuss any questions you have with your health care provider.   Document Released: 07/10/2000 Document Revised: 05/31/2014 Document Reviewed: 04/21/2013 Elsevier Interactive Patient Education [employer].

## 2015-05-24 NOTE — Assessment & Plan Note (Signed)
New problem (to me). Likely viral in nature. No evidence of bacterial infection. No indication for antibiotics. Treating with OTC antihistamine, Flonase.

## 2015-05-24 NOTE — Progress Notes (Signed)
   Subjective:  Patient ID: Tina Silva, female    DOB: 1935-01-21  Age: 81 y.o. MRN: [id number]  CC: Head cold  HPI:  81 year old female presents for an acute visit with the above complaints.  Cold  Patient was seen at the walk-in clinic on 3/23. Per the note it states that she had bronchitis and URI. She was treated with doxycycline.  She has some improvement in her symptoms but no resolution.  She presents with complaints of postnasal drip, decreased taste, decreased hearing.  No sinus pressure or nasal discharge.  No known exacerbating or relieving factors.  No associated fevers or chills.  No shortness of breath.  Social Hx   Social History   Social History  . Marital Status: Single    Spouse Name: N/A  . Number of Children: N/A  . Years of Education: N/A   Social History Main Topics  . Smoking status: Never Smoker   . Smokeless tobacco: Never Used  . Alcohol Use: No  . Drug Use: No  . Sexual Activity: No   Other Topics Concern  . None   Social History Narrative   Review of Systems  HENT: Positive for congestion and voice change.        Decreased taste, hearing difficulty.  Respiratory: Positive for cough.    Objective:  BP 114/72 mmHg  Pulse 65  Temp(Src) 98.1 F (36.7 C) (Oral)  Ht 5\' 4"  (1.626 m)  Wt 147 lb 2 oz (66.735 kg)  BMI 25.24 kg/m2  SpO2 96%  BP/Weight 05/24/2015 [id number]  Systolic BP [id number]  Diastolic BP 72 70 67  Wt. (Lbs) 147.13 147.8 148  BMI 25.24 25.36 25.39   Physical Exam  Constitutional:  Well appearing. NAD.  HENT:  Head: Normocephalic and atraumatic.  Mouth/Throat: Oropharynx is clear and moist.  TM's with cerumen. Visible portion of TM's normal.  Eyes: Conjunctivae are normal.  Neck: Neck supple.  Cardiovascular: Normal rate and regular rhythm.   Pulmonary/Chest: Effort normal and breath sounds normal. She has no wheezes. She has no rales.  Lymphadenopathy:    She has no cervical adenopathy.    Neurological: She is alert.  Psychiatric: She has a normal mood and affect.  Vitals reviewed.  Lab Results  Component Value Date   WBC 5.5 07/06/2013   HGB 14.1 07/06/2013   HCT 41.5 07/06/2013   PLT 157.0 07/06/2013   GLUCOSE 99 04/07/2015   CHOL 187 04/07/2015   TRIG 115.0 04/07/2015   HDL 50.40 04/07/2015   LDLCALC 114* 04/07/2015   ALT 18 04/07/2015   AST 20 04/07/2015   NA 140 04/07/2015   K 4.2 04/07/2015   CL 102 04/07/2015   CREATININE 1.09 04/07/2015   BUN 22 04/07/2015   CO2 31 04/07/2015   TSH 2.29 04/07/2015   HGBA1C 5.6 01/10/2012   Assessment & Plan:   Problem List Items Addressed This Visit    URI (upper respiratory infection) - Primary    New problem (to me). Likely viral in nature. No evidence of bacterial infection. No indication for antibiotics. Treating with OTC antihistamine, Flonase.         Meds ordered this encounter  Medications  . fluticasone (FLONASE) 50 MCG/ACT nasal spray    Sig: Place 2 sprays into both nostrils daily.    Dispense:  16 g    Refill:  6    Follow-up: PRN  [place]

## 2015-05-24 NOTE — Progress Notes (Signed)
Pre visit review using our clinic review tool, if applicable. No additional management support is needed unless otherwise documented below in the visit note. 

## 2015-06-01 ENCOUNTER — Ambulatory Visit
Admission: RE | Admit: 2015-06-01 | Discharge: 2015-06-01 | Disposition: A | Discharge status: Home / Self Care | Payer: Medicare Other | Source: Ambulatory Visit | Attending: Cardiology | Admitting: Cardiology

## 2015-06-01 DIAGNOSIS — R079 Chest pain, unspecified: Secondary | ICD-10-CM

## 2015-06-01 LAB — NM MYOCAR MULTI W/SPECT W/WALL MOTION / EF
CHL CUP NUCLEAR SSS: 0
CSEPED: 4 min
CSEPEDS: 30 s
CSEPEW: 6.4 METS
CSEPPHR: 139 {beats}/min
LV dias vol: 29 mL (ref 46–106)
LVSYSVOL: 6 mL
NUC STRESS TID: 1.31
Percent HR: 97 %
SDS: 0
SRS: 0

## 2015-06-01 MED ORDER — TECHNETIUM TC 99M SESTAMIBI - CARDIOLITE
31.3760 | Freq: Once | INTRAVENOUS | Status: AC | PRN
  Administered 2015-06-01: 09:00:00 31.376 via INTRAVENOUS

## 2015-06-01 MED ORDER — TECHNETIUM TC 99M SESTAMIBI - CARDIOLITE
13.2730 | Freq: Once | INTRAVENOUS | Status: AC | PRN
  Administered 2015-06-01: 08:00:00 13.273 via INTRAVENOUS

## 2015-06-02 ENCOUNTER — Other Ambulatory Visit: Payer: Self-pay | Admitting: Internal Medicine

## 2015-06-08 ENCOUNTER — Other Ambulatory Visit: Payer: Self-pay

## 2015-06-08 ENCOUNTER — Ambulatory Visit (INDEPENDENT_AMBULATORY_CARE_PROVIDER_SITE_OTHER): Payer: Medicare Other

## 2015-06-08 DIAGNOSIS — R079 Chest pain, unspecified: Secondary | ICD-10-CM

## 2015-06-13 ENCOUNTER — Ambulatory Visit (INDEPENDENT_AMBULATORY_CARE_PROVIDER_SITE_OTHER): Payer: Medicare Other | Admitting: Cardiology

## 2015-06-13 ENCOUNTER — Encounter: Payer: Self-pay | Admitting: Cardiology

## 2015-06-13 VITALS — BP 112/60 | HR 78 | Ht 64.0 in | Wt 147.8 lb

## 2015-06-13 DIAGNOSIS — I10 Essential (primary) hypertension: Secondary | ICD-10-CM | POA: Diagnosis not present

## 2015-06-13 DIAGNOSIS — R079 Chest pain, unspecified: Secondary | ICD-10-CM | POA: Diagnosis not present

## 2015-06-13 NOTE — Patient Instructions (Signed)
Medication Instructions:  Your physician recommends that you continue on your current medications as directed. Please refer to the Current Medication list given to you today.  Labwork: None ordered.  Testing/Procedures: None ordered.  Follow-Up: Your physician recommends that you schedule a follow-up appointment as needed.   Any Other Special Instructions Will Be Listed Below (If Applicable).     If you need a refill on your cardiac medications before your next appointment, please call your pharmacy.   

## 2015-06-13 NOTE — Progress Notes (Signed)
Cardiology Office Note   Date:  06/13/2015   ID:  Tina Silva, DOB 1934/11/30, MRN [id number]  Referring Doctor:  Crecencio Mc, MD   Cardiologist:   Wende Bushy, MD   Reason for consultation:  Chief Complaint  Patient presents with  . Follow-up    f/u to echo      History of Present Illness: Tina Silva is a 81 y.o. female who presents for ffup after tests. No recurrence of CP.  No PND, orthopnea, edema. No palpitations. No loss of consciousness.    ROS:  Please see the history of present illness. Aside from mentioned under HPI, all other systems are reviewed and negative.     Past Medical History  Diagnosis Date  . Hypertension     Past Surgical History  Procedure Laterality Date  . Abdominal hysterectomy    . Appendectomy  1974  . Eye surgery  june 2013    cataract with lens,  dinglein left eye   . Tonsillectomy and adenoidectomy    . Breast biopsy Left 11/04/2005     reports that she has never smoked. She has never used smokeless tobacco. She reports that she does not drink alcohol or use illicit drugs.   family history includes Cancer in her maternal grandmother and sister; Diabetes in her paternal aunt and sister; Heart disease in her father; Parkinson's disease in her mother.   Current Outpatient Prescriptions  Medication Sig Dispense Refill  . aspirin 81 MG tablet Take 81 mg by mouth daily.    . calcium-vitamin D (OSCAL WITH D) 500-200 MG-UNIT tablet Take 1 tablet by mouth.     . Cholecalciferol (VITAMIN D3) 1000 units CAPS Take 1 capsule by mouth.    . fluticasone (FLONASE) 50 MCG/ACT nasal spray Place 2 sprays into both nostrils daily. 16 g 6  . metoprolol tartrate (LOPRESSOR) 25 MG tablet TAKE 1 TABLET BY MOUTH TWICE DAILY 60 tablet 1  . Multiple Vitamin (MULTIVITAMIN) tablet Take 1 tablet by mouth daily.    . risedronate (ACTONEL) 35 MG tablet Take 1 tablet (35 mg total) by mouth every 7 (seven) days. with water on empty stomach, nothing  by mouth or lie down for next 30 minutes. 12 tablet 1  . triamterene-hydrochlorothiazide (MAXZIDE-25) 37.5-25 MG tablet TAKE ONE-HALF TABLET DAILY 30 tablet 2   No current facility-administered medications for this visit.    Allergies: Ciprofloxacin; Flagyl; Iodine; and Sudafed    PHYSICAL EXAM: VS:  BP 112/60 mmHg  Pulse 78  Ht 5\' 4"  (1.626 m)  Wt 147 lb 12.8 oz (67.042 kg)  BMI 25.36 kg/m2  SpO2 98% , Body mass index is 25.36 kg/(m^2). Wt Readings from Last 3 Encounters:  06/13/15 147 lb 12.8 oz (67.042 kg)  05/24/15 147 lb 2 oz (66.735 kg)  05/23/15 147 lb 12.8 oz (67.042 kg)    GENERAL:  well developed, well nourished, not in acute distress HEENT: normocephalic, pink conjunctivae, anicteric sclerae, no xanthelasma, normal dentition, oropharynx clear NECK:  no neck vein engorgement, JVP normal, no hepatojugular reflux, carotid upstroke brisk and symmetric, no bruit, no thyromegaly, no lymphadenopathy LUNGS:  good respiratory effort, clear to auscultation bilaterally CV:  PMI not displaced, no thrills, no lifts, S1 and S2 within normal limits, no palpable S3 or S4, no murmurs, no rubs, no gallops ABD:  Soft, nontender, nondistended, normoactive bowel sounds, no abdominal aortic bruit, no hepatomegaly, no splenomegaly MS: nontender back, no kyphosis, no scoliosis, no joint deformities EXT:  2+ DP/PT pulses, no edema, no varicosities, no cyanosis, no clubbing SKIN: warm, nondiaphoretic, normal turgor, no ulcers NEUROPSYCH: alert, oriented to person, place, and time, sensory/motor grossly intact, normal mood, appropriate affect  Recent Labs: 04/07/2015: ALT 18; BUN 22; Creatinine, Ser 1.09; Potassium 4.2; Sodium 140; TSH 2.29   Lipid Panel    Component Value Date/Time   CHOL 187 04/07/2015 0919   TRIG 115.0 04/07/2015 0919   HDL 50.40 04/07/2015 0919   CHOLHDL 4 04/07/2015 0919   VLDL 23.0 04/07/2015 0919   LDLCALC 114* 04/07/2015 0919     Other studies Reviewed:  EKG:    The ekg from 05/03/2015  was personally reviewed by me and it revealed sinus rhythm 73 BPM  Additional studies/ records that were reviewed personally reviewed by me today include:  Echo 06/08/2015: Left ventricle: The cavity size was normal. There was moderate  concentric hypertrophy. Systolic function was normal. The  estimated ejection fraction was in the range of 60% to 65%. Wall  motion was normal; there were no regional wall motion  abnormalities. Doppler parameters are consistent with abnormal  left ventricular relaxation (grade 1 diastolic dysfunction).  Stress test 06/01/2015:  Blood pressure demonstrated a hypertensive response to exercise. (only 166/56)  Upsloping ST segment depression ST segment depression of 0.5 mm was noted during stress in the II, III, aVF, V5 and V6 leads.  No T wave inversion was noted during stress.  The study is normal.  This is a low risk study.  The left ventricular ejection fraction is hyperdynamic (>65%).  ASSESSMENT AND PLAN: Chest pain, exertional Risk factors for CAD include advanced age, hypertension Echocardiogram and stress tests were unremarkable. Discussed findings with patient in detail. Normal stress test is reassuring. Likelihood of clinically significant CAD is very low. Patient verbalized understanding.  Hypertension BP is well controlled. Continue monitoring BP. Continue current medical therapy and lifestyle changes.   Current medicines are reviewed at length with the patient today.  The patient does not have concerns regarding medicines.  Labs/ tests ordered today include:  No orders of the defined types were placed in this encounter.    I had a lengthy and detailed discussion with the patient regarding diagnoses, prognosis, diagnostic options, treatment options .   I counseled the patient on importance of lifestyle modification including heart healthy diet, regular physical activity.   Disposition:   FU with  undersigned when necessary   Signed, Wende Bushy, MD  06/13/2015 1:26 PM    [facility]

## 2015-07-28 DIAGNOSIS — H93293 Other abnormal auditory perceptions, bilateral: Secondary | ICD-10-CM | POA: Diagnosis not present

## 2015-07-28 DIAGNOSIS — H6123 Impacted cerumen, bilateral: Secondary | ICD-10-CM | POA: Diagnosis not present

## 2015-08-02 ENCOUNTER — Other Ambulatory Visit: Payer: Self-pay | Admitting: Internal Medicine

## 2015-09-02 ENCOUNTER — Other Ambulatory Visit: Payer: Self-pay | Admitting: Internal Medicine

## 2015-09-14 ENCOUNTER — Other Ambulatory Visit: Payer: Self-pay | Admitting: Internal Medicine

## 2015-11-02 ENCOUNTER — Other Ambulatory Visit: Payer: Self-pay | Admitting: Internal Medicine

## 2015-11-22 ENCOUNTER — Telehealth: Payer: Self-pay | Admitting: Cardiology

## 2015-11-22 DIAGNOSIS — Z800 Family history of malignant neoplasm of digestive organs: Secondary | ICD-10-CM | POA: Diagnosis not present

## 2015-11-22 DIAGNOSIS — I10 Essential (primary) hypertension: Secondary | ICD-10-CM | POA: Diagnosis not present

## 2015-11-22 NOTE — Telephone Encounter (Signed)
Cardiac clearance routed through Epic to [place] fax number [phone].

## 2015-11-22 NOTE — Telephone Encounter (Signed)
Did not know the visits were for preop.  No evidence of ischemia on stress test. It was an overall normal study. Pt's caridac risk is low to intermediate. No indication for further testing.

## 2015-11-22 NOTE — Telephone Encounter (Signed)
Received cardiac clearance from [place] for colonoscopy. Will forward to Dr. Yvone Neu for her review.

## 2016-01-02 ENCOUNTER — Other Ambulatory Visit: Payer: Self-pay | Admitting: Internal Medicine

## 2016-02-09 ENCOUNTER — Encounter: Payer: Self-pay | Admitting: *Deleted

## 2016-02-12 ENCOUNTER — Encounter
Admission: RE | Disposition: A | Discharge status: Home / Self Care | Payer: Self-pay | Source: Ambulatory Visit | Attending: Unknown Physician Specialty

## 2016-02-12 ENCOUNTER — Ambulatory Visit: Payer: Medicare Other | Admitting: Anesthesiology

## 2016-02-12 ENCOUNTER — Encounter: Payer: Self-pay | Admitting: Anesthesiology

## 2016-02-12 ENCOUNTER — Ambulatory Visit
Admission: RE | Admit: 2016-02-12 | Discharge: 2016-02-12 | Disposition: A | Discharge status: Home / Self Care | Payer: Medicare Other | Source: Ambulatory Visit | Attending: Unknown Physician Specialty | Admitting: Unknown Physician Specialty

## 2016-02-12 DIAGNOSIS — Z79899 Other long term (current) drug therapy: Secondary | ICD-10-CM | POA: Diagnosis not present

## 2016-02-12 DIAGNOSIS — K5730 Diverticulosis of large intestine without perforation or abscess without bleeding: Secondary | ICD-10-CM | POA: Insufficient documentation

## 2016-02-12 DIAGNOSIS — Z800 Family history of malignant neoplasm of digestive organs: Secondary | ICD-10-CM | POA: Insufficient documentation

## 2016-02-12 DIAGNOSIS — K640 First degree hemorrhoids: Secondary | ICD-10-CM | POA: Diagnosis not present

## 2016-02-12 DIAGNOSIS — Z7982 Long term (current) use of aspirin: Secondary | ICD-10-CM | POA: Insufficient documentation

## 2016-02-12 DIAGNOSIS — I10 Essential (primary) hypertension: Secondary | ICD-10-CM | POA: Diagnosis not present

## 2016-02-12 DIAGNOSIS — K219 Gastro-esophageal reflux disease without esophagitis: Secondary | ICD-10-CM | POA: Insufficient documentation

## 2016-02-12 DIAGNOSIS — Z1211 Encounter for screening for malignant neoplasm of colon: Secondary | ICD-10-CM | POA: Insufficient documentation

## 2016-02-12 HISTORY — PX: COLONOSCOPY WITH PROPOFOL: SHX5780

## 2016-02-12 SURGERY — COLONOSCOPY WITH PROPOFOL
Anesthesia: General

## 2016-02-12 MED ORDER — FENTANYL CITRATE (PF) 100 MCG/2ML IJ SOLN
INTRAMUSCULAR | Status: DC | PRN
  Administered 2016-02-12: 25 ug via INTRAVENOUS

## 2016-02-12 MED ORDER — LIDOCAINE HCL (PF) 2 % IJ SOLN
INTRAMUSCULAR | Status: DC | PRN
  Administered 2016-02-12: 40 mg

## 2016-02-12 MED ORDER — SODIUM CHLORIDE 0.9 % IV SOLN
INTRAVENOUS | Status: DC
  Administered 2016-02-12: 1000 mL via INTRAVENOUS

## 2016-02-12 MED ORDER — MIDAZOLAM HCL 2 MG/2ML IJ SOLN
INTRAMUSCULAR | Status: AC
  Filled 2016-02-12: qty 2

## 2016-02-12 MED ORDER — SODIUM CHLORIDE 0.9 % IV SOLN: INTRAVENOUS | Status: DC

## 2016-02-12 MED ORDER — LIDOCAINE HCL (PF) 1 % IJ SOLN
2.0000 mL | Freq: Once | INTRAMUSCULAR | Status: AC
  Administered 2016-02-12: 0.03 mL via INTRADERMAL
  Filled 2016-02-12: qty 2

## 2016-02-12 MED ORDER — PROPOFOL 10 MG/ML IV BOLUS
INTRAVENOUS | Status: AC
  Filled 2016-02-12: qty 20

## 2016-02-12 MED ORDER — PROPOFOL 10 MG/ML IV BOLUS
INTRAVENOUS | Status: DC | PRN
  Administered 2016-02-12: 30 mg via INTRAVENOUS
  Administered 2016-02-12 (×2): 10 mg via INTRAVENOUS

## 2016-02-12 MED ORDER — FENTANYL CITRATE (PF) 100 MCG/2ML IJ SOLN
INTRAMUSCULAR | Status: AC
  Filled 2016-02-12: qty 2

## 2016-02-12 MED ORDER — PROPOFOL 500 MG/50ML IV EMUL
INTRAVENOUS | Status: DC | PRN
  Administered 2016-02-12: 50 ug/kg/min via INTRAVENOUS

## 2016-02-12 MED ORDER — MIDAZOLAM HCL 5 MG/5ML IJ SOLN
INTRAMUSCULAR | Status: DC | PRN
  Administered 2016-02-12: 1 mg via INTRAVENOUS

## 2016-02-12 NOTE — Anesthesia Postprocedure Evaluation (Signed)
Anesthesia Post Note  Patient: Tina Silva  Procedure(s) Performed: Procedure(s) (LRB): COLONOSCOPY WITH PROPOFOL (N/A)  Patient location during evaluation: Endoscopy Anesthesia Type: General Level of consciousness: awake and alert Pain management: pain level controlled Vital Signs Assessment: post-procedure vital signs reviewed and stable Respiratory status: spontaneous breathing, nonlabored ventilation, respiratory function stable and patient connected to nasal cannula oxygen Cardiovascular status: blood pressure returned to baseline and stable Postop Assessment: no signs of nausea or vomiting Anesthetic complications: no     Last Vitals:  Vitals:   02/12/16 0930 02/12/16 0940  BP: 123/61   Pulse: 62 62  Resp: 15 17  Temp:      Last Pain:  Vitals:   02/12/16 0900  TempSrc: Tympanic                 Precious Haws Harlo Jaso

## 2016-02-12 NOTE — Transfer of Care (Signed)
Immediate Anesthesia Transfer of Care Note  Patient: Tina Silva  Procedure(s) Performed: Procedure(s): COLONOSCOPY WITH PROPOFOL (N/A)  Patient Location: PACU  Anesthesia Type:General  Level of Consciousness: sedated  Airway & Oxygen Therapy: Patient Spontanous Breathing and Patient connected to nasal cannula oxygen  Post-op Assessment: Report given to RN and Post -op Vital signs reviewed and stable  Post vital signs: Reviewed and stable  Last Vitals:  Vitals:   02/12/16 0744 02/12/16 0900  BP: 132/72 (!) 101/54  Pulse: 78 65  Resp: 18 14  Temp: (!) 35.7 C (!) 35.6 C    Last Pain:  Vitals:   02/12/16 0900  TempSrc: Tympanic         Complications: No apparent anesthesia complications

## 2016-02-12 NOTE — Op Note (Signed)
[facility] Gastroenterology Patient Name: Tina Silva Procedure Date: 02/12/2016 8:19 AM MRN: [id number] Account #: [id number] Date of Birth: 10-10-1934 Admit Type: Outpatient Age: 82 Room: [facility] ENDO ROOM 1 Gender: Female Note Status: Finalized Procedure:            Colonoscopy Indications:          Screening in patient at increased risk: Family history                        of 1st-degree relative with colorectal cancer Providers:            Manya Silvas, MD Referring MD:         Deborra Medina, MD (Referring MD) Medicines:            Propofol per Anesthesia Complications:        No immediate complications. Procedure:            Pre-Anesthesia Assessment:                       - After reviewing the risks and benefits, the patient                        was deemed in satisfactory condition to undergo the                        procedure.                       After obtaining informed consent, the colonoscope was                        passed under direct vision. Throughout the procedure,                        the patient's blood pressure, pulse, and oxygen                        saturations were monitored continuously. The                        Colonoscope was introduced through the anus and                        advanced to the the cecum, identified by appendiceal                        orifice and ileocecal valve. The colonoscopy was                        somewhat difficult due to restricted mobility of the                        colon and a tortuous colon. The patient tolerated the                        procedure well. The quality of the bowel preparation                        was excellent. Findings:      The sigmoid colon was very tortuous and I could not pass the  adult scope       so I switched to an EGD scope and was able to pass to the cecum with       pictures of appendiceal orifice and ileocecal valve.      Multiple small-mouthed  diverticula were found in the sigmoid colon.      Internal hemorrhoids were found during endoscopy. The hemorrhoids were       small and Grade I (internal hemorrhoids that do not prolapse).      The exam was otherwise without abnormality. Impression:           - Diverticulosis in the sigmoid colon.                       - Internal hemorrhoids.                       - The examination was otherwise normal.                       - No specimens collected. Recommendation:       Due to age no routine repeat colonoscopy recommended.                       - The findings and recommendations were discussed with                        the patient's family. Manya Silvas, MD 02/12/2016 9:01:09 AM This report has been signed electronically. Number of Addenda: 0 Note Initiated On: 02/12/2016 8:19 AM Scope Withdrawal Time: 0 hours 5 minutes 38 seconds  Total Procedure Duration: 0 hours 32 minutes 48 seconds       [facility]

## 2016-02-12 NOTE — H&P (Signed)
Primary Care Physician:  Crecencio Mc, MD Primary Gastroenterologist:  Dr. Vira Agar  Pre-Procedure History & Physical: HPI:  Tina Silva is a 82 y.o. female is here for an colonoscopy.   Past Medical History:  Diagnosis Date  . Hypertension     Past Surgical History:  Procedure Laterality Date  . ABDOMINAL HYSTERECTOMY    . APPENDECTOMY  1974  . BREAST BIOPSY Left 11/04/2005  . EYE SURGERY  june 2013   cataract with lens,  dinglein left eye   . TONSILLECTOMY    . TONSILLECTOMY AND ADENOIDECTOMY      Prior to Admission medications   Medication Sig Start Date End Date Taking? Authorizing Provider  aspirin 81 MG tablet Take 81 mg by mouth daily.    Historical Provider, MD  calcium-vitamin D (OSCAL WITH D) 500-200 MG-UNIT tablet Take 1 tablet by mouth.     Historical Provider, MD  Cholecalciferol (VITAMIN D3) 1000 units CAPS Take 1 capsule by mouth.    Historical Provider, MD  fluticasone (FLONASE) 50 MCG/ACT nasal spray Place 2 sprays into both nostrils daily. 05/24/15   Coral Spikes, DO  metoprolol tartrate (LOPRESSOR) 25 MG tablet Take 1 tablet (25 mg total) by mouth 2 (two) times daily. Need appt with Dr. Derrel Nip for additional refills 01/02/16   Crecencio Mc, MD  Multiple Vitamin (MULTIVITAMIN) tablet Take 1 tablet by mouth daily.    Historical Provider, MD  risedronate (ACTONEL) 35 MG tablet TAKE ONE TABLET WEEKLY SAME DAY OF EACH WEEK--TAKE FIRST THING ON ARISING WITH A FULL GLASS OF WATER-STAY UP 30 MINUTES 09/15/15   Crecencio Mc, MD  triamterene-hydrochlorothiazide ([id number]) 37.5-25 MG tablet TAKE 1/2 TABLET BY MOUTH DAILY 11/02/15   Crecencio Mc, MD    Allergies as of 01/25/2016 - Review Complete 06/13/2015  Allergen Reaction Noted  . Ciprofloxacin  08/30/2011  . Flagyl [metronidazole]  08/30/2011  . Iodine  08/30/2011  . Sudafed [pseudoephedrine hcl]  08/30/2011    Family History  Problem Relation Age of Onset  . Parkinson's disease Mother   . Cancer  Sister     AML  . Diabetes Sister   . Heart disease Father   . Diabetes Paternal Aunt   . Cancer Maternal Grandmother     Social History   Social History  . Marital status: Single    Spouse name: N/A  . Number of children: N/A  . Years of education: N/A   Occupational History  . Not on file.   Social History Main Topics  . Smoking status: Never Smoker  . Smokeless tobacco: Never Used  . Alcohol use No  . Drug use: No  . Sexual activity: No   Other Topics Concern  . Not on file   Social History Narrative  . No narrative on file    Review of Systems: See HPI, otherwise negative ROS  Physical Exam: BP 132/72   Pulse 78   Temp (!) 96.3 F (35.7 C) (Tympanic)   Resp 18   Ht 5\' 4"  (1.626 m)   Wt 64.4 kg (142 lb)   SpO2 100%   BMI 24.37 kg/m  General:   Alert,  pleasant and cooperative in NAD Head:  Normocephalic and atraumatic. Neck:  Supple; no masses or thyromegaly. Lungs:  Clear throughout to auscultation.    Heart:  Regular rate and rhythm. Abdomen:  Soft, nontender and nondistended. Normal bowel sounds, without guarding, and without rebound.   Neurologic:  Alert and  oriented x4;  grossly normal neurologically.  Impression/Plan: Eutha Corrado Koudelka is here for an colonoscopy to be performed for family history of colon cancer  Risks, benefits, limitations, and alternatives regarding  colonoscopy have been reviewed with the patient.  Questions have been answered.  All parties agreeable.   Gaylyn Cheers, MD  02/12/2016, 8:15 AM

## 2016-02-12 NOTE — Anesthesia Preprocedure Evaluation (Signed)
Anesthesia Evaluation  Patient identified by MRN, date of birth, ID band Patient awake    Reviewed: Allergy & Precautions, H&P , NPO status , Patient's Chart, lab work & pertinent test results  History of Anesthesia Complications (+) PONV and history of anesthetic complications  Airway Mallampati: III  TM Distance: <3 FB Neck ROM: limited    Dental  (+) Poor Dentition, Chipped   Pulmonary neg pulmonary ROS,    Pulmonary exam normal breath sounds clear to auscultation       Cardiovascular Exercise Tolerance: Good hypertension, Normal cardiovascular exam Rhythm:regular Rate:Normal     Neuro/Psych negative neurological ROS  negative psych ROS   GI/Hepatic Neg liver ROS, GERD  Controlled,  Endo/Other  negative endocrine ROS  Renal/GU negative Renal ROS  negative genitourinary   Musculoskeletal   Abdominal   Peds  Hematology negative hematology ROS (+)   Anesthesia Other Findings Past Medical History: No date: Hypertension  Past Surgical History: No date: ABDOMINAL HYSTERECTOMY 1974: APPENDECTOMY 11/04/2005: BREAST BIOPSY Left june 2013: EYE SURGERY     Comment: cataract with lens,  dinglein left eye  No date: TONSILLECTOMY No date: TONSILLECTOMY AND ADENOIDECTOMY  BMI    Body Mass Index:  24.37 kg/m      Reproductive/Obstetrics negative OB ROS                             Anesthesia Physical Anesthesia Plan  ASA: III  Anesthesia Plan: General   Post-op Pain Management:    Induction:   Airway Management Planned:   Additional Equipment:   Intra-op Plan:   Post-operative Plan:   Informed Consent: I have reviewed the patients History and Physical, chart, labs and discussed the procedure including the risks, benefits and alternatives for the proposed anesthesia with the patient or authorized representative who has indicated his/her understanding and acceptance.   Dental  Advisory Given  Plan Discussed with: Anesthesiologist, CRNA and Surgeon  Anesthesia Plan Comments:         Anesthesia Quick Evaluation

## 2016-02-13 ENCOUNTER — Encounter: Payer: Self-pay | Admitting: Unknown Physician Specialty

## 2016-02-27 ENCOUNTER — Ambulatory Visit (INDEPENDENT_AMBULATORY_CARE_PROVIDER_SITE_OTHER): Payer: Medicare Other | Admitting: Family

## 2016-02-27 ENCOUNTER — Encounter: Payer: Self-pay | Admitting: Family

## 2016-02-27 VITALS — BP 118/68 | HR 81 | Temp 98.0°F | Ht 64.0 in | Wt 144.2 lb

## 2016-02-27 DIAGNOSIS — J40 Bronchitis, not specified as acute or chronic: Secondary | ICD-10-CM

## 2016-02-27 MED ORDER — GUAIFENESIN ER 600 MG PO TB12: 1200.0000 mg | ORAL_TABLET | Freq: Two times a day (BID) | ORAL | 3 refills | Status: DC

## 2016-02-27 MED ORDER — BENZONATATE 100 MG PO CAPS: 100.0000 mg | ORAL_CAPSULE | Freq: Two times a day (BID) | ORAL | 0 refills | Status: DC | PRN

## 2016-02-27 NOTE — Progress Notes (Signed)
Pre visit review using our clinic review tool, if applicable. No additional management support is needed unless otherwise documented below in the visit note. 

## 2016-02-27 NOTE — Patient Instructions (Signed)
Start mucinex, tessalon perles  Let's treat conservatively at first and if no improvement, let me know.   Increase intake of clear fluids. Congestion is best treated by hydration, when mucus is wetter, it is thinner, less sticky, and easier to expel from the body, either through coughing up drainage, or by blowing your nose.   Get plenty of rest.   Use saline nasal drops and blow your nose frequently. Run a humidifier at night and elevate the head of the bed. Vicks Vapor rub will help with congestion and cough. Steam showers and sinus massage for congestion.   Use Acetaminophen or Ibuprofen as needed for fever or pain. Avoid second hand smoke. Even the smallest exposure will worsen symptoms.   You can also try a teaspoon of honey to see if this will help reduce cough. Throat lozenges can sometimes be beneficial as well.    This illness will typically last 7 - 10 days.   Please follow up with our clinic if you develop a fever greater than 101 F, symptoms worsen, or do not resolve in the next week.

## 2016-02-27 NOTE — Progress Notes (Signed)
Subjective:    Patient ID: Tina Silva, female    DOB: 1934-04-28, 82 y.o.   MRN: [id number]  CC: Tina Silva is a 82 y.o. female who presents today for an acute visit.    HPI: CC: productive cough, congestion x 9 days, unchanged.  Endorses post nasal drip. Also intermittant right side throat pain from congestion in back of throat h ( resolved today).   No fever, SOB, wheezing, HA, vision changes. Hasn't tried any medication.   No lung disease. No h/o PNA.     HISTORY:  Past Medical History:  Diagnosis Date  . Hypertension    Past Surgical History:  Procedure Laterality Date  . ABDOMINAL HYSTERECTOMY    . APPENDECTOMY  1974  . BREAST BIOPSY Left 11/04/2005  . COLONOSCOPY WITH PROPOFOL N/A 02/12/2016   Procedure: COLONOSCOPY WITH PROPOFOL;  Surgeon: Manya Silvas, MD;  Location[facility] ENDOSCOPY;  Service: Endoscopy;  Laterality: N/A;  . EYE SURGERY  june 2013   cataract with lens,  dinglein left eye   . TONSILLECTOMY    . TONSILLECTOMY AND ADENOIDECTOMY     Family History  Problem Relation Age of Onset  . Parkinson's disease Mother   . Cancer Sister     AML  . Diabetes Sister   . Heart disease Father   . Diabetes Paternal Aunt   . Cancer Maternal Grandmother     Allergies: Ciprofloxacin; Doxycycline; Flagyl [metronidazole]; Iodine; and Sudafed [pseudoephedrine hcl] Current Outpatient Prescriptions on File Prior to Visit  Medication Sig Dispense Refill  . aspirin 81 MG tablet Take 81 mg by mouth daily.    . calcium-vitamin D (OSCAL WITH D) 500-200 MG-UNIT tablet Take 1 tablet by mouth.     . Cholecalciferol (VITAMIN D3) 1000 units CAPS Take 1 capsule by mouth.    . fluticasone (FLONASE) 50 MCG/ACT nasal spray Place 2 sprays into both nostrils daily. 16 g 6  . metoprolol tartrate (LOPRESSOR) 25 MG tablet Take 1 tablet (25 mg total) by mouth 2 (two) times daily. Need appt with Dr. Derrel Nip for additional refills 60 tablet 3  . Multiple Vitamin (MULTIVITAMIN) tablet  Take 1 tablet by mouth daily.    . risedronate (ACTONEL) 35 MG tablet TAKE ONE TABLET WEEKLY SAME DAY OF EACH WEEK--TAKE FIRST THING ON ARISING WITH A FULL GLASS OF WATER-STAY UP 30 MINUTES 12 tablet 2  . triamterene-hydrochlorothiazide (MAXZIDE-25) 37.5-25 MG tablet TAKE 1/2 TABLET BY MOUTH DAILY 30 tablet 1   No current facility-administered medications on file prior to visit.     Social History  Substance Use Topics  . Smoking status: Never Smoker  . Smokeless tobacco: Never Used  . Alcohol use No    Review of Systems  Constitutional: Negative for chills and fever.  HENT: Positive for congestion.   Respiratory: Positive for cough.   Cardiovascular: Negative for chest pain and palpitations.  Gastrointestinal: Negative for nausea and vomiting.      Objective:    BP 118/68   Pulse 81   Temp 98 F (36.7 C) (Oral)   Ht 5\' 4"  (1.626 m)   Wt 144 lb 3.2 oz (65.4 kg)   SpO2 97%   BMI 24.75 kg/m    Physical Exam  Constitutional: She appears well-developed and well-nourished.  HENT:  Head: Normocephalic and atraumatic.  Right Ear: Hearing, tympanic membrane, external ear and ear canal normal. No drainage, swelling or tenderness. No foreign bodies. Tympanic membrane is not erythematous and not bulging. No  middle ear effusion. No decreased hearing is noted.  Left Ear: Hearing, tympanic membrane, external ear and ear canal normal. No drainage, swelling or tenderness. No foreign bodies. Tympanic membrane is not erythematous and not bulging.  No middle ear effusion. No decreased hearing is noted.  Nose: Rhinorrhea present. Right sinus exhibits no maxillary sinus tenderness and no frontal sinus tenderness. Left sinus exhibits no maxillary sinus tenderness and no frontal sinus tenderness.  Mouth/Throat: Uvula is midline, oropharynx is clear and moist and mucous membranes are normal. No oropharyngeal exudate, posterior oropharyngeal edema, posterior oropharyngeal erythema or tonsillar  abscesses.  Eyes: Conjunctivae are normal.  Cardiovascular: Regular rhythm, normal heart sounds and normal pulses.   Pulmonary/Chest: Effort normal and breath sounds normal. She has no wheezes. She has no rhonchi. She has no rales.  Lymphadenopathy:       Head (right side): No submental, no submandibular, no tonsillar, no preauricular, no posterior auricular and no occipital adenopathy present.       Head (left side): No submental, no submandibular, no tonsillar, no preauricular, no posterior auricular and no occipital adenopathy present.    She has no cervical adenopathy.  Neurological: She is alert.  Skin: Skin is warm and dry.  Psychiatric: She has a normal mood and affect. Her speech is normal and behavior is normal. Thought content normal.  Vitals reviewed.      Assessment & Plan:   1. Bronchitis Afebrile. Suspect viral illness. Patient and I jointly agreed on conservative management with trial of Mucinex, Tessalon Perles as needed. Patient will let me know if she's not better.  - guaiFENesin (MUCINEX) 600 MG 12 hr tablet; Take 2 tablets (1,200 mg total) by mouth 2 (two) times daily.  Dispense: 28 tablet; Refill: 3 - benzonatate (TESSALON) 100 MG capsule; Take 1 capsule (100 mg total) by mouth 2 (two) times daily as needed for cough.  Dispense: 20 capsule; Refill: 0    I am having Tina Silva start on guaiFENesin and benzonatate. I am also having her maintain her multivitamin, aspirin, calcium-vitamin D, Vitamin D3, fluticasone, risedronate, triamterene-hydrochlorothiazide, and metoprolol tartrate.   Meds ordered this encounter  Medications  . guaiFENesin (MUCINEX) 600 MG 12 hr tablet    Sig: Take 2 tablets (1,200 mg total) by mouth 2 (two) times daily.    Dispense:  28 tablet    Refill:  3    Order Specific Question:   Supervising Provider    Answer:   Deborra Medina L [[id number]]  . benzonatate (TESSALON) 100 MG capsule    Sig: Take 1 capsule (100 mg total) by mouth 2 (two)  times daily as needed for cough.    Dispense:  20 capsule    Refill:  0    Order Specific Question:   Supervising Provider    Answer:   Crecencio Mc [[id number]]    Return precautions given.   Risks, benefits, and alternatives of the medications and treatment plan prescribed today were discussed, and patient expressed understanding.   Education regarding symptom management and diagnosis given to patient on AVS.  Continue to follow with TULLO, Aris Everts, MD for routine health maintenance.   Nonda Lou Courtois and I agreed with plan.   Mable Paris, FNP

## 2016-02-29 ENCOUNTER — Ambulatory Visit: Payer: Medicare Other

## 2016-02-29 ENCOUNTER — Telehealth: Payer: Self-pay

## 2016-02-29 VITALS — BP 118/66 | HR 86 | Temp 99.1°F | Resp 14

## 2016-02-29 DIAGNOSIS — J029 Acute pharyngitis, unspecified: Secondary | ICD-10-CM

## 2016-02-29 MED ORDER — AMOXICILLIN 500 MG PO CAPS: 500.0000 mg | ORAL_CAPSULE | Freq: Two times a day (BID) | ORAL | 0 refills | Status: AC

## 2016-02-29 NOTE — Progress Notes (Signed)
Patient walked in stating throat is sore hurts to swallow. Last seen on 02/27/2016.   Patient is having nasal drainage with blood.  She denies having chest congestion or cough.  Patient denies shortness of breath.    She is choking on sputum which is green in color. She continues to take Mucinex however she stopped Tessalon due to no longer coughing.   Spoke with Mable Paris, NP  and she agreed she looked at chart and called in antibiotic and patient was advised if she doesn't improve to follow up with office.

## 2016-03-04 ENCOUNTER — Other Ambulatory Visit: Payer: Self-pay | Admitting: Internal Medicine

## 2016-03-18 ENCOUNTER — Telehealth: Payer: Self-pay | Admitting: *Deleted

## 2016-03-18 NOTE — Telephone Encounter (Signed)
The earliest  I can se her is 430 Wednesday

## 2016-03-18 NOTE — Telephone Encounter (Signed)
Pt requested to see Dr. Derrel Nip, Pt currently has a sore throat and cough that has been going on since the first of the month. Pt has been treating in the office. Pt requested a call  Pt contact [phone]

## 2016-03-18 NOTE — Telephone Encounter (Signed)
Spoke with patient and advised she agreed to come in for appointment.  Can you placed on schedule for appointment on Wednesday at 4:30pm ?  Thanks

## 2016-03-18 NOTE — Telephone Encounter (Signed)
Reason for call: sore throat, cough Symptoms: sore throat right sided, hurts to swallow when eats, ? Enlarged lymph node right side   cough non productive,  Teeth pain,  Duration 02/27/2016 Medications: mucinex  Last seen for this problem: 02/17/2016 Seen by: Mable Paris, FNP Only wants to see Dr Derrel Nip, please advise.

## 2016-03-20 ENCOUNTER — Ambulatory Visit (INDEPENDENT_AMBULATORY_CARE_PROVIDER_SITE_OTHER): Payer: Medicare Other

## 2016-03-20 ENCOUNTER — Encounter: Payer: Self-pay | Admitting: Internal Medicine

## 2016-03-20 ENCOUNTER — Ambulatory Visit (INDEPENDENT_AMBULATORY_CARE_PROVIDER_SITE_OTHER): Payer: Medicare Other | Admitting: Internal Medicine

## 2016-03-20 VITALS — BP 136/80 | HR 75 | Temp 98.1°F | Resp 16 | Wt 146.0 lb

## 2016-03-20 DIAGNOSIS — J40 Bronchitis, not specified as acute or chronic: Secondary | ICD-10-CM

## 2016-03-20 DIAGNOSIS — J029 Acute pharyngitis, unspecified: Secondary | ICD-10-CM | POA: Diagnosis not present

## 2016-03-20 DIAGNOSIS — R05 Cough: Secondary | ICD-10-CM | POA: Diagnosis not present

## 2016-03-20 DIAGNOSIS — R059 Cough, unspecified: Secondary | ICD-10-CM

## 2016-03-20 MED ORDER — GUAIFENESIN-CODEINE 100-10 MG/5ML PO SYRP: 5.0000 mL | ORAL_SOLUTION | Freq: Three times a day (TID) | ORAL | 0 refills | Status: DC | PRN

## 2016-03-20 MED ORDER — PREDNISONE 10 MG PO TABS: ORAL_TABLET | ORAL | 0 refills | Status: DC

## 2016-03-20 NOTE — Patient Instructions (Addendum)
Your sore throat does not appear to be due to strep throat based on your exam and your history,  It may  Be coming from your persistent cough  I will repeat a round of antibiotics if your chest x ray is abnormal.  If it is normal  I am going to try a 6 day steroid taper and a cough suppressant

## 2016-03-20 NOTE — Progress Notes (Signed)
Pre visit review using our clinic review tool, if applicable. No additional management support is needed unless otherwise documented below in the visit note. 

## 2016-03-20 NOTE — Progress Notes (Signed)
Subjective:  Patient ID: Tina Silva, female    DOB: [birth year]  Age: 82 y.o. MRN: [id number]  CC: The primary encounter diagnosis was Bronchitis. Diagnoses of Pharyngitis, unspecified etiology and Cough were also pertinent to this visit.  HPI Tina Silva Mosteller presents for cough and sore throat  Started in late January after sitting in a cold environment in church . Developed a hacking cough that became productive of green sputum. (sinuses were draining clear liquid) .  After 9 days was treated by margarett for viral uri,   After 2 more days started amxocillin ,  Which produced a resolution of of purulent sputm after 2 days,  However she continues to have a dry cough and a very sore throat  No fevers, pleurisy or wheezing  Lungs clear, no egophony,  Throat red but no edema. S/p tonsillectomy. No  patches Right side d cervical lymphadenoappathy   Outpatient Medications Prior to Visit  Medication Sig Dispense Refill  . aspirin 81 MG tablet Take 81 mg by mouth daily.    . calcium-vitamin D (OSCAL WITH D) 500-200 MG-UNIT tablet Take 1 tablet by mouth.     . Cholecalciferol (VITAMIN D3) 1000 units CAPS Take 1 capsule by mouth.    . fluticasone (FLONASE) 50 MCG/ACT nasal spray Place 2 sprays into both nostrils daily. 16 g 6  . metoprolol tartrate (LOPRESSOR) 25 MG tablet Take 1 tablet (25 mg total) by mouth 2 (two) times daily. Need appt with Dr. Derrel Nip for additional refills 60 tablet 3  . Multiple Vitamin (MULTIVITAMIN) tablet Take 1 tablet by mouth daily.    . risedronate (ACTONEL) 35 MG tablet TAKE ONE TABLET WEEKLY SAME DAY OF EACH WEEK--TAKE FIRST THING ON ARISING WITH A FULL GLASS OF WATER-STAY UP 30 MINUTES 12 tablet 2  . triamterene-hydrochlorothiazide (MAXZIDE-25) 37.5-25 MG tablet TAKE 1/2 TABLET BY MOUTH ONCE DAILY AS DIRECTED BY PHYSICIAN 30 tablet 0  . benzonatate (TESSALON) 100 MG capsule Take 1 capsule (100 mg total) by mouth 2 (two) times daily as needed for cough. (Patient not  taking: Reported on 03/20/2016) 20 capsule 0  . guaiFENesin (MUCINEX) 600 MG 12 hr tablet Take 2 tablets (1,200 mg total) by mouth 2 (two) times daily. (Patient not taking: Reported on 03/20/2016) 28 tablet 3   No facility-administered medications prior to visit.     Review of Systems;  Patient denies headache, fevers, malaise, unintentional weight loss, skin rash, eye pain, sinus congestion and sinus pain, sore throat, dysphagia,  hemoptysis , cough, dyspnea, wheezing, chest pain, palpitations, orthopnea, edema, abdominal pain, nausea, melena, diarrhea, constipation, flank pain, dysuria, hematuria, urinary  Frequency, nocturia, numbness, tingling, seizures,  Focal weakness, Loss of consciousness,  Tremor, insomnia, depression, anxiety, and suicidal ideation.      Objective:  BP 136/80   Pulse 75   Temp 98.1 F (36.7 C) (Oral)   Resp 16   Wt 146 lb (66.2 kg)   SpO2 99%   BMI 25.06 kg/m   BP Readings from Last 3 Encounters:  03/20/16 136/80  02/29/16 118/66  02/27/16 118/68    Wt Readings from Last 3 Encounters:  03/20/16 146 lb (66.2 kg)  02/27/16 144 lb 3.2 oz (65.4 kg)  02/12/16 142 lb (64.4 kg)    General appearance: alert, cooperative and appears stated age Ears: normal TM's and external ear canals both ears Throat: lips, mucosa, and tongue normal; teeth and gums normal Neck: , no carotid bruit, supple, symmetrical, trachea midline and thyroid not  enlarged, symmetric, no tenderness/mass/nodules Back: symmetric, no curvature. ROM normal. No CVA tenderness. Lungs: clear to auscultation bilaterally Heart: regular rate and rhythm, S1, S2 normal, no murmur, click, rub or gallop Abdomen: soft, non-tender; bowel sounds normal; no masses,  no organomegaly Pulses: 2+ and symmetric Skin: Skin color, texture, turgor normal. No rashes or lesions Lymph nodes: Cervical LAD on rigth,  None on left   and axillary nodes normal.  Lab Results  Component Value Date   HGBA1C 5.6  01/10/2012    Lab Results  Component Value Date   CREATININE 1.09 04/07/2015   CREATININE 1.06 08/10/2014   CREATININE 1.0 07/06/2013    Lab Results  Component Value Date   WBC 7.2 03/20/2016   HGB 13.6 03/20/2016   HCT 40.1 03/20/2016   PLT 165.0 03/20/2016   GLUCOSE 99 04/07/2015   CHOL 187 04/07/2015   TRIG 115.0 04/07/2015   HDL 50.40 04/07/2015   LDLCALC 114 (H) 04/07/2015   ALT 18 04/07/2015   AST 20 04/07/2015   NA 140 04/07/2015   K 4.2 04/07/2015   CL 102 04/07/2015   CREATININE 1.09 04/07/2015   BUN 22 04/07/2015   CO2 31 04/07/2015   TSH 2.29 04/07/2015   HGBA1C 5.6 01/10/2012    No results found.  Assessment & Plan:   Problem List Items Addressed This Visit    Cough    Chest x ray done due to persistent cough x  4 weeks. Mild hyperinflation noted,  No infiltrates,  prednisone taper and Cheratussin       Pharyngitis    Throat exam is normal other than mild erythema and cervical LAD>  Cbc is normal .  Will treat as irritation from persistent  Cough with prednisone and Cheratussin        Other Visit Diagnoses    Bronchitis    -  Primary   Relevant Orders   DG Chest 2 View (Completed)   CBC with Differential/Platelet (Completed)   Legionella Antigen, Urine      I am having Ms. Salak start on guaiFENesin-codeine and predniSONE. I am also having her maintain her multivitamin, aspirin, calcium-vitamin D, Vitamin D3, fluticasone, risedronate, metoprolol tartrate, guaiFENesin, benzonatate, and triamterene-hydrochlorothiazide.  Meds ordered this encounter  Medications  . guaiFENesin-codeine (CHERATUSSIN AC) 100-10 MG/5ML syrup    Sig: Take 5 mLs by mouth 3 (three) times daily as needed for cough.    Dispense:  120 mL    Refill:  0  . predniSONE (DELTASONE) 10 MG tablet    Sig: 6 tablets on Day 1 , then reduce by 1 tablet daily until gone    Dispense:  21 tablet    Refill:  0    There are no discontinued medications.  Follow-up: No Follow-up  on file.   Crecencio Mc, MD

## 2016-03-21 ENCOUNTER — Encounter: Payer: Self-pay | Admitting: Internal Medicine

## 2016-03-21 ENCOUNTER — Telehealth: Payer: Self-pay

## 2016-03-21 DIAGNOSIS — J029 Acute pharyngitis, unspecified: Secondary | ICD-10-CM | POA: Insufficient documentation

## 2016-03-21 LAB — CBC WITH DIFFERENTIAL/PLATELET
Basophils Absolute: 0 10*3/uL (ref 0.0–0.1)
Basophils Relative: 0.3 % (ref 0.0–3.0)
EOS ABS: 0.2 10*3/uL (ref 0.0–0.7)
Eosinophils Relative: 2.2 % (ref 0.0–5.0)
HCT: 40.1 % (ref 36.0–46.0)
HEMOGLOBIN: 13.6 g/dL (ref 12.0–15.0)
LYMPHS ABS: 1.7 10*3/uL (ref 0.7–4.0)
Lymphocytes Relative: 23.6 % (ref 12.0–46.0)
MCHC: 33.9 g/dL (ref 30.0–36.0)
MCV: 96.4 fl (ref 78.0–100.0)
MONO ABS: 0.5 10*3/uL (ref 0.1–1.0)
Monocytes Relative: 7.3 % (ref 3.0–12.0)
NEUTROS PCT: 66.6 % (ref 43.0–77.0)
Neutro Abs: 4.8 10*3/uL (ref 1.4–7.7)
Platelets: 165 10*3/uL (ref 150.0–400.0)
RBC: 4.16 Mil/uL (ref 3.87–5.11)
RDW: 12.6 % (ref 11.5–15.5)
WBC: 7.2 10*3/uL (ref 4.0–10.5)

## 2016-03-21 LAB — LEGIONELLA ANTIGEN, URINE
RESULTS - LGAGUR: NEGATIVE
RESULTS - LGAGUR: NOT DETECTED
Result - LGAGUR: DETECTED

## 2016-03-21 MED ORDER — OMEPRAZOLE 20 MG PO CPDR: 20.0000 mg | DELAYED_RELEASE_CAPSULE | Freq: Every day | ORAL | 3 refills | Status: DC

## 2016-03-21 NOTE — Assessment & Plan Note (Signed)
Chest x ray done due to persistent cough x  4 weeks. Mild hyperinflation noted,  No infiltrates,  prednisone taper and Cheratussin

## 2016-03-21 NOTE — Assessment & Plan Note (Signed)
Throat exam is normal other than mild erythema and cervical LAD>  Cbc is normal .  Will treat as irritation from persistent  Cough with prednisone and Cheratussin

## 2016-03-21 NOTE — Telephone Encounter (Signed)
Given her recent visit for sore throat, with nothing on exam to explain it,  The culprit could be the risedronate, since it can cause a pill esophagitis (inflammation of the esophagus) .  Have her suspend it for 2 months, and take omeprazole 20 mg daily, (along with the prednisone I prescribed yesterday).  I will send rx for omeprazole  to pharmacy .

## 2016-03-21 NOTE — Telephone Encounter (Signed)
Patient has been notified

## 2016-03-21 NOTE — Telephone Encounter (Signed)
Refill request for Risedronate, last seen [id number], last filled [id number].  Please advise.

## 2016-03-25 ENCOUNTER — Ambulatory Visit: Payer: Medicare Other

## 2016-03-26 ENCOUNTER — Ambulatory Visit (INDEPENDENT_AMBULATORY_CARE_PROVIDER_SITE_OTHER): Payer: Medicare Other

## 2016-03-26 VITALS — BP 128/74 | HR 60 | Temp 98.3°F | Resp 14 | Ht 64.0 in | Wt 146.4 lb

## 2016-03-26 DIAGNOSIS — Z0000 Encounter for general adult medical examination without abnormal findings: Secondary | ICD-10-CM

## 2016-03-26 NOTE — Patient Instructions (Addendum)
  Tina Silva , Thank you for taking time to come for your Medicare Wellness Visit. I appreciate your ongoing commitment to your health goals. Please review the following plan we discussed and let me know if I can assist you in the future.   Follow up with Dr. Derrel Nip as needed.    Have a great day!  These are the goals we discussed: Goals    . Healthy Lifestyle          Maintain walking regimen Stay hydrated, drink plenty of fluids Low carb foods and lean meats         This is a list of the screening recommended for you and due dates:  Health Maintenance  Topic Date Due  . Tetanus Vaccine  12/29/2021  . Flu Shot  Completed  . DEXA scan (bone density measurement)  Completed  . Pneumonia vaccines  Completed

## 2016-03-26 NOTE — Progress Notes (Signed)
Subjective:   Tina Silva is a 82 y.o. female who presents for Medicare Annual (Subsequent) preventive examination.  Review of Systems:  No ROS.  Medicare Wellness Visit.  Cardiac Risk Factors include: advanced age (>7men, >25 women);hypertension     Objective:     Vitals: BP 128/74 (BP Location: Left Arm, Patient Position: Sitting, Cuff Size: Normal)   Pulse 60   Temp 98.3 F (36.8 C) (Oral)   Resp 14   Ht 5\' 4"  (1.626 m)   Wt 146 lb 6.4 oz (66.4 kg)   SpO2 98%   BMI 25.13 kg/m   Body mass index is 25.13 kg/m.   Tobacco History  Smoking Status  . Never Smoker  Smokeless Tobacco  . Never Used     Counseling given: Not Answered   Past Medical History:  Diagnosis Date  . Hypertension    Past Surgical History:  Procedure Laterality Date  . ABDOMINAL HYSTERECTOMY    . APPENDECTOMY  1974  . BREAST BIOPSY Left 11/04/2005  . COLONOSCOPY WITH PROPOFOL N/A 02/12/2016   Procedure: COLONOSCOPY WITH PROPOFOL;  Surgeon: Manya Silvas, MD;  Location: [facility] ENDOSCOPY;  Service: Endoscopy;  Laterality: N/A;  . EYE SURGERY  june 2013   cataract with lens,  dinglein left eye   . TONSILLECTOMY    . TONSILLECTOMY AND ADENOIDECTOMY     Family History  Problem Relation Age of Onset  . Parkinson's disease Mother   . Cancer Sister     AML  . Diabetes Sister   . Heart disease Father   . Diabetes Paternal Aunt   . Cancer Maternal Grandmother    History  Sexual Activity  . Sexual activity: No    Outpatient Encounter Prescriptions as of 03/26/2016  Medication Sig  . aspirin 81 MG tablet Take 81 mg by mouth daily.  . calcium-vitamin D (OSCAL WITH D) 500-200 MG-UNIT tablet Take 1 tablet by mouth.   . Cholecalciferol (VITAMIN D3) 1000 units CAPS Take 1 capsule by mouth.  . fluticasone (FLONASE) 50 MCG/ACT nasal spray Place 2 sprays into both nostrils daily.  . metoprolol tartrate (LOPRESSOR) 25 MG tablet Take 1 tablet (25 mg total) by mouth 2 (two) times daily. Need  appt with Dr. Derrel Nip for additional refills  . Multiple Vitamin (MULTIVITAMIN) tablet Take 1 tablet by mouth daily.  Marland Kitchen omeprazole (PRILOSEC) 20 MG capsule Take 1 capsule (20 mg total) by mouth daily.  . risedronate (ACTONEL) 35 MG tablet TAKE ONE TABLET WEEKLY SAME DAY OF EACH WEEK--TAKE FIRST THING ON ARISING WITH A FULL GLASS OF WATER-STAY UP 30 MINUTES  . triamterene-hydrochlorothiazide (MAXZIDE-25) 37.5-25 MG tablet TAKE 1/2 TABLET BY MOUTH ONCE DAILY AS DIRECTED BY PHYSICIAN  . [DISCONTINUED] benzonatate (TESSALON) 100 MG capsule Take 1 capsule (100 mg total) by mouth 2 (two) times daily as needed for cough.  . [DISCONTINUED] guaiFENesin (MUCINEX) 600 MG 12 hr tablet Take 2 tablets (1,200 mg total) by mouth 2 (two) times daily.  . [DISCONTINUED] guaiFENesin-codeine (CHERATUSSIN AC) 100-10 MG/5ML syrup Take 5 mLs by mouth 3 (three) times daily as needed for cough.  . [DISCONTINUED] predniSONE (DELTASONE) 10 MG tablet 6 tablets on Day 1 , then reduce by 1 tablet daily until gone   No facility-administered encounter medications on file as of 03/26/2016.     Activities of Daily Living In your present state of health, do you have any difficulty performing the following activities: 03/26/2016  Hearing? N  Vision? N  Difficulty concentrating or  making decisions? N  Walking or climbing stairs? N  Dressing or bathing? N  Doing errands, shopping? N  Preparing Food and eating ? N  Using the Toilet? N  In the past six months, have you accidently leaked urine? N  Do you have problems with loss of bowel control? N  Managing your Medications? N  Managing your Finances? N  Housekeeping or managing your Housekeeping? N  Some recent data might be hidden    Patient Care Team: Crecencio Mc, MD as PCP - General (Internal Medicine)    Assessment:    This is a routine wellness examination for Tina Silva The goal of the wellness visit is to assist the patient how to close the gaps in care and create a  preventative care plan for the patient.   Taking calcium VIT D as appropriate/Osteoporosis reviewed.  Medications reviewed; taking without issues or barriers.  Safety issues reviewed; smoke detectors in the home. No firearms in the home. Wears seatbelts when driving or riding with others. Patient does wear sunscreen or protective clothing when in direct sunlight. No violence in the home.  Patient is alert, normal appearance, oriented to person/place/and time. Correctly identified the president of the [place], recall of 3/3 objects, and performing simple calculations.  Patient displays appropriate judgement and can read correct time from watch face.  No new identified risk were noted.  No failures at ADL's or IADL's.   BMI- discussed the importance of a healthy diet, water intake and exercise. Educational material provided. Congratulated her on maintaining 20lb weight loss.    Diet: Breakfast: Egg beaters, grits, 2 pieces wheat toast with peanut butter Lunch: Meat, 2 vegetables Dinner: Hamburger, french fries Daily fluid intake: 1 cup of juice, 6-8 cups of water Encouraged low carb diet, educational material provided  HTN- followed by PCP.  Dental- every six months.  Eye- Visual acuity not assessed per patient preference since they have regular follow up with the ophthalmologist.  Wears corrective lenses.  Sleep patterns- Sleeps 7-8 hours at night.  Wakes feeling rested.  Health maintenance gaps- closed.  Patient Concerns: None at this time. Follow up with PCP as needed.  Exercise Activities and Dietary recommendations Current Exercise Habits: Home exercise routine, Type of exercise: walking, Frequency (Times/Week): 5, Intensity: Moderate  Goals    . Healthy Lifestyle          Maintain walking regimen Stay hydrated, drink plenty of fluids Low carb foods and lean meats        Fall Risk Fall Risk  03/26/2016 04/07/2015 03/14/2015 10/12/2013 06/30/2012  Falls in the past  year? No No No No No   Depression Screen PHQ 2/9 Scores 03/26/2016 04/07/2015 03/14/2015 10/12/2013  PHQ - 2 Score 0 0 0 0     Cognitive Function MMSE - Mini Mental State Exam 03/26/2016 03/14/2015  Orientation to time 5 5  Orientation to Place 5 5  Registration 3 3  Attention/ Calculation 5 5  Recall 3 3  Language- name 2 objects 2 2  Language- repeat 1 1  Language- follow 3 step command 3 3  Language- read & follow direction 1 1  Write a sentence 1 1  Copy design 1 1  Total score 30 30        Immunization History  Administered Date(s) Administered  . Influenza Whole 10/22/2011  . Influenza-Unspecified 11/05/2012, 10/11/2013, 10/06/2014, 10/15/2015  . Pneumococcal Conjugate-13 10/12/2013  . Pneumococcal Polysaccharide-23 07/15/2012  . Tdap 12/30/2011  . Zoster 07/22/2011  Screening Tests Health Maintenance  Topic Date Due  . TETANUS/TDAP  12/29/2021  . INFLUENZA VACCINE  Completed  . DEXA SCAN  Completed  . PNA vac Low Risk Adult  Completed      Plan:    End of life planning; Advance aging; Advanced directives discussed. Copy of current HCPOA/Living Will and Mental Health Advanced Directive on file.    Medicare Attestation I have personally reviewed: The patient's medical and social history Their use of alcohol, tobacco or illicit drugs Their current medications and supplements The patient's functional ability including ADLs,fall risks, home safety risks, cognitive, and hearing and visual impairment Diet and physical activities Evidence for depression   The patient's weight, height, BMI, and visual acuity have been recorded in the chart.  I have made referrals and provided education to the patient based on review of the above and I have provided the patient with a written personalized care plan for preventive services.    During the course of the visit the patient was educated and counseled about the following appropriate screening and preventive services:    Vaccines to include Pneumoccal, Influenza, Hepatitis B, Td, Zostavax, HCV  Electrocardiogram  Cardiovascular Disease  Colorectal cancer screening  Bone density screening  Diabetes screening  Glaucoma screening  Mammography/PAP  Nutrition counseling   Patient Instructions (the written plan) was given to the patient.   Varney Biles, LPN  [id number]

## 2016-03-28 NOTE — Telephone Encounter (Signed)
error 

## 2016-04-04 NOTE — Telephone Encounter (Signed)
Mailed unread message to patient.  

## 2016-04-07 NOTE — Progress Notes (Signed)
I have reviewed the above note and agree.  Ronnita Paz, M.D.  

## 2016-04-15 ENCOUNTER — Other Ambulatory Visit: Payer: Self-pay | Admitting: Internal Medicine

## 2016-05-06 ENCOUNTER — Other Ambulatory Visit: Payer: Self-pay | Admitting: Internal Medicine

## 2016-05-07 ENCOUNTER — Telehealth: Payer: Self-pay | Admitting: Internal Medicine

## 2016-05-07 DIAGNOSIS — E559 Vitamin D deficiency, unspecified: Secondary | ICD-10-CM

## 2016-05-07 DIAGNOSIS — E785 Hyperlipidemia, unspecified: Secondary | ICD-10-CM

## 2016-05-07 DIAGNOSIS — R739 Hyperglycemia, unspecified: Secondary | ICD-10-CM

## 2016-05-07 DIAGNOSIS — R5383 Other fatigue: Secondary | ICD-10-CM

## 2016-05-07 DIAGNOSIS — Z1239 Encounter for other screening for malignant neoplasm of breast: Secondary | ICD-10-CM

## 2016-05-07 NOTE — Telephone Encounter (Signed)
Pt would like to get her labs done before her appt. Pt needs referral to get her mammo too. Orders needed please and thank you!  Call pt @ [phone].

## 2016-05-07 NOTE — Telephone Encounter (Signed)
Labs and mammogram have been ordered. Spoke with pt and scheduled an appt for 06/20/2016 @ 8:45am. Pt is aware that she needs to be fasting for the lab appt.

## 2016-05-30 ENCOUNTER — Ambulatory Visit
Admission: RE | Admit: 2016-05-30 | Discharge: 2016-05-30 | Disposition: A | Discharge status: Home / Self Care | Payer: Medicare Other | Source: Ambulatory Visit | Attending: Internal Medicine | Admitting: Internal Medicine

## 2016-05-30 DIAGNOSIS — Z1231 Encounter for screening mammogram for malignant neoplasm of breast: Secondary | ICD-10-CM | POA: Diagnosis not present

## 2016-05-30 DIAGNOSIS — Z1239 Encounter for other screening for malignant neoplasm of breast: Secondary | ICD-10-CM

## 2016-06-10 ENCOUNTER — Telehealth: Payer: Self-pay | Admitting: Internal Medicine

## 2016-06-10 NOTE — Telephone Encounter (Signed)
She should keep the appointment with her podiatrist ;  She does not need to see me for that

## 2016-06-10 NOTE — Telephone Encounter (Signed)
Pt called and states that she thinks plantar fascitis. She has an appointment with Dr. Jens Som for 5/24. She wants to know if she should keep her appt with him or would Dr. Derrel Nip want to see her. Please advise, thank you!  Call pt @ [phone]

## 2016-06-10 NOTE — Telephone Encounter (Signed)
Please advise 

## 2016-06-11 NOTE — Telephone Encounter (Signed)
Patient advised of below and verbalized understanding.  

## 2016-06-20 ENCOUNTER — Other Ambulatory Visit (INDEPENDENT_AMBULATORY_CARE_PROVIDER_SITE_OTHER): Payer: Medicare Other

## 2016-06-20 DIAGNOSIS — R5383 Other fatigue: Secondary | ICD-10-CM

## 2016-06-20 DIAGNOSIS — E785 Hyperlipidemia, unspecified: Secondary | ICD-10-CM

## 2016-06-20 DIAGNOSIS — R739 Hyperglycemia, unspecified: Secondary | ICD-10-CM

## 2016-06-20 DIAGNOSIS — M79672 Pain in left foot: Secondary | ICD-10-CM | POA: Diagnosis not present

## 2016-06-20 DIAGNOSIS — M722 Plantar fascial fibromatosis: Secondary | ICD-10-CM | POA: Diagnosis not present

## 2016-06-20 DIAGNOSIS — E559 Vitamin D deficiency, unspecified: Secondary | ICD-10-CM

## 2016-06-20 DIAGNOSIS — M7732 Calcaneal spur, left foot: Secondary | ICD-10-CM | POA: Diagnosis not present

## 2016-06-20 LAB — COMPREHENSIVE METABOLIC PANEL
ALBUMIN: 4.2 g/dL (ref 3.5–5.2)
ALT: 18 U/L (ref 0–35)
AST: 20 U/L (ref 0–37)
Alkaline Phosphatase: 54 U/L (ref 39–117)
BILIRUBIN TOTAL: 0.8 mg/dL (ref 0.2–1.2)
BUN: 25 mg/dL — AB (ref 6–23)
CALCIUM: 9.4 mg/dL (ref 8.4–10.5)
CO2: 31 mEq/L (ref 19–32)
CREATININE: 1.09 mg/dL (ref 0.40–1.20)
Chloride: 104 mEq/L (ref 96–112)
GFR: 51.4 mL/min — ABNORMAL LOW (ref >60.00)
Glucose, Bld: 98 mg/dL (ref 70–99)
Potassium: 4.4 mEq/L (ref 3.5–5.1)
SODIUM: 140 meq/L (ref 135–145)
TOTAL PROTEIN: 6.6 g/dL (ref 6.0–8.3)

## 2016-06-20 LAB — CBC WITH DIFFERENTIAL/PLATELET
BASOS ABS: 0 10*3/uL (ref 0.0–0.1)
BASOS PCT: 0.4 % (ref 0.0–3.0)
EOS ABS: 0 10*3/uL (ref 0.0–0.7)
EOS PCT: 0.8 % (ref 0.0–5.0)
HEMATOCRIT: 41.6 % (ref 36.0–46.0)
HEMOGLOBIN: 14.1 g/dL (ref 12.0–15.0)
Lymphocytes Relative: 21 % (ref 12.0–46.0)
Lymphs Abs: 1.1 10*3/uL (ref 0.7–4.0)
MCHC: 33.9 g/dL (ref 30.0–36.0)
MCV: 95.7 fl (ref 78.0–100.0)
MONO ABS: 0.3 10*3/uL (ref 0.1–1.0)
MONOS PCT: 5.6 % (ref 3.0–12.0)
Neutro Abs: 3.8 10*3/uL (ref 1.4–7.7)
Neutrophils Relative %: 72.2 % (ref 43.0–77.0)
Platelets: 144 10*3/uL — ABNORMAL LOW (ref 150.0–400.0)
RBC: 4.35 Mil/uL (ref 3.87–5.11)
RDW: 12.5 % (ref 11.5–15.5)
WBC: 5.2 10*3/uL (ref 4.0–10.5)

## 2016-06-20 LAB — TSH: TSH: 3.12 u[IU]/mL (ref 0.35–4.50)

## 2016-06-20 LAB — LIPID PANEL
CHOLESTEROL: 179 mg/dL (ref 0–200)
HDL: 47.1 mg/dL (ref >39.00)
LDL Cholesterol: 109 mg/dL — ABNORMAL HIGH (ref 0–99)
NonHDL: 131.56
Total CHOL/HDL Ratio: 4
Triglycerides: 115 mg/dL (ref 0.0–149.0)
VLDL: 23 mg/dL (ref 0.0–40.0)

## 2016-06-20 LAB — VITAMIN D 25 HYDROXY (VIT D DEFICIENCY, FRACTURES): VITD: 37.37 ng/mL (ref 30.00–100.00)

## 2016-06-20 NOTE — Addendum Note (Signed)
Addended by: Leeanne Rio on: 06/20/2016 08:37 AM   Modules accepted: Orders

## 2016-06-24 ENCOUNTER — Encounter: Payer: Self-pay | Admitting: Internal Medicine

## 2016-06-27 ENCOUNTER — Ambulatory Visit (INDEPENDENT_AMBULATORY_CARE_PROVIDER_SITE_OTHER): Payer: Medicare Other | Admitting: Internal Medicine

## 2016-06-27 ENCOUNTER — Encounter: Payer: Self-pay | Admitting: Internal Medicine

## 2016-06-27 VITALS — BP 130/72 | HR 59 | Temp 98.2°F | Resp 15 | Ht 64.0 in | Wt 142.4 lb

## 2016-06-27 DIAGNOSIS — L989 Disorder of the skin and subcutaneous tissue, unspecified: Secondary | ICD-10-CM | POA: Diagnosis not present

## 2016-06-27 DIAGNOSIS — R739 Hyperglycemia, unspecified: Secondary | ICD-10-CM

## 2016-06-27 DIAGNOSIS — E785 Hyperlipidemia, unspecified: Secondary | ICD-10-CM

## 2016-06-27 DIAGNOSIS — M810 Age-related osteoporosis without current pathological fracture: Secondary | ICD-10-CM

## 2016-06-27 DIAGNOSIS — I10 Essential (primary) hypertension: Secondary | ICD-10-CM | POA: Diagnosis not present

## 2016-06-27 DIAGNOSIS — Z800 Family history of malignant neoplasm of digestive organs: Secondary | ICD-10-CM | POA: Diagnosis not present

## 2016-06-27 DIAGNOSIS — Z9889 Other specified postprocedural states: Secondary | ICD-10-CM

## 2016-06-27 DIAGNOSIS — K5730 Diverticulosis of large intestine without perforation or abscess without bleeding: Secondary | ICD-10-CM

## 2016-06-27 NOTE — Patient Instructions (Signed)
I have made the following referrals:  Dr Kellie Moor to look at the place on your nose  [facility] for dietary ways to reduce cholesterol    Health Maintenance for Postmenopausal Women Menopause is a normal process in which your reproductive ability comes to an end. This process happens gradually over a span of months to years, usually between the ages of 90 and 11. Menopause is complete when you have missed 12 consecutive menstrual periods. It is important to talk with your health care provider about some of the most common conditions that affect postmenopausal women, such as heart disease, cancer, and bone loss (osteoporosis). Adopting a healthy lifestyle and getting preventive care can help to promote your health and wellness. Those actions can also lower your chances of developing some of these common conditions. What should I know about menopause? During menopause, you may experience a number of symptoms, such as:  Moderate-to-severe hot flashes.  Night sweats.  Decrease in sex drive.  Mood swings.  Headaches.  Tiredness.  Irritability.  Memory problems.  Insomnia.  Choosing to treat or not to treat menopausal changes is an individual decision that you make with your health care provider. What should I know about hormone replacement therapy and supplements? Hormone therapy products are effective for treating symptoms that are associated with menopause, such as hot flashes and night sweats. Hormone replacement carries certain risks, especially as you become older. If you are thinking about using estrogen or estrogen with progestin treatments, discuss the benefits and risks with your health care provider. What should I know about heart disease and stroke? Heart disease, heart attack, and stroke become more likely as you age. This may be due, in part, to the hormonal changes that your body experiences during menopause. These can affect how your body processes  dietary fats, triglycerides, and cholesterol. Heart attack and stroke are both medical emergencies. There are many things that you can do to help prevent heart disease and stroke:  Have your blood pressure checked at least every 1-2 years. High blood pressure causes heart disease and increases the risk of stroke.  If you are 22-2 years old, ask your health care provider if you should take aspirin to prevent a heart attack or a stroke.  Do not use any tobacco products, including cigarettes, chewing tobacco, or electronic cigarettes. If you need help quitting, ask your health care provider.  It is important to eat a healthy diet and maintain a healthy weight. ? Be sure to include plenty of vegetables, fruits, low-fat dairy products, and lean protein. ? Avoid eating foods that are high in solid fats, added sugars, or salt (sodium).  Get regular exercise. This is one of the most important things that you can do for your health. ? Try to exercise for at least 150 minutes each week. The type of exercise that you do should increase your heart rate and make you sweat. This is known as moderate-intensity exercise. ? Try to do strengthening exercises at least twice each week. Do these in addition to the moderate-intensity exercise.  Know your numbers.Ask your health care provider to check your cholesterol and your blood glucose. Continue to have your blood tested as directed by your health care provider.  What should I know about cancer screening? There are several types of cancer. Take the following steps to reduce your risk and to catch any cancer development as early as possible. Breast Cancer  Practice breast self-awareness. ? This means understanding how your  breasts normally appear and feel. ? It also means doing regular breast self-exams. Let your health care provider know about any changes, no matter how small.  If you are 40 or older, have a clinician do a breast exam (clinical breast  exam or CBE) every year. Depending on your age, family history, and medical history, it may be recommended that you also have a yearly breast X-ray (mammogram).  If you have a family history of breast cancer, talk with your health care provider about genetic screening.  If you are at high risk for breast cancer, talk with your health care provider about having an MRI and a mammogram every year.  Breast cancer (BRCA) gene test is recommended for women who have family members with BRCA-related cancers. Results of the assessment will determine the need for genetic counseling and BRCA1 and for BRCA2 testing. BRCA-related cancers include these types: ? Breast. This occurs in males or females. ? Ovarian. ? Tubal. This may also be called fallopian tube cancer. ? Cancer of the abdominal or pelvic lining (peritoneal cancer). ? Prostate. ? Pancreatic.  Cervical, Uterine, and Ovarian Cancer Your health care provider may recommend that you be screened regularly for cancer of the pelvic organs. These include your ovaries, uterus, and vagina. This screening involves a pelvic exam, which includes checking for microscopic changes to the surface of your cervix (Pap test).  For women ages 21-65, health care providers may recommend a pelvic exam and a Pap test every three years. For women ages 68-65, they may recommend the Pap test and pelvic exam, combined with testing for human papilloma virus (HPV), every five years. Some types of HPV increase your risk of cervical cancer. Testing for HPV may also be done on women of any age who have unclear Pap test results.  Other health care providers may not recommend any screening for nonpregnant women who are considered low risk for pelvic cancer and have no symptoms. Ask your health care provider if a screening pelvic exam is right for you.  If you have had past treatment for cervical cancer or a condition that could lead to cancer, you need Pap tests and screening for  cancer for at least 20 years after your treatment. If Pap tests have been discontinued for you, your risk factors (such as having a new sexual partner) need to be reassessed to determine if you should start having screenings again. Some women have medical problems that increase the chance of getting cervical cancer. In these cases, your health care provider may recommend that you have screening and Pap tests more often.  If you have a family history of uterine cancer or ovarian cancer, talk with your health care provider about genetic screening.  If you have vaginal bleeding after reaching menopause, tell your health care provider.  There are currently no reliable tests available to screen for ovarian cancer.  Lung Cancer Lung cancer screening is recommended for adults 33-54 years old who are at high risk for lung cancer because of a history of smoking. A yearly low-dose CT scan of the lungs is recommended if you:  Currently smoke.  Have a history of at least 30 pack-years of smoking and you currently smoke or have quit within the past 15 years. A pack-year is smoking an average of one pack of cigarettes per day for one year.  Yearly screening should:  Continue until it has been 15 years since you quit.  Stop if you develop a health problem that would  prevent you from having lung cancer treatment.  Colorectal Cancer  This type of cancer can be detected and can often be prevented.  Routine colorectal cancer screening usually begins at age 15 and continues through age 34.  If you have risk factors for colon cancer, your health care provider may recommend that you be screened at an earlier age.  If you have a family history of colorectal cancer, talk with your health care provider about genetic screening.  Your health care provider may also recommend using home test kits to check for hidden blood in your stool.  A small camera at the end of a tube can be used to examine your colon  directly (sigmoidoscopy or colonoscopy). This is done to check for the earliest forms of colorectal cancer.  Direct examination of the colon should be repeated every 5-10 years until age 38. However, if early forms of precancerous polyps or small growths are found or if you have a family history or genetic risk for colorectal cancer, you may need to be screened more often.  Skin Cancer  Check your skin from head to toe regularly.  Monitor any moles. Be sure to tell your health care provider: ? About any new moles or changes in moles, especially if there is a change in a mole's shape or color. ? If you have a mole that is larger than the size of a pencil eraser.  If any of your family members has a history of skin cancer, especially at a young age, talk with your health care provider about genetic screening.  Always use sunscreen. Apply sunscreen liberally and repeatedly throughout the day.  Whenever you are outside, protect yourself by wearing long sleeves, pants, a wide-brimmed hat, and sunglasses.  What should I know about osteoporosis? Osteoporosis is a condition in which bone destruction happens more quickly than new bone creation. After menopause, you may be at an increased risk for osteoporosis. To help prevent osteoporosis or the bone fractures that can happen because of osteoporosis, the following is recommended:  If you are 41-33 years old, get at least 1,000 mg of calcium and at least 600 mg of vitamin D per day.  If you are older than age 34 but younger than age 55, get at least 1,200 mg of calcium and at least 600 mg of vitamin D per day.  If you are older than age 62, get at least 1,200 mg of calcium and at least 800 mg of vitamin D per day.  Smoking and excessive alcohol intake increase the risk of osteoporosis. Eat foods that are rich in calcium and vitamin D, and do weight-bearing exercises several times each week as directed by your health care provider. What should I  know about how menopause affects my mental health? Depression may occur at any age, but it is more common as you become older. Common symptoms of depression include:  Low or sad mood.  Changes in sleep patterns.  Changes in appetite or eating patterns.  Feeling an overall lack of motivation or enjoyment of activities that you previously enjoyed.  Frequent crying spells.  Talk with your health care provider if you think that you are experiencing depression. What should I know about immunizations? It is important that you get and maintain your immunizations. These include:  Tetanus, diphtheria, and pertussis (Tdap) booster vaccine.  Influenza every year before the flu season begins.  Pneumonia vaccine.  Shingles vaccine.  Your health care provider may also recommend other immunizations. This  information is not intended to replace advice given to you by your health care provider. Make sure you discuss any questions you have with your health care provider. Document Released: 03/08/2005 Document Revised: 08/04/2015 Document Reviewed: 10/18/2014 Elsevier Interactive Patient Education  2018 [employer].

## 2016-06-27 NOTE — Progress Notes (Signed)
Patient ID: Tina Silva, female    DOB: [birth year]  Age: 82 y.o. MRN: [id number]  The patient is here for   follow up on multiple  chronic and acute problems.  Lipids reviewed.  10 yr risk is 16% .  Does not want statin therapy,  Has started changing her diet ,  More salads,  No candy or ice cream   Has ALREADY LOST 5 lbs in one week   Plantar fasciitis left heel, injected by Dr Tina Silva.  FH of colon CA,  Had colonoscopy Jan 2018, no polyps,  aging out of screening discussed.     The risk factors are reflected in the social history.  The roster of all physicians providing medical care to patient - is listed in the Snapshot section of the chart.  Activities of daily living:  The patient is 100% independent in all ADLs: dressing, toileting, feeding as well as independent mobility  Home safety : The patient has smoke detectors in the home. They wear seatbelts.  There are no firearms at home. There is no violence in the home.   There is no risks for hepatitis, STDs or HIV. There is no   history of blood transfusion. They have no travel history to infectious disease endemic areas of the world.  The patient has seen their dentist in the last six month. They have seen their eye doctor in the last year. They admit to slight hearing difficulty with regard to whispered voices and some television programs.  They have deferred audiologic testing in the last year.  They do not  have excessive sun exposure. Discussed the need for sun protection: hats, long sleeves and use of sunscreen if there is significant sun exposure.   Diet: the importance of a healthy diet is discussed. They do have a healthy diet.  The benefits of regular aerobic exercise were discussed. She walks 4 times per week ,  20 minutes.   Depression screen: there are no signs or vegative symptoms of depression- irritability, change in appetite, anhedonia, sadness/tearfullness.  Cognitive assessment: the patient manages all their  financial and personal affairs and is actively engaged. They could relate day,date,year and events; recalled 2/3 objects at 3 minutes; performed clock-face test normally.  The following portions of the patient's history were reviewed and updated as appropriate: allergies, current medications, past family history, past medical history,  past surgical history, past social history  and problem list.  Visual acuity was not assessed per patient preference since she has regular follow up with her ophthalmologist. Hearing and body mass index were assessed and reviewed.   During the course of the visit the patient was educated and counseled about appropriate screening and preventive services including : fall prevention , diabetes screening, nutrition counseling, colorectal cancer screening, and recommended immunizations.    CC: The primary encounter diagnosis was Non-healing skin lesion of nose. Diagnoses of Hyperlipidemia with target LDL less than 100, Diverticulosis of large intestine without hemorrhage, Family history of colon cancer requiring screening colonoscopy, Hyperglycemia, History of cardiac catheterization, Essential hypertension, and Osteoporosis, post-menopausal were also pertinent to this visit.  History Tina Silva has a past medical history of Hypertension.   She has a past surgical history that includes Abdominal hysterectomy; Appendectomy (1974); Tonsillectomy and adenoidectomy; Eye surgery (june 2013); Tonsillectomy; Colonoscopy with propofol (N/A, 02/12/2016); and Breast biopsy (Left, 11/04/2005).   Her family history includes Cancer in her maternal grandmother and sister; Diabetes in her paternal aunt and sister; Heart disease in her  father; Parkinson's disease in her mother.She reports that she has never smoked. She has never used smokeless tobacco. She reports that she does not drink alcohol or use drugs.  Outpatient Medications Prior to Visit  Medication Sig Dispense Refill  . aspirin 81  MG tablet Take 81 mg by mouth daily.    . calcium-vitamin D (OSCAL WITH D) 500-200 MG-UNIT tablet Take 1 tablet by mouth.     . Cholecalciferol (VITAMIN D3) 1000 units CAPS Take 1 capsule by mouth.    . fluticasone (FLONASE) 50 MCG/ACT nasal spray Place 2 sprays into both nostrils daily. 16 g 6  . metoprolol tartrate (LOPRESSOR) 25 MG tablet TAKE 1 TABLET BY MOUTH TWICE DAILY. 60 tablet 3  . Multiple Vitamin (MULTIVITAMIN) tablet Take 1 tablet by mouth daily.    . risedronate (ACTONEL) 35 MG tablet TAKE ONE TABLET WEEKLY SAME DAY OF EACH WEEK--TAKE FIRST THING ON ARISING WITH A FULL GLASS OF WATER-STAY UP 30 MINUTES 12 tablet 2  . triamterene-hydrochlorothiazide (MAXZIDE-25) 37.5-25 MG tablet TAKE 1/2 TABLET BY MOUTH ONCE DAILY AS DIRECTED BY PHYSICIAN 30 tablet 3  . omeprazole (PRILOSEC) 20 MG capsule Take 1 capsule (20 mg total) by mouth daily. (Patient not taking: Reported on 06/27/2016) 90 capsule 3   No facility-administered medications prior to visit.     Review of Systems   Patient denies headache, fevers, malaise, unintentional weight loss, skin rash, eye pain, sinus congestion and sinus pain, sore throat, dysphagia,  hemoptysis , cough, dyspnea, wheezing, chest pain, palpitations, orthopnea, edema, abdominal pain, nausea, melena, diarrhea, constipation, flank pain, dysuria, hematuria, urinary  Frequency, nocturia, numbness, tingling, seizures,  Focal weakness, Loss of consciousness,  Tremor, insomnia, depression, anxiety, and suicidal ideation.      Objective:  BP 130/72 (BP Location: Left Arm, Patient Position: Sitting, Cuff Size: Normal)   Pulse (!) 59   Temp 98.2 F (36.8 C) (Oral)   Resp 15   Ht 5\' 4"  (1.626 m)   Wt 142 lb 6.4 oz (64.6 kg)   SpO2 97%   BMI 24.44 kg/m   Physical Exam   General appearance: alert, cooperative and appears stated age Head: Normocephalic, without obvious abnormality, atraumatic Eyes: conjunctivae/corneas clear. PERRL, EOM's intact. Fundi  benign. Ears: normal TM's and external ear canals both ears Nose: Nares normal. Septum midline. Mucosa normal. No drainage or sinus tenderness. Throat: lips, mucosa, and tongue normal; teeth and gums normal Neck: no adenopathy, no carotid bruit, no JVD, supple, symmetrical, trachea midline and thyroid not enlarged, symmetric, no tenderness/mass/nodules Lungs: clear to auscultation bilaterally Breasts: normal appearance, no masses or tenderness Heart: regular rate and rhythm, S1, S2 normal, no murmur, click, rub or gallop Abdomen: soft, non-tender; bowel sounds normal; no masses,  no organomegaly Extremities: extremities normal, atraumatic, no cyanosis or edema Pulses: 2+ and symmetric Skin: Skin color, texture, turgor normal. No rashes or lesions Neurologic: Alert and oriented X 3, normal strength and tone. Normal symmetric reflexes. Normal coordination and gait.      Assessment & Plan:   Problem List Items Addressed This Visit    Osteoporosis, post-menopausal    treated with alendronate  Followed by change to risedronate.  History of  remote wrist fracture.         Hypertension    Well controlled on current regimen. Renal function stable, no changes today.  Lab Results  Component Value Date   CREATININE 1.09 06/20/2016   Lab Results  Component Value Date   NA 140 06/20/2016  K 4.4 06/20/2016   CL 104 06/20/2016   CO2 31 06/20/2016         Hyperlipidemia with target LDL less than 100     10 yr risk is 16% by [facility],  She is declining therapy for primary prevention.   Lab Results  Component Value Date   CHOL 179 06/20/2016   HDL 47.10 06/20/2016   LDLCALC 109 (H) 06/20/2016   TRIG 115.0 06/20/2016   CHOLHDL 4 06/20/2016   Lab Results  Component Value Date   ALT 18 06/20/2016   AST 20 06/20/2016   ALKPHOS 54 06/20/2016   BILITOT 0.8 06/20/2016             Relevant Orders   Amb Referral to Nutrition and Diabetic E   Hyperglycemia    She has no signs  of DM by follow up labs and prior a1c was < 6.0. Fasting sugars remain < 100   Lab Results  Component Value Date   HGBA1C 5.6 01/10/2012           History of cardiac catheterization    Remote, normal, with no history of diabetes.      Family history of colon cancer requiring screening colonoscopy    She had a normal colonoscopy at age 32.         Other Visit Diagnoses    Non-healing skin lesion of nose    -  Primary   Relevant Orders   Ambulatory referral to Dermatology   Diverticulosis of large intestine without hemorrhage       Relevant Orders   Amb Referral to Nutrition and Diabetic E    A total of 25 minutes of face to face time was spent with patient more than half of which was spent in counselling and coordination of care    I have discontinued Ms. Schneck's omeprazole. I am also having her maintain her multivitamin, aspirin, calcium-vitamin D, Vitamin D3, fluticasone, risedronate, triamterene-hydrochlorothiazide, and metoprolol tartrate.  No orders of the defined types were placed in this encounter.   Medications Discontinued During This Encounter  Medication Reason  . omeprazole (PRILOSEC) 20 MG capsule Patient has not taken in last 30 days    Follow-up: No Follow-up on file.   Crecencio Mc, MD

## 2016-06-29 NOTE — Assessment & Plan Note (Signed)
She has no signs of DM by follow up labs and prior a1c was < 6.0. Fasting sugars remain < 100   Lab Results  Component Value Date   HGBA1C 5.6 01/10/2012

## 2016-06-29 NOTE — Assessment & Plan Note (Signed)
Remote, normal, with no history of diabetes.

## 2016-06-29 NOTE — Assessment & Plan Note (Signed)
Well controlled on current regimen. Renal function stable, no changes today.  Lab Results  Component Value Date   CREATININE 1.09 06/20/2016   Lab Results  Component Value Date   NA 140 06/20/2016   K 4.4 06/20/2016   CL 104 06/20/2016   CO2 31 06/20/2016

## 2016-06-29 NOTE — Assessment & Plan Note (Signed)
10 yr risk is 16% by [facility],  She is declining therapy for primary prevention.   Lab Results  Component Value Date   CHOL 179 06/20/2016   HDL 47.10 06/20/2016   LDLCALC 109 (H) 06/20/2016   TRIG 115.0 06/20/2016   CHOLHDL 4 06/20/2016   Lab Results  Component Value Date   ALT 18 06/20/2016   AST 20 06/20/2016   ALKPHOS 54 06/20/2016   BILITOT 0.8 06/20/2016

## 2016-06-29 NOTE — Assessment & Plan Note (Signed)
She had a normal colonoscopy at age 82.

## 2016-06-29 NOTE — Assessment & Plan Note (Signed)
treated with alendronate  Followed by change to risedronate.  History of  remote wrist fracture.

## 2016-07-03 ENCOUNTER — Encounter: Payer: Self-pay | Admitting: Dietician

## 2016-07-03 ENCOUNTER — Encounter: Payer: Medicare Other | Attending: Internal Medicine | Admitting: Dietician

## 2016-07-03 VITALS — Ht 64.0 in | Wt 141.9 lb

## 2016-07-03 DIAGNOSIS — E785 Hyperlipidemia, unspecified: Secondary | ICD-10-CM | POA: Diagnosis not present

## 2016-07-03 DIAGNOSIS — K5730 Diverticulosis of large intestine without perforation or abscess without bleeding: Secondary | ICD-10-CM | POA: Insufficient documentation

## 2016-07-03 DIAGNOSIS — Z713 Dietary counseling and surveillance: Secondary | ICD-10-CM | POA: Insufficient documentation

## 2016-07-03 NOTE — Progress Notes (Signed)
Medical Nutrition Therapy: Visit start time: 8299  end time: 1630  Assessment:  Diagnosis: hyperlipidemia Past medical history: HTN, osteoporosis, acid reflux, hyperglycemia, see chart Psychosocial issues/ stress concerns: none Preferred learning method:  . Visual  Current weight: 141.9lb  Height: 5'4" Medications, supplements: aspirin, MVI, calcium + Vit D3, lopressor, see chart for full list  Progress and evaluation: patient is seeking education on how to lower cholesterol via dietary intervention. States goal wt is 139-140# and is currently 1-2# from goal wt. Reports h/o wt loss using [employer] program starting in 2014. Highest wt 165# which went down to 145# in approx 1 years time. Did not eat much bread during [employer] program but now eats sandwiches frequently. Wt in February 2018 146# and 142# in May 2018. Main dietary changes as of late include cutting down on the number of burgers and french fries consumed and opting for salads with grilled chicken instead or making home made burgers with 93/7 ground beef. Barriers include hating to cook, eating mostly canned vegetables (not low sodium), not eating fruit, lack of knowledge on how to plan a meal, eating out frequently. Does not eat nuts, seeds or popcorn d/t h/o diverticulitis.    Physical activity: walking at work 3x/wk (1-2 miles) and yard work at home I.E. Mowing the grass   Dietary Intake:  Usual eating pattern includes 3 meals and 2 snacks per day. Dining out frequency: 5-6 meals per week.  Breakfast: 2 slices whole wheat toast, 1-2 egg beaters, peanut butter sometimes, raisin bran with peaches, [place] hambiscuit which she's since cut out Snack: bite sized candy, PB crackers --- now will have graham crackers or jello or mini pretzels Lunch: deli [place] 2oz on toasted bread with lettuce and light mayo + ginger snaps Snack: same as prior Supper: 2 thin pieces of chicken breast + egg beater + corn flakes, beans,  beets. [employer] roast beef Snack: not usually. Sometimes 1-2 Tbs orange sherbert Beverages: water, OJ (4oz), skim milk, unsweetened tea, cutting out cola d/t caffeine content  Nutrition Care Education: Topics covered: dietary sources of different types of fats, how to make smart choices when eating out and when deciding what to cook at home, building a balanced meal, quick & healthy meal and snack ideas, re-incorporating fruit into the diet, red meat vs white meat fat content, packaged vs fresh foods Basic nutrition: basic food groups, appropriate nutrient balance, appropriate meal and snack schedule, general nutrition guidelines    Weight control: benefits of weight control, identifying healthy weight, determining reasonable weight goal, behavioral changes for weight loss Advanced nutrition: cooking techniques, dining out, food label reading Hypertension:  identifying high sodium foods Hyperlipidemia:  target goals for lipids, healthy and unhealthy fats, role of fiber Other lifestyle changes:  benefits of making changes, increasing motivation, readiness for change, identifying habits that need to change   Nutritional Diagnosis:  NI-5.6.3 Inappropriate intake of fats (specify): saturated and trans fat As related to dietary habits.  As evidenced by dx of hyperlipedemia and patient report of h/o diet high in saturated/ trans fats.  Intervention: Discussion as noted above. Goals were established and provided to patient along with nutrition education materials. Patient would like to see if her insurance will cover the appointments before scheduling a follow up session.  Education Materials given:  Marland Kitchen [employer] and [employer] handout . High Cholesterol Nutrition Therapy packet . Goals/ instructions  Learner/ who was taught:  . Patient  Level of understanding: Marland Kitchen Verbalizes/ demonstrates competency  Demonstrated degree of understanding via:   Teach back Learning barriers: . None  Willingness to  learn/ readiness for change: . Acceptance, ready for change  Monitoring and Evaluation:  Dietary intake, exercise, lipid labs, and body weight      follow up: prn

## 2016-07-03 NOTE — Patient Instructions (Addendum)
-   Practice planning a balanced meal that includes carbohydrates, a protein, and vegetables. Add fruits and dairy products as you can throughout the day  - Try buying low sodium condiments and canned vegetables  - Review the handout on Cholesterol and practice choosing lower fat foods, particularly those that are high in UNsaturated fats, along with adding more fiber to your diet. Choose low fat or reduced fat dairy products, dressings and condiments.   - Weight loss goal: 139-140#  - Continue to remain active- great job!

## 2016-07-16 DIAGNOSIS — M722 Plantar fascial fibromatosis: Secondary | ICD-10-CM | POA: Diagnosis not present

## 2016-07-26 ENCOUNTER — Other Ambulatory Visit: Payer: Self-pay | Admitting: Internal Medicine

## 2016-07-28 ENCOUNTER — Emergency Department: Payer: Medicare Other

## 2016-07-28 ENCOUNTER — Encounter: Payer: Self-pay | Admitting: Emergency Medicine

## 2016-07-28 ENCOUNTER — Observation Stay
Admission: EM | Admit: 2016-07-28 | Discharge: 2016-07-29 | Disposition: A | Discharge status: Home / Self Care | Payer: Medicare Other | Attending: Specialist | Admitting: Specialist

## 2016-07-28 DIAGNOSIS — E785 Hyperlipidemia, unspecified: Secondary | ICD-10-CM | POA: Diagnosis not present

## 2016-07-28 DIAGNOSIS — R112 Nausea with vomiting, unspecified: Secondary | ICD-10-CM | POA: Diagnosis not present

## 2016-07-28 DIAGNOSIS — R42 Dizziness and giddiness: Principal | ICD-10-CM

## 2016-07-28 DIAGNOSIS — Z7982 Long term (current) use of aspirin: Secondary | ICD-10-CM | POA: Insufficient documentation

## 2016-07-28 DIAGNOSIS — Z881 Allergy status to other antibiotic agents status: Secondary | ICD-10-CM | POA: Insufficient documentation

## 2016-07-28 DIAGNOSIS — E039 Hypothyroidism, unspecified: Secondary | ICD-10-CM | POA: Insufficient documentation

## 2016-07-28 DIAGNOSIS — H9312 Tinnitus, left ear: Secondary | ICD-10-CM | POA: Diagnosis not present

## 2016-07-28 DIAGNOSIS — R9082 White matter disease, unspecified: Secondary | ICD-10-CM | POA: Diagnosis not present

## 2016-07-28 DIAGNOSIS — K219 Gastro-esophageal reflux disease without esophagitis: Secondary | ICD-10-CM | POA: Diagnosis not present

## 2016-07-28 DIAGNOSIS — I10 Essential (primary) hypertension: Secondary | ICD-10-CM | POA: Diagnosis not present

## 2016-07-28 DIAGNOSIS — Z79899 Other long term (current) drug therapy: Secondary | ICD-10-CM | POA: Diagnosis not present

## 2016-07-28 DIAGNOSIS — M810 Age-related osteoporosis without current pathological fracture: Secondary | ICD-10-CM | POA: Diagnosis not present

## 2016-07-28 HISTORY — DX: Hypothyroidism, unspecified: E03.9

## 2016-07-28 HISTORY — DX: Age-related osteoporosis without current pathological fracture: M81.0

## 2016-07-28 HISTORY — DX: Hyperlipidemia, unspecified: E78.5

## 2016-07-28 HISTORY — DX: Dizziness and giddiness: R42

## 2016-07-28 LAB — COMPREHENSIVE METABOLIC PANEL
ALK PHOS: 53 U/L (ref 38–126)
ALT: 17 U/L (ref 14–54)
AST: 23 U/L (ref 15–41)
Albumin: 3.9 g/dL (ref 3.5–5.0)
Anion gap: 7 (ref 5–15)
BILIRUBIN TOTAL: 0.8 mg/dL (ref 0.3–1.2)
BUN: 20 mg/dL (ref 6–20)
CALCIUM: 8.9 mg/dL (ref 8.9–10.3)
CO2: 30 mmol/L (ref 22–32)
CREATININE: 0.89 mg/dL (ref 0.44–1.00)
Chloride: 101 mmol/L (ref 101–111)
GFR calc Af Amer: >60 mL/min (ref >60)
GFR calc non Af Amer: >60 mL/min (ref >60)
GLUCOSE: 118 mg/dL — AB (ref 65–99)
Potassium: 3.7 mmol/L (ref 3.5–5.1)
SODIUM: 138 mmol/L (ref 135–145)
TOTAL PROTEIN: 6.5 g/dL (ref 6.5–8.1)

## 2016-07-28 LAB — TROPONIN I: Troponin I: <0.03 ng/mL (ref <0.03)

## 2016-07-28 LAB — CBC
HCT: 41 % (ref 35.0–47.0)
Hemoglobin: 14.2 g/dL (ref 12.0–16.0)
MCH: 32.8 pg (ref 26.0–34.0)
MCHC: 34.6 g/dL (ref 32.0–36.0)
MCV: 94.8 fL (ref 80.0–100.0)
PLATELETS: 110 10*3/uL — AB (ref 150–440)
RBC: 4.33 MIL/uL (ref 3.80–5.20)
RDW: 12.8 % (ref 11.5–14.5)
WBC: 7.3 10*3/uL (ref 3.6–11.0)

## 2016-07-28 MED ORDER — ONDANSETRON HCL 4 MG/2ML IJ SOLN
4.0000 mg | Freq: Four times a day (QID) | INTRAMUSCULAR | Status: DC | PRN
Start: 2016-07-28 — End: 2016-07-29

## 2016-07-28 MED ORDER — MECLIZINE HCL 25 MG PO TABS
25.0000 mg | ORAL_TABLET | Freq: Once | ORAL | Status: AC
  Administered 2016-07-28: 25 mg via ORAL
  Filled 2016-07-28: qty 1

## 2016-07-28 MED ORDER — DIAZEPAM 2 MG PO TABS: 2.0000 mg | ORAL_TABLET | Freq: Four times a day (QID) | ORAL | Status: DC | PRN

## 2016-07-28 MED ORDER — VITAMIN D 1000 UNITS PO TABS
1000.0000 [IU] | ORAL_TABLET | Freq: Every day | ORAL | Status: DC
  Administered 2016-07-28 – 2016-07-29 (×2): 1000 [IU] via ORAL
  Filled 2016-07-28 (×2): qty 1

## 2016-07-28 MED ORDER — METOPROLOL TARTRATE 25 MG PO TABS
25.0000 mg | ORAL_TABLET | Freq: Two times a day (BID) | ORAL | Status: DC
  Administered 2016-07-29: 25 mg via ORAL
  Filled 2016-07-28: qty 1

## 2016-07-28 MED ORDER — ACETAMINOPHEN 650 MG RE SUPP: 650.0000 mg | Freq: Four times a day (QID) | RECTAL | Status: DC | PRN

## 2016-07-28 MED ORDER — TRIAMTERENE-HCTZ 37.5-25 MG PO TABS
0.5000 | ORAL_TABLET | Freq: Every day | ORAL | Status: DC
  Administered 2016-07-28 – 2016-07-29 (×2): 0.5 via ORAL
  Filled 2016-07-28 (×2): qty 0.5

## 2016-07-28 MED ORDER — CALCIUM CARBONATE-VITAMIN D 500-200 MG-UNIT PO TABS
1.0000 | ORAL_TABLET | Freq: Every day | ORAL | Status: DC
  Administered 2016-07-28 – 2016-07-29 (×2): 1 via ORAL
  Filled 2016-07-28 (×2): qty 1

## 2016-07-28 MED ORDER — ADULT MULTIVITAMIN W/MINERALS CH
1.0000 | ORAL_TABLET | Freq: Every day | ORAL | Status: DC
  Administered 2016-07-28 – 2016-07-29 (×2): 1 via ORAL
  Filled 2016-07-28: qty 1

## 2016-07-28 MED ORDER — ONDANSETRON HCL 4 MG PO TABS: 4.0000 mg | ORAL_TABLET | Freq: Four times a day (QID) | ORAL | Status: DC | PRN

## 2016-07-28 MED ORDER — MECLIZINE HCL 12.5 MG PO TABS
12.5000 mg | ORAL_TABLET | Freq: Three times a day (TID) | ORAL | Status: DC
  Administered 2016-07-28 – 2016-07-29 (×3): 12.5 mg via ORAL
  Filled 2016-07-28 (×3): qty 1

## 2016-07-28 MED ORDER — LORAZEPAM 2 MG/ML IJ SOLN
0.5000 mg | Freq: Once | INTRAMUSCULAR | Status: AC
  Administered 2016-07-28: 0.5 mg via INTRAVENOUS
  Filled 2016-07-28: qty 1

## 2016-07-28 MED ORDER — ASPIRIN EC 81 MG PO TBEC
81.0000 mg | DELAYED_RELEASE_TABLET | Freq: Every day | ORAL | Status: DC
  Administered 2016-07-28 – 2016-07-29 (×2): 81 mg via ORAL
  Filled 2016-07-28 (×2): qty 1

## 2016-07-28 MED ORDER — SODIUM CHLORIDE 0.9 % IV SOLN
1000.0000 mL | Freq: Once | INTRAVENOUS | Status: AC
  Administered 2016-07-28: 1000 mL via INTRAVENOUS

## 2016-07-28 MED ORDER — ACETAMINOPHEN 325 MG PO TABS: 650.0000 mg | ORAL_TABLET | Freq: Four times a day (QID) | ORAL | Status: DC | PRN

## 2016-07-28 MED ORDER — ENOXAPARIN SODIUM 40 MG/0.4ML ~~LOC~~ SOLN
40.0000 mg | SUBCUTANEOUS | Status: DC
  Administered 2016-07-28: 21:00:00 40 mg via SUBCUTANEOUS
  Filled 2016-07-28: qty 0.4

## 2016-07-28 MED ORDER — SODIUM CHLORIDE 0.9 % IV SOLN
INTRAVENOUS | Status: DC
  Administered 2016-07-28 – 2016-07-29 (×2): via INTRAVENOUS

## 2016-07-28 NOTE — ED Notes (Signed)
Pt and pt visitor given warm blanket. Visitor shown where bathroom is.

## 2016-07-28 NOTE — ED Provider Notes (Signed)
[facility] Emergency Department Provider Note   ____________________________________________    I have reviewed the triage vital signs and the nursing notes.   HISTORY  Chief Complaint Dizziness; Nausea; and Emesis     HPI Tina Silva is a 82 y.o. female who presents with complaints of dizziness and nausea. Patient reports she woke up this morning and when she opened her eyes she felt like the room was spinning quite violently. She reports she opens her eyes and makes her quite nauseous and she is quite off balance when trying to walk. She has never had this before. She denies fevers chills or neck pain. No headache. No neuro deficits. She does note that she has had some left ear symptoms over the last 2 weeks. She reports it started with an echo and now she seems to have developed a more muffled hearing in the left ear and in fact she had scheduled ENT appointment tomorrow.   Past Medical History:  Diagnosis Date  . Hyperlipidemia   . Hypertension   . Hypothyroidism   . Osteoporosis     Patient Active Problem List   Diagnosis Date Noted  . Vertigo 07/28/2016  . Pharyngitis 03/21/2016  . History of cardiac catheterization 05/03/2015  . Cough 05/03/2015  . Sacroiliac pain 09/07/2014  . Family history of colon cancer requiring screening colonoscopy 09/07/2014  . Pain in joint, shoulder region 08/12/2014  . Rash and nonspecific skin eruption 07/08/2013  . Pain in joint, ankle and foot 11/25/2012  . Medicare annual wellness visit, subsequent 06/30/2012  . Right hip pain 06/30/2012  . Hemorrhoid prolapse 01/01/2012  . Hyperglycemia 01/01/2012  . Osteoporosis, post-menopausal 09/01/2011  . Acid reflux 09/01/2011  . Hypertension 08/30/2011  . Hyperlipidemia with target LDL less than 100 08/30/2011    Past Surgical History:  Procedure Laterality Date  . ABDOMINAL HYSTERECTOMY    . APPENDECTOMY  1974  . BREAST BIOPSY Left 11/04/2005   core   . COLONOSCOPY WITH PROPOFOL N/A 02/12/2016   Procedure: COLONOSCOPY WITH PROPOFOL;  Surgeon: Manya Silvas, MD;  Location: [facility];  Service: Endoscopy;  Laterality: N/A;  . EYE SURGERY  june 2013   cataract with lens,  dinglein left eye   . TONSILLECTOMY    . TONSILLECTOMY AND ADENOIDECTOMY      Prior to Admission medications   Medication Sig Start Date End Date Taking? Authorizing Provider  aspirin 81 MG tablet Take 81 mg by mouth daily.   Yes [provider]  calcium-vitamin D (OSCAL WITH D) 500-200 MG-UNIT tablet Take 1 tablet by mouth.    Yes [provider]  Cholecalciferol (VITAMIN D3) 1000 units CAPS Take 1 capsule by mouth.   Yes [provider]  metoprolol tartrate (LOPRESSOR) 25 MG tablet TAKE 1 TABLET BY MOUTH TWICE DAILY. 05/06/16  Yes Crecencio Mc, MD  Multiple Vitamin (MULTIVITAMIN) tablet Take 1 tablet by mouth daily.   Yes [provider]  risedronate (ACTONEL) 35 MG tablet TAKE 1 TABLET BY MOUTH WEEKLY SAME DAY OF EACH WEEK. TAKE FIRST THINGON ARISING WITH A FULL GLASS OF WATER. STAYUP RIGHT FOR 30 MINUTES. 07/26/16  Yes Crecencio Mc, MD  triamterene-hydrochlorothiazide (MAXZIDE-25) 37.5-25 MG tablet TAKE 1/2 TABLET BY MOUTH ONCE DAILY AS DIRECTED BY PHYSICIAN 05/06/16  Yes Crecencio Mc, MD  fluticasone (FLONASE) 50 MCG/ACT nasal spray Place 2 sprays into both nostrils daily. Patient not taking: Reported on 07/03/2016 05/24/15   Coral Spikes, DO  Allergies Ciprofloxacin; Doxycycline; Flagyl [metronidazole]; Iodine; and Sudafed [pseudoephedrine hcl]  Family History  Problem Relation Age of Onset  . Parkinson's disease Mother   . Cancer Sister        AML  . Diabetes Sister   . Heart disease Father   . Diabetes Paternal Aunt   . Cancer Maternal Grandmother   . Breast cancer Neg Hx     Social History Social History  Substance Use Topics  . Smoking status: Never Smoker  . Smokeless tobacco: Never Used  . Alcohol  use No    Review of Systems  Constitutional: No fever/chills Eyes: No visual changes.  ENT: Left ear symptoms as above Cardiovascular: Denies chest pain. Respiratory: Denies shortness of breath. Gastrointestinal: No abdominal pain.  No nausea, no vomiting.   Genitourinary: Negative for dysuria. Musculoskeletal: Negative for back pain. Skin: Negative for rash. Neurological: Negative for headaches or weakness, vertiginous symptoms as above   ____________________________________________   PHYSICAL EXAM:  VITAL SIGNS: ED Triage Vitals  Enc Vitals Group     BP 07/28/16 0647 (!) 131/54     Pulse Rate 07/28/16 0647 63     Resp 07/28/16 0647 18     Temp 07/28/16 0647 97.5 F (36.4 C)     Temp Source 07/28/16 0647 Oral     SpO2 07/28/16 0647 95 %     Weight 07/28/16 0648 63.5 kg (140 lb)     Height 07/28/16 0648 1.626 m (5\' 4" )     Head Circumference --      Peak Flow --      Pain Score --      Pain Loc --      Pain Edu? --      Excl. in [place]? --     Constitutional: Alert and oriented. No acute distress. Pleasant and interactive Eyes: Conjunctivae are normal.  Head: Atraumatic. Nose: No congestion/rhinnorhea. Mouth/Throat: Mucous membranes are moist.   Ears: Left ear no cerumen, TM visible, no abnormality Neck:  Painless ROM Cardiovascular: Normal rate, regular rhythm. Grossly normal heart sounds.  Good peripheral circulation. Respiratory: Normal respiratory effort.  No retractions. Lungs CTAB. Gastrointestinal: Soft and nontender. No distention.  No CVA tenderness. Genitourinary: deferred Musculoskeletal: No lower extremity tenderness nor edema.  Warm and well perfused Neurologic:  Normal speech and language. No gross focal neurologic deficits are appreciated.  Skin:  Skin is warm, dry and intact. No rash noted. Psychiatric: Mood and affect are normal. Speech and behavior are normal.  ____________________________________________   LABS (all labs ordered are listed,  but only abnormal results are displayed)  Labs Reviewed  CBC - Abnormal; Notable for the following:       Result Value   Platelets 110 (*)    All other components within normal limits  COMPREHENSIVE METABOLIC PANEL - Abnormal; Notable for the following:    Glucose, Bld 118 (*)    All other components within normal limits  TROPONIN I   ____________________________________________  EKG  ED ECG REPORT I, Lavonia Drafts, the attending physician, personally viewed and interpreted this ECG.  Date: 07/28/2016 EKG Time: 7:53 AM Rate: 59 Rhythm: normal sinus rhythm QRS Axis: normal Intervals: normal ST/T Wave abnormalities: normal   ____________________________________________  RADIOLOGY  MRI brain MRA brain unremarkable ____________________________________________   PROCEDURES  Procedure(s) performed: No    Critical Care performed: No ____________________________________________   INITIAL IMPRESSION / ASSESSMENT AND PLAN / ED COURSE  Pertinent labs & imaging results that were available during my care  of the patient were reviewed by me and considered in my medical decision making (see chart for details).  Patient presented with acute onset of vertigo, no neuro deficits on thorough exam. However she does describe hearing changes in the left ear over the last 2 weeks. Given these symptoms we will proceed with MRI/MRA   MRI/MRA are reassuring however the patient is to have significant vertiginous symptoms, I will admit to hospital service for further management.   ____________________________________________   FINAL CLINICAL IMPRESSION(S) / ED DIAGNOSES  Vertigo   NEW MEDICATIONS STARTED DURING THIS VISIT:  Current Discharge Medication List       Note:  This document was prepared using Dragon voice recognition software and may include unintentional dictation errors.    Lavonia Drafts, MD 07/28/16 [phone]

## 2016-07-28 NOTE — ED Notes (Signed)
Pt ambulated to the restroom. Pt denies nausea but states that she is still a little dizzy

## 2016-07-28 NOTE — ED Triage Notes (Signed)
Patient presents to Emergency Department via [facility] from home (lives alone) with complaints of vertigo.  Pt reports waking this am with Nausea and vomiting with movement.  2 weeks of echo in left ear and has ENT appt upcoming.   EMS gave 4 mg zofran IV

## 2016-07-28 NOTE — H&P (Signed)
[place] [place] NAME: Tina Silva    MR#:  [id number]  DATE OF BIRTH:  [birth year]  DATE OF ADMISSION:  07/28/2016  PRIMARY CARE PHYSICIAN: Crecencio Mc, MD   REQUESTING/REFERRING PHYSICIAN: Dr. Lavonia Drafts  CHIEF COMPLAINT:   Chief Complaint  Patient presents with  . Dizziness  . Nausea  . Emesis    HISTORY OF PRESENT ILLNESS:  Tina Silva  is a 82 y.o. female with a known history of Hypertension, hyperlipidemia, osteoporosis presents to hospital secondary to worsening vertigo episode this morning. Patient's symptoms started about 2 weeks ago when she started hearing an echo in her left ear that gradually subsided. Since then her hearing has been decreased on the left side and she feels constant fullness in that ear. She has called to get an appointment with ENT for this coming week. She hasn't had vertigo symptoms up until this morning. When she woke up this morning, she was still in bed, opened her eyes and saw that the whole room was spinning around her, especially the ceiling. She closed her eyes back, when she woke up this symptoms persisted. It is not positional. She had constant dizziness and difficulty walking secondary to the same. Denies any changes in her vision, no slurred speech or swallowing changes. No tingling numbness or other neurological changes. She was also nauseous at the same time. Came to the emergency room, MRI of the brain was done which did not reveal any acute infarcts or any tumors. But patient is very symptomatic with her dizziness and unable to walk. So she is being admitted.  PAST MEDICAL HISTORY:   Past Medical History:  Diagnosis Date  . Hyperlipidemia   . Hypertension   . Hypothyroidism   . Osteoporosis     PAST SURGICAL HISTORY:   Past Surgical History:  Procedure Laterality Date  . ABDOMINAL HYSTERECTOMY    . APPENDECTOMY  1974  . BREAST BIOPSY Left 11/04/2005   core  . COLONOSCOPY  WITH PROPOFOL N/A 02/12/2016   Procedure: COLONOSCOPY WITH PROPOFOL;  Surgeon: Manya Silvas, MD;  Location: [facility] ENDOSCOPY;  Service: Endoscopy;  Laterality: N/A;  . EYE SURGERY  june 2013   cataract with lens,  dinglein left eye   . TONSILLECTOMY    . TONSILLECTOMY AND ADENOIDECTOMY      SOCIAL HISTORY:   Social History  Substance Use Topics  . Smoking status: Never Smoker  . Smokeless tobacco: Never Used  . Alcohol use No    FAMILY HISTORY:   Family History  Problem Relation Age of Onset  . Parkinson's disease Mother   . Cancer Sister        AML  . Diabetes Sister   . Heart disease Father   . Diabetes Paternal Aunt   . Cancer Maternal Grandmother   . Breast cancer Neg Hx     DRUG ALLERGIES:   Allergies  Allergen Reactions  . Ciprofloxacin     Causes heart beat to skip  . Doxycycline Other (See Comments)    Chest pain  . Flagyl [Metronidazole]     Causes heart beat to skip  . Iodine     Hot and tremmors  . Sudafed [Pseudoephedrine Hcl]     Felt like her body would explode.    REVIEW OF SYSTEMS:   Review of Systems  Constitutional: Positive for malaise/fatigue. Negative for chills, fever and weight loss.  HENT: Positive for tinnitus. Negative for ear  discharge, ear pain, hearing loss and nosebleeds.   Eyes: Negative for blurred vision, double vision and photophobia.  Respiratory: Negative for cough, hemoptysis, shortness of breath and wheezing.   Cardiovascular: Negative for chest pain, palpitations, orthopnea and leg swelling.  Gastrointestinal: Positive for nausea. Negative for abdominal pain, constipation, diarrhea, heartburn, melena and vomiting.  Genitourinary: Negative for dysuria, frequency, hematuria and urgency.  Musculoskeletal: Negative for back pain, myalgias and neck pain.  Skin: Negative for rash.  Neurological: Positive for dizziness. Negative for tingling, tremors, sensory change, speech change, focal weakness and headaches.    Endo/Heme/Allergies: Does not bruise/bleed easily.  Psychiatric/Behavioral: Negative for depression.    MEDICATIONS AT HOME:   Prior to Admission medications   Medication Sig Start Date End Date Taking? Authorizing Provider  aspirin 81 MG tablet Take 81 mg by mouth daily.    [provider]  calcium-vitamin D (OSCAL WITH D) 500-200 MG-UNIT tablet Take 1 tablet by mouth.     [provider]  Cholecalciferol (VITAMIN D3) 1000 units CAPS Take 1 capsule by mouth.    [provider]  fluticasone (FLONASE) 50 MCG/ACT nasal spray Place 2 sprays into both nostrils daily. Patient not taking: Reported on 07/03/2016 05/24/15   Coral Spikes, DO  metoprolol tartrate (LOPRESSOR) 25 MG tablet TAKE 1 TABLET BY MOUTH TWICE DAILY. 05/06/16   Crecencio Mc, MD  Multiple Vitamin (MULTIVITAMIN) tablet Take 1 tablet by mouth daily.    [provider]  risedronate (ACTONEL) 35 MG tablet TAKE 1 TABLET BY MOUTH WEEKLY SAME DAY OF EACH WEEK. TAKE FIRST THINGON ARISING WITH A FULL GLASS OF WATER. STAYUP RIGHT FOR 30 MINUTES. 07/26/16   Crecencio Mc, MD  triamterene-hydrochlorothiazide (MAXZIDE-25) 37.5-25 MG tablet TAKE 1/2 TABLET BY MOUTH ONCE DAILY AS DIRECTED BY PHYSICIAN 05/06/16   Crecencio Mc, MD      VITAL SIGNS:  Blood pressure (!) 126/59, pulse (!) 58, temperature 97.5 F (36.4 C), temperature source Oral, resp. rate 14, height 5\' 4"  (1.626 m), weight 63.5 kg (140 lb), SpO2 98 %.  PHYSICAL EXAMINATION:   Physical Exam  GENERAL:  82 y.o.-year-old very pleasant elderly patient lying in the bed with no acute distress.  EYES: Pupils equal, round, reactive to light and accommodation. No scleral icterus. Extraocular muscles intact.  HEENT: Head atraumatic, normocephalic. Oropharynx and nasopharynx clear.  NECK:  Supple, no jugular venous distention. No thyroid enlargement, no tenderness.  LUNGS: Normal breath sounds bilaterally, no wheezing, rales,rhonchi or  crepitation. No use of accessory muscles of respiration.  CARDIOVASCULAR: S1, S2 normal. No murmurs, rubs, or gallops.  ABDOMEN: Soft, nontender, nondistended. Bowel sounds present. No organomegaly or mass.  EXTREMITIES: No pedal edema, cyanosis, or clubbing.  NEUROLOGIC: Cranial nerves II through XII are intact. Muscle strength 5/5 in all extremities. Sensation intact. Gait not checked.  PSYCHIATRIC: The patient is alert and oriented x 3.  SKIN: No obvious rash, lesion, or ulcer.   LABORATORY PANEL:   CBC  Recent Labs Lab 07/28/16 0742  WBC 7.3  HGB 14.2  HCT 41.0  PLT 110*   ------------------------------------------------------------------------------------------------------------------  Chemistries   Recent Labs Lab 07/28/16 0742  NA 138  K 3.7  CL 101  CO2 30  GLUCOSE 118*  BUN 20  CREATININE 0.89  CALCIUM 8.9  AST 23  ALT 17  ALKPHOS 53  BILITOT 0.8   ------------------------------------------------------------------------------------------------------------------  Cardiac Enzymes  Recent Labs Lab 07/28/16 0742  TROPONINI <0.03   ------------------------------------------------------------------------------------------------------------------  RADIOLOGY:  Mr  Angiogram Head Wo Contrast  Result Date: 07/28/2016 CLINICAL DATA:  Acute presentation with vertigo, nausea and vomiting. Hearing disturbance on the left over the last 2 weeks. EXAM: MRI HEAD WITHOUT CONTRAST MRA HEAD WITHOUT CONTRAST TECHNIQUE: Multiplanar, multiecho pulse sequences of the brain and surrounding structures were obtained without intravenous contrast. Angiographic images of the head were obtained using MRA technique without contrast. COMPARISON:  None. FINDINGS: MRI HEAD FINDINGS Brain: Diffusion imaging does not show any acute or subacute infarction. The brainstem and cerebellum are normal. Cerebral hemispheres show mild to moderate chronic small-vessel change of the deep and subcortical  white matter. No cortical or large vessel territory infarction. No mass lesion, hemorrhage, hydrocephalus or extra-axial collection. Vascular: Major vessels at the base of the brain show flow. Skull and upper cervical spine: Negative Sinuses/Orbits: Sinuses are clear. Orbits appear normal. Previous intra-ocular lens implant on the left. No fluid in the middle ears or mastoids. Other: None significant MRA HEAD FINDINGS Both internal carotid arteries are widely patent into the brain. No siphon stenosis. The anterior and middle cerebral vessels appear normal without proximal stenosis, aneurysm or vascular malformation. Both vertebral arteries are patent to the basilar. No basilar stenosis. Posterior circulation branch vessels are patent. Fetal origin of the right PCA. IMPRESSION: No acute finding by MRI. Mild moderate chronic small-vessel ischemic change of the cerebral hemispheric white matter. No evidence of brainstem or cerebellar insult. Normal intracranial MR angiography of the large and medium size vessels. Electronically Signed   By: Nelson Chimes M.D.   On: 07/28/2016 09:24   Mr Brain Wo Contrast  Result Date: 07/28/2016 CLINICAL DATA:  Acute presentation with vertigo, nausea and vomiting. Hearing disturbance on the left over the last 2 weeks. EXAM: MRI HEAD WITHOUT CONTRAST MRA HEAD WITHOUT CONTRAST TECHNIQUE: Multiplanar, multiecho pulse sequences of the brain and surrounding structures were obtained without intravenous contrast. Angiographic images of the head were obtained using MRA technique without contrast. COMPARISON:  None. FINDINGS: MRI HEAD FINDINGS Brain: Diffusion imaging does not show any acute or subacute infarction. The brainstem and cerebellum are normal. Cerebral hemispheres show mild to moderate chronic small-vessel change of the deep and subcortical white matter. No cortical or large vessel territory infarction. No mass lesion, hemorrhage, hydrocephalus or extra-axial collection. Vascular:  Major vessels at the base of the brain show flow. Skull and upper cervical spine: Negative Sinuses/Orbits: Sinuses are clear. Orbits appear normal. Previous intra-ocular lens implant on the left. No fluid in the middle ears or mastoids. Other: None significant MRA HEAD FINDINGS Both internal carotid arteries are widely patent into the brain. No siphon stenosis. The anterior and middle cerebral vessels appear normal without proximal stenosis, aneurysm or vascular malformation. Both vertebral arteries are patent to the basilar. No basilar stenosis. Posterior circulation branch vessels are patent. Fetal origin of the right PCA. IMPRESSION: No acute finding by MRI. Mild moderate chronic small-vessel ischemic change of the cerebral hemispheric white matter. No evidence of brainstem or cerebellar insult. Normal intracranial MR angiography of the large and medium size vessels. Electronically Signed   By: Nelson Chimes M.D.   On: 07/28/2016 09:24    EKG:   Orders placed or performed during the hospital encounter of 07/28/16  . ED EKG  . ED EKG    IMPRESSION AND PLAN:   Tina Silva  is a 82 y.o. female with a known history of Hypertension, hyperlipidemia, osteoporosis presents to hospital secondary to worsening vertigo episode this morning.  #1 vertigo-likely acute labyrinthitis. Also  has some hearing changes and tinnitus in the left ear. -Started on meclizine. IV fluids. Valium as needed. -Physical therapy in a.m. -MRI of the brain negative for any infarcts or tumors -Please confirm the ENT appointment for tomorrow and coordinate with her discharge.  #2 hypertension-continue metoprolol and Maxzide  #3 osteoporosis-continue calcium and vitamin D supplements.  #4 DVT prophylaxis-Lovenox   All the records are reviewed and case discussed with ED provider. Management plans discussed with the patient, family and they are in agreement.  CODE STATUS: Full Code  TOTAL TIME TAKING CARE OF THIS PATIENT:  50 minutes.    Tina Silva M.D on 07/28/2016 at 12:58 PM  Between 7am to 6pm - Pager - [phone]  After 6pm go to [email or web address] - password [email or web address] [facility]  Office  [phone]  CC: Primary care physician; Crecencio Mc, MD

## 2016-07-29 ENCOUNTER — Telehealth: Payer: Self-pay | Admitting: Internal Medicine

## 2016-07-29 ENCOUNTER — Telehealth: Payer: Self-pay | Admitting: *Deleted

## 2016-07-29 DIAGNOSIS — M810 Age-related osteoporosis without current pathological fracture: Secondary | ICD-10-CM | POA: Diagnosis not present

## 2016-07-29 DIAGNOSIS — H6980 Other specified disorders of Eustachian tube, unspecified ear: Secondary | ICD-10-CM | POA: Diagnosis not present

## 2016-07-29 DIAGNOSIS — E785 Hyperlipidemia, unspecified: Secondary | ICD-10-CM | POA: Diagnosis not present

## 2016-07-29 DIAGNOSIS — R42 Dizziness and giddiness: Secondary | ICD-10-CM | POA: Diagnosis not present

## 2016-07-29 DIAGNOSIS — H903 Sensorineural hearing loss, bilateral: Secondary | ICD-10-CM | POA: Diagnosis not present

## 2016-07-29 DIAGNOSIS — I10 Essential (primary) hypertension: Secondary | ICD-10-CM | POA: Diagnosis not present

## 2016-07-29 LAB — CBC
HEMATOCRIT: 40.1 % (ref 35.0–47.0)
Hemoglobin: 14.1 g/dL (ref 12.0–16.0)
MCH: 33.7 pg (ref 26.0–34.0)
MCHC: 35.1 g/dL (ref 32.0–36.0)
MCV: 96 fL (ref 80.0–100.0)
Platelets: 119 10*3/uL — ABNORMAL LOW (ref 150–440)
RBC: 4.18 MIL/uL (ref 3.80–5.20)
RDW: 12.7 % (ref 11.5–14.5)
WBC: 4.7 10*3/uL (ref 3.6–11.0)

## 2016-07-29 LAB — BASIC METABOLIC PANEL
Anion gap: 7 (ref 5–15)
BUN: 19 mg/dL (ref 6–20)
CALCIUM: 8.3 mg/dL — AB (ref 8.9–10.3)
CO2: 25 mmol/L (ref 22–32)
CREATININE: 1.16 mg/dL — AB (ref 0.44–1.00)
Chloride: 107 mmol/L (ref 101–111)
GFR calc Af Amer: 51 mL/min — ABNORMAL LOW (ref >60)
GFR calc non Af Amer: 44 mL/min — ABNORMAL LOW (ref >60)
GLUCOSE: 91 mg/dL (ref 65–99)
Potassium: 3.9 mmol/L (ref 3.5–5.1)
Sodium: 139 mmol/L (ref 135–145)

## 2016-07-29 MED ORDER — MECLIZINE HCL 12.5 MG PO TABS: 12.5000 mg | ORAL_TABLET | Freq: Three times a day (TID) | ORAL | 0 refills | Status: DC | PRN

## 2016-07-29 NOTE — Telephone Encounter (Signed)
Left message for patient to return call to office. 

## 2016-07-29 NOTE — Telephone Encounter (Signed)
HFU/ Dx was Vertigo. Pt was discharged from hospital yesterday. Pt is scheduled for 08/08/16 9:30am. Thank you!

## 2016-07-29 NOTE — Telephone Encounter (Signed)
Patient will discharge from [facility] on 07/29/16. Patient will need a HFU scheduled  Pt contact [phone]

## 2016-07-29 NOTE — Telephone Encounter (Signed)
Juliann Pulse please contact patient for hospital follow up

## 2016-07-29 NOTE — Care Management (Addendum)
Admitted to this facility under observation status with the diagnosis of vertigo. Lives alone. Tina Silva is [name] [phone]). Last seen Dr. Derrel Nip May 2018 for physical. Prescriptions are filled at [facility]. No home health. No skilled facility. No home oxygen. Rolling walker, cane, and bedside commode in the home if needed, Takes care of all basic and instrumental activities of daily living herself, drives. No falls. Great appetite. Neighbor [name] or friend, Scot Dock will transport. Shelbie Ammons RN MSN CCM Care Management [phone]

## 2016-07-29 NOTE — Progress Notes (Signed)
Discharge paperwork reviewed with patient who verbalized understanding. New prescription for Meclizine given to patient- patient understands she took a dose this morning and how to take once home. Patient is stable and ready for discharge. Patient has already scheduled appt for ENT this afternoon. Patient's friend, Baker Janus, to transport home.

## 2016-07-29 NOTE — Care Management Obs Status (Signed)
[facility] NOTIFICATION   Patient Details  Name: Tina Silva MRN: [id number] Date of Birth: January 11, 1935   Medicare Observation Status Notification Given:  Yes    Shelbie Ammons, RN 07/29/2016, 9:10 AM

## 2016-07-29 NOTE — Discharge Summary (Signed)
[facility] [place] NAME: Tina Silva    MR#:  [id number]  DATE OF BIRTH:  04-30-1934  DATE OF ADMISSION:  07/28/2016 ADMITTING PHYSICIAN: Gladstone Lighter, MD  DATE OF DISCHARGE: 07/29/2016 11:10 AM  PRIMARY CARE PHYSICIAN: Crecencio Mc, MD    ADMISSION DIAGNOSIS:  vertigo  DISCHARGE DIAGNOSIS:  Active Problems:   Vertigo   SECONDARY DIAGNOSIS:   Past Medical History:  Diagnosis Date  . Hyperlipidemia   . Hypertension   . Hypothyroidism   . Osteoporosis     HOSPITAL COURSE:   Tina Silva  is a 82 y.o. female with a known history of Hypertension, hyperlipidemia, osteoporosis presents to hospital secondary to worsening vertigo episode this morning.  #1 vertigo-likely acute labyrinthitis.  -Patient was admitted to the hospital and treated supportively with Valium and meclizine. Her symptoms have significantly improved with that. She is being discharged home on meclizine as needed. She underwent MRI and MRA of the brain which was essentially normal. She has an ENT follow-up coming up this afternoon.  - she was given a prescription for Meclizine upon discharge.   #2 hypertension- she will continue metoprolol and Maxzide  #3 osteoporosis-she will continue calcium and vitamin D supplements.  DISCHARGE CONDITIONS:   Stable.   CONSULTS OBTAINED:    DRUG ALLERGIES:   Allergies  Allergen Reactions  . Ciprofloxacin     Causes heart beat to skip  . Doxycycline Other (See Comments)    Chest pain  . Flagyl [Metronidazole]     Causes heart beat to skip  . Iodine     Hot and tremmors  . Sudafed [Pseudoephedrine Hcl]     Felt like her body would explode.    DISCHARGE MEDICATIONS:   Allergies as of 07/29/2016      Reactions   Ciprofloxacin    Causes heart beat to skip   Doxycycline Other (See Comments)   Chest pain   Flagyl [metronidazole]    Causes heart beat to skip   Iodine    Hot and tremmors   Sudafed  [pseudoephedrine Hcl]    Felt like her body would explode.      Medication List    TAKE these medications   aspirin 81 MG tablet Take 81 mg by mouth daily.   calcium-vitamin D 500-200 MG-UNIT tablet Commonly known as:  OSCAL WITH D Take 1 tablet by mouth.   fluticasone 50 MCG/ACT nasal spray Commonly known as:  FLONASE Place 2 sprays into both nostrils daily.   meclizine 12.5 MG tablet Commonly known as:  ANTIVERT Take 1 tablet (12.5 mg total) by mouth 3 (three) times daily as needed for dizziness.   metoprolol tartrate 25 MG tablet Commonly known as:  LOPRESSOR TAKE 1 TABLET BY MOUTH TWICE DAILY.   multivitamin tablet Take 1 tablet by mouth daily.   risedronate 35 MG tablet Commonly known as:  ACTONEL TAKE 1 TABLET BY MOUTH WEEKLY SAME DAY OF EACH WEEK. TAKE FIRST THINGON ARISING WITH A FULL GLASS OF WATER. STAYUP RIGHT FOR 30 MINUTES.   triamterene-hydrochlorothiazide 37.5-25 MG tablet Commonly known as:  MAXZIDE-25 TAKE 1/2 TABLET BY MOUTH ONCE DAILY AS DIRECTED BY PHYSICIAN   Vitamin D3 1000 units Caps Take 1 capsule by mouth.         DISCHARGE INSTRUCTIONS:   DIET:  Cardiac diet  DISCHARGE CONDITION:  Stable  ACTIVITY:  Activity as tolerated  OXYGEN:  Home Oxygen: No.   Oxygen Delivery: room  air  DISCHARGE LOCATION:  home   If you experience worsening of your admission symptoms, develop shortness of breath, life threatening emergency, suicidal or homicidal thoughts you must seek medical attention immediately by calling 911 or calling your MD immediately  if symptoms less severe.  You Must read complete instructions/literature along with all the possible adverse reactions/side effects for all the Medicines you take and that have been prescribed to you. Take any new Medicines after you have completely understood and accpet all the possible adverse reactions/side effects.   Please note  You were cared for by a hospitalist during your hospital  stay. If you have any questions about your discharge medications or the care you received while you were in the hospital after you are discharged, you can call the unit and asked to speak with the hospitalist on call if the hospitalist that took care of you is not available. Once you are discharged, your primary care physician will handle any further medical issues. Please note that NO REFILLS for any discharge medications will be authorized once you are discharged, as it is imperative that you return to your primary care physician (or establish a relationship with a primary care physician if you do not have one) for your aftercare needs so that they can reassess your need for medications and monitor your lab values.     Today   No further Vertigo/N/V and feels much better. Wants to go home. Has EnT follow up later today.   VITAL SIGNS:  Blood pressure (!) 116/53, pulse 72, temperature 97.7 F (36.5 C), temperature source Oral, resp. rate 20, height 5\' 4"  (1.626 m), weight 63.6 kg (140 lb 4.8 oz), SpO2 98 %.  I/O:   Intake/Output Summary (Last 24 hours) at 07/29/16 1544 Last data filed at 07/29/16 0929  Gross per 24 hour  Intake             1275 ml  Output                0 ml  Net             1275 ml    PHYSICAL EXAMINATION:  GENERAL:  82 y.o.-year-old patient lying in the bed with no acute distress.  EYES: Pupils equal, round, reactive to light and accommodation. No scleral icterus. Extraocular muscles intact.  HEENT: Head atraumatic, normocephalic. Oropharynx and nasopharynx clear.  NECK:  Supple, no jugular venous distention. No thyroid enlargement, no tenderness.  LUNGS: Normal breath sounds bilaterally, no wheezing, rales,rhonchi. No use of accessory muscles of respiration.  CARDIOVASCULAR: S1, S2 normal. No murmurs, rubs, or gallops.  ABDOMEN: Soft, non-tender, non-distended. Bowel sounds present. No organomegaly or mass.  EXTREMITIES: No pedal edema, cyanosis, or clubbing.   NEUROLOGIC: Cranial nerves II through XII are intact. No focal motor or sensory defecits b/l.  PSYCHIATRIC: The patient is alert and oriented x 3.   SKIN: No obvious rash, lesion, or ulcer.   DATA REVIEW:   CBC  Recent Labs Lab 07/29/16 0526  WBC 4.7  HGB 14.1  HCT 40.1  PLT 119*    Chemistries   Recent Labs Lab 07/28/16 0742 07/29/16 0526  NA 138 139  K 3.7 3.9  CL 101 107  CO2 30 25  GLUCOSE 118* 91  BUN 20 19  CREATININE 0.89 1.16*  CALCIUM 8.9 8.3*  AST 23  --   ALT 17  --   ALKPHOS 53  --   BILITOT 0.8  --  Cardiac Enzymes  Recent Labs Lab 07/28/16 0742  TROPONINI <0.03    Microbiology Results  No results found for this or any previous visit.  RADIOLOGY:  Mr Angiogram Head Wo Contrast  Result Date: 07/28/2016 CLINICAL DATA:  Acute presentation with vertigo, nausea and vomiting. Hearing disturbance on the left over the last 2 weeks. EXAM: MRI HEAD WITHOUT CONTRAST MRA HEAD WITHOUT CONTRAST TECHNIQUE: Multiplanar, multiecho pulse sequences of the brain and surrounding structures were obtained without intravenous contrast. Angiographic images of the head were obtained using MRA technique without contrast. COMPARISON:  None. FINDINGS: MRI HEAD FINDINGS Brain: Diffusion imaging does not show any acute or subacute infarction. The brainstem and cerebellum are normal. Cerebral hemispheres show mild to moderate chronic small-vessel change of the deep and subcortical white matter. No cortical or large vessel territory infarction. No mass lesion, hemorrhage, hydrocephalus or extra-axial collection. Vascular: Major vessels at the base of the brain show flow. Skull and upper cervical spine: Negative Sinuses/Orbits: Sinuses are clear. Orbits appear normal. Previous intra-ocular lens implant on the left. No fluid in the middle ears or mastoids. Other: None significant MRA HEAD FINDINGS Both internal carotid arteries are widely patent into the brain. No siphon stenosis.  The anterior and middle cerebral vessels appear normal without proximal stenosis, aneurysm or vascular malformation. Both vertebral arteries are patent to the basilar. No basilar stenosis. Posterior circulation branch vessels are patent. Fetal origin of the right PCA. IMPRESSION: No acute finding by MRI. Mild moderate chronic small-vessel ischemic change of the cerebral hemispheric white matter. No evidence of brainstem or cerebellar insult. Normal intracranial MR angiography of the large and medium size vessels. Electronically Signed   By: Nelson Chimes M.D.   On: 07/28/2016 09:24   Mr Brain Wo Contrast  Result Date: 07/28/2016 CLINICAL DATA:  Acute presentation with vertigo, nausea and vomiting. Hearing disturbance on the left over the last 2 weeks. EXAM: MRI HEAD WITHOUT CONTRAST MRA HEAD WITHOUT CONTRAST TECHNIQUE: Multiplanar, multiecho pulse sequences of the brain and surrounding structures were obtained without intravenous contrast. Angiographic images of the head were obtained using MRA technique without contrast. COMPARISON:  None. FINDINGS: MRI HEAD FINDINGS Brain: Diffusion imaging does not show any acute or subacute infarction. The brainstem and cerebellum are normal. Cerebral hemispheres show mild to moderate chronic small-vessel change of the deep and subcortical white matter. No cortical or large vessel territory infarction. No mass lesion, hemorrhage, hydrocephalus or extra-axial collection. Vascular: Major vessels at the base of the brain show flow. Skull and upper cervical spine: Negative Sinuses/Orbits: Sinuses are clear. Orbits appear normal. Previous intra-ocular lens implant on the left. No fluid in the middle ears or mastoids. Other: None significant MRA HEAD FINDINGS Both internal carotid arteries are widely patent into the brain. No siphon stenosis. The anterior and middle cerebral vessels appear normal without proximal stenosis, aneurysm or vascular malformation. Both vertebral arteries  are patent to the basilar. No basilar stenosis. Posterior circulation branch vessels are patent. Fetal origin of the right PCA. IMPRESSION: No acute finding by MRI. Mild moderate chronic small-vessel ischemic change of the cerebral hemispheric white matter. No evidence of brainstem or cerebellar insult. Normal intracranial MR angiography of the large and medium size vessels. Electronically Signed   By: Nelson Chimes M.D.   On: 07/28/2016 09:24      Management plans discussed with the patient, family and they are in agreement.  CODE STATUS:  Code Status History    Date Active Date Inactive Code Status Order  ID Comments User Context   07/28/2016  2:17 PM 07/29/2016  2:10 PM Full Code [id number]  Gladstone Lighter, MD ED    Advance Directive Documentation     Most Recent Value  Type of Advance Directive  Healthcare Power of Attorney, Living will  Pre-existing out of facility DNR order (yellow form or pink MOST form)  -  "MOST" Form in Place?  -      TOTAL TIME TAKING CARE OF THIS PATIENT: 40 minutes.    Henreitta Leber M.D on 07/29/2016 at 3:44 PM  Between 7am to 6pm - Pager - [phone]  After 6pm go to [email or web address] - Proofreader  [facility]  Office  [phone]  CC: Primary care physician; Crecencio Mc, MD

## 2016-07-29 NOTE — Telephone Encounter (Signed)
Transition Care Management Follow-up Telephone Call  How have you been since you were released from the hospital? No dizziness since being home.   Do you understand why you were in the hospital? Yes   Do you understand the discharge instrcutions? Yes  Items Reviewed:  Medications reviewed: yes  Allergies reviewed: yes  Dietary changes reviewed: yes  Referrals reviewed: yes   Functional Questionnaire:   Activities of Daily Living (ADLs):   She states they are independent in the following: independent in ADLs States they require assistance with the following: No assist   Any transportation issues/concerns?: no   Any patient concerns?No    Confirmed importance and date/time of follow-up visits scheduled: yes   Confirmed with patient if condition begins to worsen call PCP or go to the ER.  Patient was given the Call-a-Nurse line [phone]: Yes

## 2016-07-29 NOTE — Progress Notes (Signed)
Pt feels back to normal this morning. No nausea/vomitting/dizziness. Steady gait to bathroom despite "plantar fascitis flare up" per pt.

## 2016-07-29 NOTE — Telephone Encounter (Signed)
FYI

## 2016-08-01 DIAGNOSIS — L565 Disseminated superficial actinic porokeratosis (DSAP): Secondary | ICD-10-CM | POA: Diagnosis not present

## 2016-08-01 DIAGNOSIS — L814 Other melanin hyperpigmentation: Secondary | ICD-10-CM | POA: Diagnosis not present

## 2016-08-01 DIAGNOSIS — L720 Epidermal cyst: Secondary | ICD-10-CM | POA: Diagnosis not present

## 2016-08-01 DIAGNOSIS — X32XXXA Exposure to sunlight, initial encounter: Secondary | ICD-10-CM | POA: Diagnosis not present

## 2016-08-01 DIAGNOSIS — L570 Actinic keratosis: Secondary | ICD-10-CM | POA: Diagnosis not present

## 2016-08-01 DIAGNOSIS — L821 Other seborrheic keratosis: Secondary | ICD-10-CM | POA: Diagnosis not present

## 2016-08-08 ENCOUNTER — Ambulatory Visit: Payer: Medicare Other | Admitting: Internal Medicine

## 2016-08-09 DIAGNOSIS — M659 Synovitis and tenosynovitis, unspecified: Secondary | ICD-10-CM | POA: Diagnosis not present

## 2016-08-09 DIAGNOSIS — M79672 Pain in left foot: Secondary | ICD-10-CM | POA: Diagnosis not present

## 2016-08-16 ENCOUNTER — Ambulatory Visit (INDEPENDENT_AMBULATORY_CARE_PROVIDER_SITE_OTHER): Payer: Medicare Other | Admitting: Internal Medicine

## 2016-08-16 ENCOUNTER — Encounter: Payer: Self-pay | Admitting: Internal Medicine

## 2016-08-16 VITALS — BP 136/72 | HR 74 | Temp 98.4°F | Resp 15 | Ht 64.0 in | Wt 138.6 lb

## 2016-08-16 DIAGNOSIS — R944 Abnormal results of kidney function studies: Secondary | ICD-10-CM

## 2016-08-16 DIAGNOSIS — R42 Dizziness and giddiness: Secondary | ICD-10-CM

## 2016-08-16 DIAGNOSIS — E873 Alkalosis: Secondary | ICD-10-CM

## 2016-08-16 DIAGNOSIS — I10 Essential (primary) hypertension: Secondary | ICD-10-CM | POA: Diagnosis not present

## 2016-08-16 DIAGNOSIS — E785 Hyperlipidemia, unspecified: Secondary | ICD-10-CM

## 2016-08-16 LAB — LIPID PANEL
CHOL/HDL RATIO: 4
Cholesterol: 161 mg/dL (ref 0–200)
HDL: 41.7 mg/dL (ref >39.00)
LDL CALC: 92 mg/dL (ref 0–99)
NonHDL: 118.9
TRIGLYCERIDES: 133 mg/dL (ref 0.0–149.0)
VLDL: 26.6 mg/dL (ref 0.0–40.0)

## 2016-08-16 LAB — COMPREHENSIVE METABOLIC PANEL
ALT: 14 U/L (ref 0–35)
AST: 18 U/L (ref 0–37)
Albumin: 4.2 g/dL (ref 3.5–5.2)
Alkaline Phosphatase: 55 U/L (ref 39–117)
BUN: 23 mg/dL (ref 6–23)
CHLORIDE: 102 meq/L (ref 96–112)
CO2: 34 meq/L — AB (ref 19–32)
CREATININE: 1.17 mg/dL (ref 0.40–1.20)
Calcium: 10.1 mg/dL (ref 8.4–10.5)
GFR: 47.35 mL/min — ABNORMAL LOW (ref >60.00)
GLUCOSE: 100 mg/dL — AB (ref 70–99)
Potassium: 4.3 mEq/L (ref 3.5–5.1)
SODIUM: 140 meq/L (ref 135–145)
Total Bilirubin: 0.6 mg/dL (ref 0.2–1.2)
Total Protein: 6.5 g/dL (ref 6.0–8.3)

## 2016-08-16 MED ORDER — METOPROLOL TARTRATE 25 MG PO TABS: 25.0000 mg | ORAL_TABLET | Freq: Two times a day (BID) | ORAL | 5 refills | Status: DC

## 2016-08-16 MED ORDER — ONDANSETRON 4 MG PO TBDP: 4.0000 mg | ORAL_TABLET | Freq: Three times a day (TID) | ORAL | 0 refills | Status: DC | PRN

## 2016-08-16 NOTE — Patient Instructions (Addendum)
You can suspend the triamterene fluid pill because of low blood pressure  continue twice daily metoprolol for blood pressure  Check BP in one week     Sending generic Zofran to pharmacy to keep on hand for next vertigo episode that occurs with nausea  Consider resuming flonase to keep your Eustachian tubes open

## 2016-08-16 NOTE — Progress Notes (Signed)
Subjective:  Patient ID: Tina Silva, female    DOB: [birth year]  Age: 82 y.o. MRN: [id number]  CC: The primary encounter diagnosis was Hyperlipidemia LDL goal <130. Diagnoses of Hypocalcemia, Essential hypertension, Vertigo, Decreased GFR, and Metabolic alkalosis were also pertinent to this visit.  HPI Tina Silva presents for hospital follow up  Admitted to [facility] on July 1 with sudden onset of vertigo accompanied by nausea and vomiting, preceeded by a 2 week history of altered hearing in her left ear . Was unable to walk  Du eot severity of symptoms and was admitted for possible CVA.   MRI /MRA essentially normal.  Treated for acute labrynthitis and discharged on July 2 in improved condition.   labs and imaging studies reviewed .  Treated for labrynthitis and discharged in improved condition on July 2   Her vertigo has resolved.  However she is noting that her blood pressures have been "low" at home using Omran .  115/50 at home is her average. Reports decreased energy . Taking metoprolo and triamterene .    Outpatient Medications Prior to Visit  Medication Sig Dispense Refill  . aspirin 81 MG tablet Take 81 mg by mouth daily.    . calcium-vitamin D (OSCAL WITH D) 500-200 MG-UNIT tablet Take 1 tablet by mouth.     . Cholecalciferol (VITAMIN D3) 1000 units CAPS Take 1 capsule by mouth.    . fluticasone (FLONASE) 50 MCG/ACT nasal spray Place 2 sprays into both nostrils daily. 16 g 6  . meclizine (ANTIVERT) 12.5 MG tablet Take 1 tablet (12.5 mg total) by mouth 3 (three) times daily as needed for dizziness. 30 tablet 0  . Multiple Vitamin (MULTIVITAMIN) tablet Take 1 tablet by mouth daily.    . risedronate (ACTONEL) 35 MG tablet TAKE 1 TABLET BY MOUTH WEEKLY SAME DAY OF EACH WEEK. TAKE FIRST THINGON ARISING WITH A FULL GLASS OF WATER. STAYUP RIGHT FOR 30 MINUTES. 12 tablet 1  . metoprolol tartrate (LOPRESSOR) 25 MG tablet TAKE 1 TABLET BY MOUTH TWICE DAILY. 60 tablet 3  .  triamterene-hydrochlorothiazide (MAXZIDE-25) 37.5-25 MG tablet TAKE 1/2 TABLET BY MOUTH ONCE DAILY AS DIRECTED BY PHYSICIAN 30 tablet 3   No facility-administered medications prior to visit.     Review of Systems;  Patient denies headache, fevers, malaise, unintentional weight loss, skin rash, eye pain, sinus congestion and sinus pain, sore throat, dysphagia,  hemoptysis , cough, dyspnea, wheezing, chest pain, palpitations, orthopnea, edema, abdominal pain, nausea, melena, diarrhea, constipation, flank pain, dysuria, hematuria, urinary  Frequency, nocturia, numbness, tingling, seizures,  Focal weakness, Loss of consciousness,  Tremor, insomnia, depression, anxiety, and suicidal ideation.      Objective:  BP 136/72 (BP Location: Left Arm, Patient Position: Sitting, Cuff Size: Normal)   Pulse 74   Temp 98.4 F (36.9 C) (Oral)   Resp 15   Ht 5\' 4"  (1.626 m)   Wt 138 lb 9.6 oz (62.9 kg)   SpO2 96%   BMI 23.79 kg/m   BP Readings from Last 3 Encounters:  08/16/16 136/72  07/29/16 (!) 116/53  06/27/16 130/72    Wt Readings from Last 3 Encounters:  08/16/16 138 lb 9.6 oz (62.9 kg)  07/28/16 140 lb 4.8 oz (63.6 kg)  07/03/16 141 lb 14.4 oz (64.4 kg)    General appearance: alert, cooperative and appears stated age Ears: normal TM's and external ear canals both ears Throat: lips, mucosa, and tongue normal; teeth and gums normal Neck: no adenopathy, no  carotid bruit, supple, symmetrical, trachea midline and thyroid not enlarged, symmetric, no tenderness/mass/nodules Back: symmetric, no curvature. ROM normal. No CVA tenderness. Lungs: clear to auscultation bilaterally Heart: regular rate and rhythm, S1, S2 normal, no murmur, click, rub or gallop Abdomen: soft, non-tender; bowel sounds normal; no masses,  no organomegaly Pulses: 2+ and symmetric Skin: Skin color, texture, turgor normal. No rashes or lesions Lymph nodes: Cervical, supraclavicular, and axillary nodes normal.  Lab  Results  Component Value Date   HGBA1C 5.6 01/10/2012    Lab Results  Component Value Date   CREATININE 1.17 08/16/2016   CREATININE 1.16 (H) 07/29/2016   CREATININE 0.89 07/28/2016    Lab Results  Component Value Date   WBC 4.7 07/29/2016   HGB 14.1 07/29/2016   HCT 40.1 07/29/2016   PLT 119 (L) 07/29/2016   GLUCOSE 100 (H) 08/16/2016   CHOL 161 08/16/2016   TRIG 133.0 08/16/2016   HDL 41.70 08/16/2016   LDLCALC 92 08/16/2016   ALT 14 08/16/2016   AST 18 08/16/2016   NA 140 08/16/2016   K 4.3 08/16/2016   CL 102 08/16/2016   CREATININE 1.17 08/16/2016   BUN 23 08/16/2016   CO2 34 (H) 08/16/2016   TSH 3.12 06/20/2016   HGBA1C 5.6 01/10/2012    Mr Angiogram Head Wo Contrast  Result Date: 07/28/2016 CLINICAL DATA:  Acute presentation with vertigo, nausea and vomiting. Hearing disturbance on the left over the last 2 weeks. EXAM: MRI HEAD WITHOUT CONTRAST MRA HEAD WITHOUT CONTRAST TECHNIQUE: Multiplanar, multiecho pulse sequences of the brain and surrounding structures were obtained without intravenous contrast. Angiographic images of the head were obtained using MRA technique without contrast. COMPARISON:  None. FINDINGS: MRI HEAD FINDINGS Brain: Diffusion imaging does not show any acute or subacute infarction. The brainstem and cerebellum are normal. Cerebral hemispheres show mild to moderate chronic small-vessel change of the deep and subcortical white matter. No cortical or large vessel territory infarction. No mass lesion, hemorrhage, hydrocephalus or extra-axial collection. Vascular: Major vessels at the base of the brain show flow. Skull and upper cervical spine: Negative Sinuses/Orbits: Sinuses are clear. Orbits appear normal. Previous intra-ocular lens implant on the left. No fluid in the middle ears or mastoids. Other: None significant MRA HEAD FINDINGS Both internal carotid arteries are widely patent into the brain. No siphon stenosis. The anterior and middle cerebral  vessels appear normal without proximal stenosis, aneurysm or vascular malformation. Both vertebral arteries are patent to the basilar. No basilar stenosis. Posterior circulation branch vessels are patent. Fetal origin of the right PCA. IMPRESSION: No acute finding by MRI. Mild moderate chronic small-vessel ischemic change of the cerebral hemispheric white matter. No evidence of brainstem or cerebellar insult. Normal intracranial MR angiography of the large and medium size vessels. Electronically Signed   By: Nelson Chimes M.D.   On: 07/28/2016 09:24   Mr Brain Wo Contrast  Result Date: 07/28/2016 CLINICAL DATA:  Acute presentation with vertigo, nausea and vomiting. Hearing disturbance on the left over the last 2 weeks. EXAM: MRI HEAD WITHOUT CONTRAST MRA HEAD WITHOUT CONTRAST TECHNIQUE: Multiplanar, multiecho pulse sequences of the brain and surrounding structures were obtained without intravenous contrast. Angiographic images of the head were obtained using MRA technique without contrast. COMPARISON:  None. FINDINGS: MRI HEAD FINDINGS Brain: Diffusion imaging does not show any acute or subacute infarction. The brainstem and cerebellum are normal. Cerebral hemispheres show mild to moderate chronic small-vessel change of the deep and subcortical white matter. No cortical or  large vessel territory infarction. No mass lesion, hemorrhage, hydrocephalus or extra-axial collection. Vascular: Major vessels at the base of the brain show flow. Skull and upper cervical spine: Negative Sinuses/Orbits: Sinuses are clear. Orbits appear normal. Previous intra-ocular lens implant on the left. No fluid in the middle ears or mastoids. Other: None significant MRA HEAD FINDINGS Both internal carotid arteries are widely patent into the brain. No siphon stenosis. The anterior and middle cerebral vessels appear normal without proximal stenosis, aneurysm or vascular malformation. Both vertebral arteries are patent to the basilar. No  basilar stenosis. Posterior circulation branch vessels are patent. Fetal origin of the right PCA. IMPRESSION: No acute finding by MRI. Mild moderate chronic small-vessel ischemic change of the cerebral hemispheric white matter. No evidence of brainstem or cerebellar insult. Normal intracranial MR angiography of the large and medium size vessels. Electronically Signed   By: Nelson Chimes M.D.   On: 07/28/2016 09:24    Assessment & Plan:   Problem List Items Addressed This Visit    Hypertension    Stopping diuretic given soft bps at home and persistent fatigue. Continue metoprolol       Relevant Medications   metoprolol tartrate (LOPRESSOR) 25 MG tablet   Hypocalcemia    Acute, in the setting of vertigo and vomiting with decreased GFR from volume loss.  Repeat GFR is still off but her ionized  calcium is normal .  advised to suspend her diuretic and repeat labs in 1 week.   Lab Results  Component Value Date   CREATININE 1.17 08/16/2016   Lab Results  Component Value Date   NA 140 08/16/2016   K 4.3 08/16/2016   CL 102 08/16/2016   CO2 34 (H) 08/16/2016         Relevant Orders   Comprehensive metabolic panel (Completed)   Calcium, ionized (Completed)   Vertigo    Secondary to labrynthitis.  Hospital records reviewed including MR/MRA of brain.  Suggested she resume flonase.         Other Visit Diagnoses    Hyperlipidemia LDL goal <130    -  Primary   Relevant Medications   metoprolol tartrate (LOPRESSOR) 25 MG tablet   Other Relevant Orders   Lipid panel (Completed)   Decreased GFR       Metabolic alkalosis       Relevant Orders   Basic metabolic panel      I have discontinued Ms. Hutzler's triamterene-hydrochlorothiazide. I have also changed her metoprolol tartrate. Additionally, I am having her start on ondansetron. Lastly, I am having her maintain her multivitamin, aspirin, calcium-vitamin D, Vitamin D3, fluticasone, risedronate, and meclizine.  Meds ordered this  encounter  Medications  . ondansetron (ZOFRAN ODT) 4 MG disintegrating tablet    Sig: Take 1 tablet (4 mg total) by mouth every 8 (eight) hours as needed for nausea or vomiting.    Dispense:  20 tablet    Refill:  0  . metoprolol tartrate (LOPRESSOR) 25 MG tablet    Sig: Take 1 tablet (25 mg total) by mouth 2 (two) times daily.    Dispense:  60 tablet    Refill:  5    Medications Discontinued During This Encounter  Medication Reason  . metoprolol tartrate (LOPRESSOR) 25 MG tablet Reorder  . triamterene-hydrochlorothiazide (MAXZIDE-25) 37.5-25 MG tablet     Follow-up: No Follow-up on file.   Crecencio Mc, MD

## 2016-08-17 LAB — CALCIUM, IONIZED: Calcium, Ion: 5.4 mg/dL (ref 4.8–5.6)

## 2016-08-18 ENCOUNTER — Encounter: Payer: Self-pay | Admitting: Internal Medicine

## 2016-08-18 NOTE — Assessment & Plan Note (Signed)
Stopping diuretic given soft bps at home and persistent fatigue. Continue metoprolol

## 2016-08-18 NOTE — Assessment & Plan Note (Addendum)
Acute, in the setting of vertigo and vomiting with decreased GFR from volume loss.  Repeat GFR is still off but her ionized  calcium is normal .  advised to suspend her diuretic and repeat labs in 1 week.   Lab Results  Component Value Date   CREATININE 1.17 08/16/2016   Lab Results  Component Value Date   NA 140 08/16/2016   K 4.3 08/16/2016   CL 102 08/16/2016   CO2 34 (H) 08/16/2016

## 2016-08-18 NOTE — Assessment & Plan Note (Signed)
Secondary to labrynthitis.  Hospital records reviewed including MR/MRA of brain.  Suggested she resume flonase.

## 2016-08-23 ENCOUNTER — Telehealth: Payer: Self-pay | Admitting: Internal Medicine

## 2016-08-23 NOTE — Telephone Encounter (Signed)
7/20 bp was 136/72, called in new reading today, please advise, thanks

## 2016-08-23 NOTE — Telephone Encounter (Signed)
Pt called in a blood pressure ready from this morning. At 8:30 am today her blood pressure was 113/65 with a pulse of 72. Please advise, thank you!

## 2016-08-23 NOTE — Telephone Encounter (Signed)
Spoke with the patient, she is feeling much better, decreased fatigue, she got a new BP cuff and she recognized that she had some low numbers but it was how she was putting the cuff on.  Her recent readings are all above the 110/70 so I advised to continue the Metoprolol.  He pulse readings have been in the low to mid 70's.  No complaints at this time.  She will continue to check her BP and let us know if it increases or decreases.  Thanks!!

## 2016-08-23 NOTE — Telephone Encounter (Signed)
BAckground:  On 7/20 she was reporting  low bps and fatigue.  hctz was stopped, metoprolol continued .  How does she feel now,  And has she had any bps below 117/70?  If she 1) feels better and 2) bp above 110/70 ,  Continue metoprolol.  If not, and below,  Reduce metoprolol to 1/2 tablet twice daily

## 2016-10-18 ENCOUNTER — Ambulatory Visit (INDEPENDENT_AMBULATORY_CARE_PROVIDER_SITE_OTHER): Payer: Medicare Other | Admitting: *Deleted

## 2016-10-18 DIAGNOSIS — Z23 Encounter for immunization: Secondary | ICD-10-CM | POA: Diagnosis not present

## 2016-10-21 DIAGNOSIS — M659 Synovitis and tenosynovitis, unspecified: Secondary | ICD-10-CM | POA: Diagnosis not present

## 2016-10-29 ENCOUNTER — Ambulatory Visit (INDEPENDENT_AMBULATORY_CARE_PROVIDER_SITE_OTHER): Payer: Medicare Other | Admitting: Family

## 2016-10-29 ENCOUNTER — Other Ambulatory Visit (HOSPITAL_COMMUNITY)
Admission: RE | Admit: 2016-10-29 | Discharge: 2016-10-29 | Disposition: A | Discharge status: Home / Self Care | Payer: Medicare Other | Source: Ambulatory Visit | Attending: Family | Admitting: Family

## 2016-10-29 ENCOUNTER — Encounter: Payer: Self-pay | Admitting: Family

## 2016-10-29 VITALS — BP 132/68 | HR 73 | Temp 98.1°F | Ht 64.0 in | Wt 139.6 lb

## 2016-10-29 DIAGNOSIS — N939 Abnormal uterine and vaginal bleeding, unspecified: Secondary | ICD-10-CM

## 2016-10-29 DIAGNOSIS — R319 Hematuria, unspecified: Secondary | ICD-10-CM | POA: Insufficient documentation

## 2016-10-29 DIAGNOSIS — R300 Dysuria: Secondary | ICD-10-CM | POA: Diagnosis not present

## 2016-10-29 LAB — URINALYSIS, MICROSCOPIC ONLY: RBC / HPF: NONE SEEN (ref 0–?)

## 2016-10-29 LAB — POCT URINALYSIS DIPSTICK
BILIRUBIN UA: NEGATIVE
Blood, UA: NEGATIVE
GLUCOSE UA: NEGATIVE
Ketones, UA: NEGATIVE
LEUKOCYTES UA: NEGATIVE
NITRITE UA: NEGATIVE
PH UA: 6 (ref 5.0–8.0)
Spec Grav, UA: 1.025 (ref 1.010–1.025)
Urobilinogen, UA: 0.2 E.U./dL

## 2016-10-29 NOTE — Patient Instructions (Signed)
Again, lets await pap smear and micro of urine to identify source of blood  Please stay vigilant and let me know if any more bleeding  Stools cards as well  If there is no improvement in your symptoms, or if there is any worsening of symptoms, or if you have any additional concerns, please return for re-evaluation; or, if we are closed, consider going to the Emergency Room for evaluation if symptoms urgent.

## 2016-10-29 NOTE — Progress Notes (Signed)
Pre visit review using our clinic review tool, if applicable. No additional management support is needed unless otherwise documented below in the visit note. 

## 2016-10-30 LAB — URINE CULTURE
MICRO NUMBER:: 81092321
RESULT: NO GROWTH
SPECIMEN QUALITY: ADEQUATE

## 2016-10-30 NOTE — Progress Notes (Signed)
Subjective:    Patient ID: Tina Silva, female    DOB: Jul 14, 1934, 82 y.o.   MRN: [id number]  CC: Tina Silva is a 82 y.o. female who presents today for an acute visit.    HPI: CC: hematuria episodes x 4 days ago. Noted blood tinged urine in underwear on 2 occasions. No other symptoms . Otherwise feels well.   Blood is not coming from rectum. No blood in stool or coming from vagina. Notes blood in urine.   Recently started on naproxen by podiatry, started 4 days ago and wanders if contributory.   No recent UTIs, No CKD.   Colonoscopy utd; internal hemorrhoids.   H/o hystecectomy      HISTORY:  Past Medical History:  Diagnosis Date  . Hyperlipidemia   . Hypertension   . Hypothyroidism   . Osteoporosis    Past Surgical History:  Procedure Laterality Date  . ABDOMINAL HYSTERECTOMY    . APPENDECTOMY  1974  . BREAST BIOPSY Left 11/04/2005   core  . COLONOSCOPY WITH PROPOFOL N/A 02/12/2016   Procedure: COLONOSCOPY WITH PROPOFOL;  Surgeon: Manya Silvas, MD;  Location: [facility] ENDOSCOPY;  Service: Endoscopy;  Laterality: N/A;  . EYE SURGERY  june 2013   cataract with lens,  dinglein left eye   . TONSILLECTOMY    . TONSILLECTOMY AND ADENOIDECTOMY     Family History  Problem Relation Age of Onset  . Parkinson's disease Mother   . Cancer Sister        AML  . Diabetes Sister   . Heart disease Father   . Diabetes Paternal Aunt   . Cancer Maternal Grandmother   . Breast cancer Neg Hx     Allergies: Ciprofloxacin; Doxycycline; Flagyl [metronidazole]; Iodine; and Sudafed [pseudoephedrine hcl] Current Outpatient Prescriptions on File Prior to Visit  Medication Sig Dispense Refill  . aspirin 81 MG tablet Take 81 mg by mouth daily.    . calcium-vitamin D (OSCAL WITH D) 500-200 MG-UNIT tablet Take 1 tablet by mouth.     . Cholecalciferol (VITAMIN D3) 1000 units CAPS Take 1 capsule by mouth.    . fluticasone (FLONASE) 50 MCG/ACT nasal spray Place 2 sprays into both  nostrils daily. 16 g 6  . meclizine (ANTIVERT) 12.5 MG tablet Take 1 tablet (12.5 mg total) by mouth 3 (three) times daily as needed for dizziness. 30 tablet 0  . metoprolol tartrate (LOPRESSOR) 25 MG tablet Take 1 tablet (25 mg total) by mouth 2 (two) times daily. 60 tablet 5  . Multiple Vitamin (MULTIVITAMIN) tablet Take 1 tablet by mouth daily.    . ondansetron (ZOFRAN ODT) 4 MG disintegrating tablet Take 1 tablet (4 mg total) by mouth every 8 (eight) hours as needed for nausea or vomiting. 20 tablet 0  . risedronate (ACTONEL) 35 MG tablet TAKE 1 TABLET BY MOUTH WEEKLY SAME DAY OF EACH WEEK. TAKE FIRST THINGON ARISING WITH A FULL GLASS OF WATER. STAYUP RIGHT FOR 30 MINUTES. 12 tablet 1   No current facility-administered medications on file prior to visit.     Social History  Substance Use Topics  . Smoking status: Never Smoker  . Smokeless tobacco: Never Used  . Alcohol use No    Review of Systems  Constitutional: Negative for chills and fever.  Respiratory: Negative for cough.   Cardiovascular: Negative for chest pain and palpitations.  Gastrointestinal: Negative for anal bleeding, blood in stool, nausea and vomiting.  Genitourinary: Positive for hematuria. Negative for dysuria, frequency,  urgency, vaginal bleeding, vaginal discharge and vaginal pain.      Objective:    BP 132/68   Pulse 73   Temp 98.1 F (36.7 C) (Oral)   Ht 5\' 4"  (1.626 m)   Wt 139 lb 9.6 oz (63.3 kg)   SpO2 98%   BMI 23.96 kg/m    Physical Exam  Constitutional: She appears well-developed and well-nourished.  Eyes: Conjunctivae are normal.  Cardiovascular: Normal rate, regular rhythm, normal heart sounds and normal pulses.   Pulmonary/Chest: Effort normal and breath sounds normal. She has no wheezes. She has no rhonchi. She has no rales.  Genitourinary: Rectal exam shows no external hemorrhoid and no internal hemorrhoid. There is no rash, tenderness, lesion or injury on the right labia. There is no  rash, tenderness, lesion or injury on the left labia. Right adnexum displays no tenderness. Left adnexum displays no tenderness. No tenderness or bleeding in the vagina. No vaginal discharge found.  Genitourinary Comments: Pap performed. No cervix visualized.   Neurological: She is alert.  Skin: Skin is warm and dry.  Psychiatric: She has a normal mood and affect. Her speech is normal and behavior is normal. Thought content normal.  Vitals reviewed.      Assessment & Plan:   1. Hematuria, unspecified type No blood seen in urine today. Pending micro. At this time, want to ensure we know where blood came from. No blood seen from rectum, vagina, urethra.  Advised close vigilance and to let me know if recurs.   - POCT Urinalysis Dipstick - Urine Culture - Urine Microscopic - Fecal occult blood, imunochemical; Future - Cytology - PAP  2. Vaginal bleeding Concern blood may be coming from vagina however none appreciated on exam.Pending pap of vaginal cavity.  - Cytology - PAP    I am having Ms. Westerhold maintain her multivitamin, aspirin, calcium-vitamin D, Vitamin D3, fluticasone, risedronate, meclizine, ondansetron, and metoprolol tartrate.   No orders of the defined types were placed in this encounter.   Return precautions given.   Risks, benefits, and alternatives of the medications and treatment plan prescribed today were discussed, and patient expressed understanding.   Education regarding symptom management and diagnosis given to patient on AVS.  Continue to follow with Crecencio Mc, MD for routine health maintenance.   Nonda Lou Mchatton and I agreed with plan.   Mable Paris, FNP

## 2016-11-01 LAB — CYTOLOGY - PAP
Diagnosis: NEGATIVE
HPV: NOT DETECTED

## 2016-11-04 ENCOUNTER — Other Ambulatory Visit: Payer: Medicare Other

## 2016-11-05 ENCOUNTER — Other Ambulatory Visit (INDEPENDENT_AMBULATORY_CARE_PROVIDER_SITE_OTHER): Payer: Medicare Other

## 2016-11-05 DIAGNOSIS — R319 Hematuria, unspecified: Secondary | ICD-10-CM

## 2016-11-05 LAB — FECAL OCCULT BLOOD, IMMUNOCHEMICAL: Fecal Occult Bld: NEGATIVE

## 2016-11-18 DIAGNOSIS — M722 Plantar fascial fibromatosis: Secondary | ICD-10-CM | POA: Diagnosis not present

## 2017-01-13 DIAGNOSIS — H6980 Other specified disorders of Eustachian tube, unspecified ear: Secondary | ICD-10-CM | POA: Diagnosis not present

## 2017-01-13 DIAGNOSIS — H9319 Tinnitus, unspecified ear: Secondary | ICD-10-CM | POA: Diagnosis not present

## 2017-01-13 DIAGNOSIS — H6121 Impacted cerumen, right ear: Secondary | ICD-10-CM | POA: Diagnosis not present

## 2017-01-27 ENCOUNTER — Other Ambulatory Visit: Payer: Self-pay

## 2017-01-27 MED ORDER — RISEDRONATE SODIUM 35 MG PO TABS: ORAL_TABLET | ORAL | 1 refills | Status: DC

## 2017-01-27 MED ORDER — METOPROLOL TARTRATE 25 MG PO TABS: 25.0000 mg | ORAL_TABLET | Freq: Two times a day (BID) | ORAL | 2 refills | Status: DC

## 2017-01-27 NOTE — Telephone Encounter (Signed)
Refilled: 07/26/2016 Last OV: 10/29/2016 Next OV: 07/03/2017

## 2017-03-04 ENCOUNTER — Ambulatory Visit: Payer: Self-pay

## 2017-03-04 ENCOUNTER — Other Ambulatory Visit: Payer: Self-pay

## 2017-03-04 MED ORDER — FLUTICASONE PROPIONATE 50 MCG/ACT NA SUSP: 2.0000 | Freq: Every day | NASAL | 0 refills | Status: DC

## 2017-03-04 NOTE — Telephone Encounter (Signed)
Appointment made with Ms. Arnett in February.

## 2017-03-04 NOTE — Telephone Encounter (Signed)
Pt. Feels like the dizziness is related to her ears. No availabilty in the office. Will call ENT. Reason for Disposition . [1] MODERATE dizziness (e.g., interferes with normal activities) AND [2] has NOT been evaluated by physician for this  (Exception: dizziness caused by heat exposure, sudden standing, or poor fluid intake)  Answer Assessment - Initial Assessment Questions 1. DESCRIPTION: "Describe your dizziness."     When gets up or stops to walk. 2. LIGHTHEADED: "Do you feel lightheaded?" (e.g., somewhat faint, woozy, weak upon standing)     Lightheaded 3. VERTIGO: "Do you feel like either you or the room is spinning or tilting?" (i.e. vertigo)     No 4. SEVERITY: "How bad is it?"  "Do you feel like you are going to faint?" "Can you stand and walk?"   - MILD - walking normally   - MODERATE - interferes with normal activities (e.g., work, school)    - SEVERE - unable to stand, requires support to walk, feels like passing out now.      Mild 5. ONSET:  "When did the dizziness begin?"     Started yesterday 6. AGGRAVATING FACTORS: "Does anything make it worse?" (e.g., standing, change in head position)     Walking 7. HEART RATE: "Can you tell me your heart rate?" "How many beats in 15 seconds?"  (Note: not all patients can do this)       It's Regular 8. CAUSE: "What do you think is causing the dizziness?"     My ears 9. RECURRENT SYMPTOM: "Have you had dizziness before?" If so, ask: "When was the last time?" "What happened that time?"     Yes 10. OTHER SYMPTOMS: "Do you have any other symptoms?" (e.g., fever, chest pain, vomiting, diarrhea, bleeding)       No 11. PREGNANCY: "Is there any chance you are pregnant?" "When was your last menstrual period?"       No  Protocols used: [facility]

## 2017-03-05 DIAGNOSIS — H6980 Other specified disorders of Eustachian tube, unspecified ear: Secondary | ICD-10-CM | POA: Diagnosis not present

## 2017-03-05 DIAGNOSIS — R42 Dizziness and giddiness: Secondary | ICD-10-CM | POA: Diagnosis not present

## 2017-03-17 ENCOUNTER — Ambulatory Visit (INDEPENDENT_AMBULATORY_CARE_PROVIDER_SITE_OTHER): Payer: Medicare Other | Admitting: Family

## 2017-03-17 ENCOUNTER — Encounter: Payer: Self-pay | Admitting: Family

## 2017-03-17 VITALS — BP 128/70 | HR 65 | Temp 98.4°F | Resp 16 | Wt 140.0 lb

## 2017-03-17 DIAGNOSIS — I10 Essential (primary) hypertension: Secondary | ICD-10-CM | POA: Diagnosis not present

## 2017-03-17 DIAGNOSIS — E785 Hyperlipidemia, unspecified: Secondary | ICD-10-CM | POA: Diagnosis not present

## 2017-03-17 NOTE — Progress Notes (Signed)
Subjective:    Patient ID: Tina Silva, female    DOB: 11-27-1934, 83 y.o.   MRN: [id number]  CC: Tina Silva is a 83 y.o. female who presents today for follow up.   HPI: Here for follow up for HTN. At home, 120/61, 77 HR. Feeling well today and tolerating lopressor.   SInce being last seen, notes she has not seen any blood in urine.   Also notes she has dizzy spells a couple of months, resolved since starting flonase. Suspect 'eustachian' tube.   Denies exertional chest pain or pressure, numbness or tingling radiating to left arm or jaw, palpitations, dizziness, frequent headaches, changes in vision, or shortness of breath.   Notes she was told LDL was high last year. Has improved diet ( less trans and saturated fats) and decreased salt. She declined crestor last year.   Mammogram UTD  Excercising- walking 2 miles at mall.     HISTORY:  Past Medical History:  Diagnosis Date  . Hyperlipidemia   . Hypertension   . Hypothyroidism   . Osteoporosis    Past Surgical History:  Procedure Laterality Date  . ABDOMINAL HYSTERECTOMY    . APPENDECTOMY  1974  . BREAST BIOPSY Left 11/04/2005   core  . COLONOSCOPY WITH PROPOFOL N/A 02/12/2016   Procedure: COLONOSCOPY WITH PROPOFOL;  Surgeon: Manya Silvas, MD;  Location: [facility] ENDOSCOPY;  Service: Endoscopy;  Laterality: N/A;  . EYE SURGERY  june 2013   cataract with lens,  dinglein left eye   . TONSILLECTOMY    . TONSILLECTOMY AND ADENOIDECTOMY     Family History  Problem Relation Age of Onset  . Parkinson's disease Mother   . Cancer Sister        AML  . Diabetes Sister   . Heart disease Father   . Diabetes Paternal Aunt   . Cancer Maternal Grandmother   . Breast cancer Neg Hx     Allergies: Ciprofloxacin; Doxycycline; Flagyl [metronidazole]; Iodine; and Sudafed [pseudoephedrine hcl] Current Outpatient Medications on File Prior to Visit  Medication Sig Dispense Refill  . aspirin 81 MG tablet Take 81 mg by mouth  daily.    . calcium-vitamin D (OSCAL WITH D) 500-200 MG-UNIT tablet Take 1 tablet by mouth.     . Cholecalciferol (VITAMIN D3) 1000 units CAPS Take 1 capsule by mouth.    . fluticasone (FLONASE) 50 MCG/ACT nasal spray Place 2 sprays into both nostrils daily. 16 g 0  . meclizine (ANTIVERT) 12.5 MG tablet Take 1 tablet (12.5 mg total) by mouth 3 (three) times daily as needed for dizziness. 30 tablet 0  . metoprolol tartrate (LOPRESSOR) 25 MG tablet Take 1 tablet (25 mg total) by mouth 2 (two) times daily. 60 tablet 2  . Multiple Vitamin (MULTIVITAMIN) tablet Take 1 tablet by mouth daily.    . ondansetron (ZOFRAN ODT) 4 MG disintegrating tablet Take 1 tablet (4 mg total) by mouth every 8 (eight) hours as needed for nausea or vomiting. 20 tablet 0  . risedronate (ACTONEL) 35 MG tablet TAKE 1 TABLET BY MOUTH WEEKLY SAME DAY OF EACH WEEK. TAKE FIRST THINGON ARISING WITH A FULL GLASS OF WATER. STAYUP RIGHT FOR 30 MINUTES. 12 tablet 1   No current facility-administered medications on file prior to visit.     Social History   Tobacco Use  . Smoking status: Never Smoker  . Smokeless tobacco: Never Used  Substance Use Topics  . Alcohol use: No  . Drug use: No  Review of Systems  Constitutional: Negative for chills and fever.  Respiratory: Negative for cough.   Cardiovascular: Negative for chest pain and palpitations.  Gastrointestinal: Negative for nausea and vomiting.  Genitourinary: Negative for dysuria, hematuria and vaginal bleeding.  Neurological: Negative for dizziness (resolved) and headaches.      Objective:    BP 128/70 (BP Location: Left Arm, Patient Position: Sitting, Cuff Size: Normal)   Pulse 65   Temp 98.4 F (36.9 C) (Oral)   Resp 16   Wt 140 lb (63.5 kg)   SpO2 95%   BMI 24.03 kg/m  BP Readings from Last 3 Encounters:  03/17/17 128/70  10/29/16 132/68  08/16/16 136/72   Wt Readings from Last 3 Encounters:  03/17/17 140 lb (63.5 kg)  10/29/16 139 lb 9.6 oz  (63.3 kg)  08/16/16 138 lb 9.6 oz (62.9 kg)    Physical Exam  Constitutional: She appears well-developed and well-nourished.  Eyes: Conjunctivae are normal.  Cardiovascular: Normal rate, regular rhythm, normal heart sounds and normal pulses.  Pulmonary/Chest: Effort normal and breath sounds normal. She has no wheezes. She has no rhonchi. She has no rales.  Neurological: She is alert.  Skin: Skin is warm and dry.  Psychiatric: She has a normal mood and affect. Her speech is normal and behavior is normal. Thought content normal.  Vitals reviewed.      Assessment & Plan:   Problem List Items Addressed This Visit      Cardiovascular and Mediastinum   Hypertension - Primary    At goal. Tolerating beta blocker; dizziness resolved. Will continue therapy      Relevant Orders   Basic metabolic panel     Other   Hyperlipidemia with target LDL less than 100    Discussed statin therapy again based on CVD risk. Patient would like to lipid panel again. Ordered. She returns for CPE with PCP this summer.       Relevant Orders   Lipid panel       I am having Nonda Lou. Crimi maintain her multivitamin, aspirin, calcium-vitamin D, Vitamin D3, meclizine, ondansetron, metoprolol tartrate, risedronate, and fluticasone.   No orders of the defined types were placed in this encounter.   Return precautions given.   Risks, benefits, and alternatives of the medications and treatment plan prescribed today were discussed, and patient expressed understanding.   Education regarding symptom management and diagnosis given to patient on AVS.  Continue to follow with Crecencio Mc, MD for routine health maintenance.   Nonda Lou Naas and I agreed with plan.   Mable Paris, FNP

## 2017-03-17 NOTE — Assessment & Plan Note (Signed)
At goal. Tolerating beta blocker; dizziness resolved. Will continue therapy

## 2017-03-17 NOTE — Assessment & Plan Note (Signed)
Discussed statin therapy again based on CVD risk. Patient would like to lipid panel again. Ordered. She returns for CPE with PCP this summer.

## 2017-03-17 NOTE — Patient Instructions (Signed)
Fasting labs  Such a pleasure seeing you again!   Managing Your Hypertension Hypertension is commonly called high blood pressure. This is when the force of your blood pressing against the walls of your arteries is too strong. Arteries are blood vessels that carry blood from your heart throughout your body. Hypertension forces the heart to work harder to pump blood, and may cause the arteries to become narrow or stiff. Having untreated or uncontrolled hypertension can cause heart attack, stroke, kidney disease, and other problems. What are blood pressure readings? A blood pressure reading consists of a higher number over a lower number. Ideally, your blood pressure should be below 120/80. The first ("top") number is called the systolic pressure. It is a measure of the pressure in your arteries as your heart beats. The second ("bottom") number is called the diastolic pressure. It is a measure of the pressure in your arteries as the heart relaxes. What does my blood pressure reading mean? Blood pressure is classified into four stages. Based on your blood pressure reading, your health care provider may use the following stages to determine what type of treatment you need, if any. Systolic pressure and diastolic pressure are measured in a unit called mm Hg. Normal  Systolic pressure: below 086.  Diastolic pressure: below 80. Elevated  Systolic pressure: 578-469.  Diastolic pressure: below 80. Hypertension stage 1  Systolic pressure: 629-528.  Diastolic pressure: 41-32. Hypertension stage 2  Systolic pressure: 440 or above.  Diastolic pressure: 90 or above. What health risks are associated with hypertension? Managing your hypertension is an important responsibility. Uncontrolled hypertension can lead to:  A heart attack.  A stroke.  A weakened blood vessel (aneurysm).  Heart failure.  Kidney damage.  Eye damage.  Metabolic syndrome.  Memory and concentration  problems.  What changes can I make to manage my hypertension? Hypertension can be managed by making lifestyle changes and possibly by taking medicines. Your health care provider will help you make a plan to bring your blood pressure within a normal range. Eating and drinking  Eat a diet that is high in fiber and potassium, and low in salt (sodium), added sugar, and fat. An example eating plan is called the DASH (Dietary Approaches to Stop Hypertension) diet. To eat this way: ? Eat plenty of fresh fruits and vegetables. Try to fill half of your plate at each meal with fruits and vegetables. ? Eat whole grains, such as whole wheat pasta, brown rice, or whole grain bread. Fill about one quarter of your plate with whole grains. ? Eat low-fat diary products. ? Avoid fatty cuts of meat, processed or cured meats, and poultry with skin. Fill about one quarter of your plate with lean proteins such as fish, chicken without skin, beans, eggs, and tofu. ? Avoid premade and processed foods. These tend to be higher in sodium, added sugar, and fat.  Reduce your daily sodium intake. Most people with hypertension should eat less than 1,500 mg of sodium a day.  Limit alcohol intake to no more than 1 drink a day for nonpregnant women and 2 drinks a day for men. One drink equals 12 oz of beer, 5 oz of wine, or 1 oz of hard liquor. Lifestyle  Work with your health care provider to maintain a healthy body weight, or to lose weight. Ask what an ideal weight is for you.  Get at least 30 minutes of exercise that causes your heart to beat faster (aerobic exercise) most days of the  week. Activities may include walking, swimming, or biking.  Include exercise to strengthen your muscles (resistance exercise), such as weight lifting, as part of your weekly exercise routine. Try to do these types of exercises for 30 minutes at least 3 days a week.  Do not use any products that contain nicotine or tobacco, such as  cigarettes and e-cigarettes. If you need help quitting, ask your health care provider.  Control any long-term (chronic) conditions you have, such as high cholesterol or diabetes. Monitoring  Monitor your blood pressure at home as told by your health care provider. Your personal target blood pressure may vary depending on your medical conditions, your age, and other factors.  Have your blood pressure checked regularly, as often as told by your health care provider. Working with your health care provider  Review all the medicines you take with your health care provider because there may be side effects or interactions.  Talk with your health care provider about your diet, exercise habits, and other lifestyle factors that may be contributing to hypertension.  Visit your health care provider regularly. Your health care provider can help you create and adjust your plan for managing hypertension. Will I need medicine to control my blood pressure? Your health care provider may prescribe medicine if lifestyle changes are not enough to get your blood pressure under control, and if:  Your systolic blood pressure is 130 or higher.  Your diastolic blood pressure is 80 or higher.  Take medicines only as told by your health care provider. Follow the directions carefully. Blood pressure medicines must be taken as prescribed. The medicine does not work as well when you skip doses. Skipping doses also puts you at risk for problems. Contact a health care provider if:  You think you are having a reaction to medicines you have taken.  You have repeated (recurrent) headaches.  You feel dizzy.  You have swelling in your ankles.  You have trouble with your vision. Get help right away if:  You develop a severe headache or confusion.  You have unusual weakness or numbness, or you feel faint.  You have severe pain in your chest or abdomen.  You vomit repeatedly.  You have trouble  breathing. Summary  Hypertension is when the force of blood pumping through your arteries is too strong. If this condition is not controlled, it may put you at risk for serious complications.  Your personal target blood pressure may vary depending on your medical conditions, your age, and other factors. For most people, a normal blood pressure is less than 120/80.  Hypertension is managed by lifestyle changes, medicines, or both. Lifestyle changes include weight loss, eating a healthy, low-sodium diet, exercising more, and limiting alcohol. This information is not intended to replace advice given to you by your health care provider. Make sure you discuss any questions you have with your health care provider. Document Released: 10/09/2011 Document Revised: 12/13/2015 Document Reviewed: 12/13/2015 Elsevier Interactive Patient Education  [employer].

## 2017-03-18 ENCOUNTER — Other Ambulatory Visit (INDEPENDENT_AMBULATORY_CARE_PROVIDER_SITE_OTHER): Payer: Medicare Other

## 2017-03-18 DIAGNOSIS — I10 Essential (primary) hypertension: Secondary | ICD-10-CM

## 2017-03-18 DIAGNOSIS — E785 Hyperlipidemia, unspecified: Secondary | ICD-10-CM

## 2017-03-18 LAB — LIPID PANEL
CHOLESTEROL: 153 mg/dL (ref 0–200)
HDL: 39.3 mg/dL (ref >39.00)
LDL CALC: 81 mg/dL (ref 0–99)
NonHDL: 113.93
TRIGLYCERIDES: 163 mg/dL — AB (ref 0.0–149.0)
Total CHOL/HDL Ratio: 4
VLDL: 32.6 mg/dL (ref 0.0–40.0)

## 2017-03-18 LAB — BASIC METABOLIC PANEL
BUN: 22 mg/dL (ref 6–23)
CALCIUM: 9.5 mg/dL (ref 8.4–10.5)
CO2: 34 meq/L — AB (ref 19–32)
Chloride: 102 mEq/L (ref 96–112)
Creatinine, Ser: 0.97 mg/dL (ref 0.40–1.20)
GFR: 58.7 mL/min — AB (ref >60.00)
GLUCOSE: 96 mg/dL (ref 70–99)
Potassium: 4.7 mEq/L (ref 3.5–5.1)
SODIUM: 139 meq/L (ref 135–145)

## 2017-03-19 ENCOUNTER — Telehealth: Payer: Self-pay | Admitting: Internal Medicine

## 2017-03-19 DIAGNOSIS — E785 Hyperlipidemia, unspecified: Secondary | ICD-10-CM

## 2017-03-19 NOTE — Telephone Encounter (Signed)
Pt dropped off a paper in regards to a medication..she saw Arnett on 2/18 and requested this  Placed in Arnetts folder upfront

## 2017-03-20 MED ORDER — PRAVASTATIN SODIUM 20 MG PO TABS: 20.0000 mg | ORAL_TABLET | Freq: Every day | ORAL | 3 refills | Status: DC

## 2017-03-20 NOTE — Telephone Encounter (Signed)
Placed in your folder for review. 

## 2017-03-20 NOTE — Telephone Encounter (Signed)
Please advise 

## 2017-03-20 NOTE — Telephone Encounter (Signed)
Medication pravastaitn (safeer in older people) sent.  rtc for labs nonfasting in 3 weeks

## 2017-03-20 NOTE — Telephone Encounter (Signed)
Is it okay to send in rx? Simvastatin.

## 2017-03-20 NOTE — Telephone Encounter (Signed)
Pt requesting simvastin. Please advise.

## 2017-03-20 NOTE — Telephone Encounter (Signed)
Copied from [place] [id number]. Topic: General - Other >> Mar 20, 2017  3:15 PM Neva Seat wrote: Simvastatin  Pt was advised to start taking the Rx above.  Pt is now wanting this Rx to be called in to be filled since the diet didn't work for pt.  [facility] - [place], [place] - [address] [place] [address] Phone: [phone] Fax: [phone]

## 2017-03-21 NOTE — Telephone Encounter (Signed)
Spoke with pt and informed her of the medication that Dr. Derrel Nip called in for cholesterol. The pt gave a verbal understanding and stated that she has already picked up the rx. Pt has been scheduled for a 3 week lab appt and is aware of the appt date and time.

## 2017-03-27 ENCOUNTER — Ambulatory Visit: Payer: Medicare Other

## 2017-04-01 ENCOUNTER — Ambulatory Visit (INDEPENDENT_AMBULATORY_CARE_PROVIDER_SITE_OTHER): Payer: Medicare Other

## 2017-04-01 VITALS — BP 106/64 | HR 61 | Temp 98.4°F | Resp 14 | Ht 63.5 in | Wt 138.0 lb

## 2017-04-01 DIAGNOSIS — Z0000 Encounter for general adult medical examination without abnormal findings: Secondary | ICD-10-CM | POA: Diagnosis not present

## 2017-04-01 NOTE — Patient Instructions (Addendum)
  Tina Silva , Thank you for taking time to come for your Medicare Wellness Visit. I appreciate your ongoing commitment to your health goals. Please review the following plan we discussed and let me know if I can assist you in the future.   Follow up with Dr. Derrel Nip as needed.    Have a great day!  These are the goals we discussed: Goals    . Increase physical activity     Walk [place] at the mall for 2 miles       This is a list of the screening recommended for you and due dates:  Health Maintenance  Topic Date Due  . Tetanus Vaccine  12/29/2021  . Flu Shot  Completed  . DEXA scan (bone density measurement)  Completed  . Pneumonia vaccines  Completed

## 2017-04-01 NOTE — Progress Notes (Signed)
Subjective:   Tina Silva is a 83 y.o. female who presents for Medicare Annual (Subsequent) preventive examination.  Review of Systems:  No ROS.  Medicare Wellness Visit. Additional risk factors are reflected in the social history. Cardiac Risk Factors include: advanced age (>63men, >82 women);hypertension     Objective:     Vitals: BP 106/64 (BP Location: Left Arm, Patient Position: Sitting, Cuff Size: Normal)   Pulse 61   Temp 98.4 F (36.9 C) (Oral)   Resp 14   Ht 5' 3.5" (1.613 m)   Wt 138 lb (62.6 kg)   SpO2 98%   BMI 24.06 kg/m   Body mass index is 24.06 kg/m.  Advanced Directives 04/01/2017 07/28/2016 07/28/2016 07/03/2016 03/26/2016 03/14/2015 06/26/2014  Does Patient Have a Medical Advance Directive? Yes Yes No Yes Yes Yes No  Type of Paramedic of [place];Living will [place];Living will - [place];Living will [place];Living will [place];Living will -  Does patient want to make changes to medical advance directive? No - Patient declined No - Patient declined - No - Patient declined No - Patient declined No - Patient declined -  Copy of [place] in Chart? No - copy requested No - copy requested - - Yes No - copy requested -  Would patient like information on creating a medical advance directive? - No - Patient declined No - Patient declined - - - Yes - Scientist, clinical (histocompatibility and immunogenetics) given    Tobacco Social History   Tobacco Use  Smoking Status Never Smoker  Smokeless Tobacco Never Used     Counseling given: Not Answered   Clinical Intake:  Pre-visit preparation completed: Yes  Pain : No/denies pain     Nutritional Status: BMI of 19-24  Normal Diabetes: No  How often do you need to have someone help you when you read instructions, pamphlets, or other written materials from your doctor or pharmacy?: 1 - Never  Interpreter Needed?: No      Past Medical History:  Diagnosis Date  . Hyperlipidemia   . Hypertension   . Hypothyroidism   . Osteoporosis    Past Surgical History:  Procedure Laterality Date  . ABDOMINAL HYSTERECTOMY    . APPENDECTOMY  1974  . BREAST BIOPSY Left 11/04/2005   core  . COLONOSCOPY WITH PROPOFOL N/A 02/12/2016   Procedure: COLONOSCOPY WITH PROPOFOL;  Surgeon: Manya Silvas, MD;  Location: [facility] END[facility]COPY;  Service: Endoscopy;  Laterality: N/A;  . EYE SURGERY  june 2013   cataract with lens,  dinglein left eye   . TONSILLECTOMY    . TONSILLECTOMY AND ADENOIDECTOMY     Family History  Problem Relation Age of Onset  . Parkinson's disease Mother   . Cancer Sister        AML  . Diabetes Sister   . Heart disease Father   . Diabetes Paternal Aunt   . Cancer Maternal Grandmother   . Breast cancer Neg Hx    Social History   Socioeconomic History  . Marital status: Single    Spouse name: None  . Number of children: None  . Years of education: None  . Highest education level: None  Social Needs  . Financial resource strain: None  . Food insecurity - worry: Never true  . Food insecurity - inability: Never true  . Transportation needs - medical: No  . Transportation needs - non-medical: No  Occupational History  .  None  Tobacco Use  . Smoking status: Never Smoker  . Smokeless tobacco: Never Used  Substance and Sexual Activity  . Alcohol use: No  . Drug use: No  . Sexual activity: No  Other Topics Concern  . None  Social History Narrative   Independent at baseline    Outpatient Encounter Medications as of 04/01/2017  Medication Sig  . aspirin 81 MG tablet Take 81 mg by mouth daily.  . calcium-vitamin D (OSCAL WITH D) 500-200 MG-UNIT tablet Take 1 tablet by mouth.   . Cholecalciferol (VITAMIN D3) 1000 units CAPS Take 1 capsule by mouth.  . fluticasone (FLONASE) 50 MCG/ACT nasal spray Place 2 sprays into both nostrils daily.  . metoprolol tartrate (LOPRESSOR) 25 MG tablet Take 1  tablet (25 mg total) by mouth 2 (two) times daily.  . Multiple Vitamin (MULTIVITAMIN) tablet Take 1 tablet by mouth daily.  . pravastatin (PRAVACHOL) 20 MG tablet Take 1 tablet (20 mg total) by mouth daily.  . risedronate (ACTONEL) 35 MG tablet TAKE 1 TABLET BY MOUTH WEEKLY SAME DAY OF EACH WEEK. TAKE FIRST THINGON ARISING WITH A FULL GLASS OF WATER. STAYUP RIGHT FOR 30 MINUTES.  Marland Kitchen meclizine (ANTIVERT) 12.5 MG tablet Take 1 tablet (12.5 mg total) by mouth 3 (three) times daily as needed for dizziness. (Patient not taking: Reported on 04/01/2017)  . ondansetron (ZOFRAN ODT) 4 MG disintegrating tablet Take 1 tablet (4 mg total) by mouth every 8 (eight) hours as needed for nausea or vomiting. (Patient not taking: Reported on 04/01/2017)   No facility-administered encounter medications on file as of 04/01/2017.     Activities of Daily Living In your present state of health, do you have any difficulty performing the following activities: 04/01/2017 07/28/2016  Hearing? N N  Vision? N N  Difficulty concentrating or making decisions? N Y  Walking or climbing stairs? N N  Dressing or bathing? N N  Doing errands, shopping? N N  Preparing Food and eating ? N -  Using the Toilet? N -  In the past six months, have you accidently leaked urine? N -  Do you have problems with loss of bowel control? N -  Managing your Medications? N -  Managing your Finances? N -  Housekeeping or managing your Housekeeping? N -  Some recent data might be hidden    Patient Care Team: Crecencio Mc, MD as PCP - General (Internal Medicine)    Assessment:   This is a routine wellness examination for Tina Silva.  The goal of the wellness visit is to assist the patient how to close the gaps in care and create a preventative care plan for the patient.   The roster of all physicians providing medical care to patient is listed in the Snapshot section of the chart.  Taking Risedronate as appropriate/Osteoporosis reviewed.    Safety  issues reviewed; Lives alone.  Smoke and carbon monoxide detectors in the home. No firearms or firearms locked in a safe within the home. Wears seatbelts when driving or riding with others. No violence in the home.  They do not have excessive sun exposure.  Discussed the need for sun protection: hats, long sleeves and the use of sunscreen if there is significant sun exposure.  Patient is alert, normal appearance, oriented to person/place/and time.  Correctly identified the president of the [place] and recalls of 3/3 words. Performs simple calculations and can read correct time from watch face.  Displays appropriate judgement.  No new identified risk  were noted.  No failures at ADL's or IADL's.    BMI- discussed the importance of a healthy diet, water intake and the benefits of aerobic exercise. Educational material provided.   24 hour diet recall: Low cholesterol diet  Daily fluid intake: 0 cups of caffeine, 8 cups of water, 1 cups of juice.  Dental- every 6 months.  Eye- Visual acuity not assessed per patient preference since they have regular follow up with the ophthalmologist.  Wears corrective lenses.  Sleep patterns- Sleeps 8 hours at night.  Wakes feeling rested.  Health maintenance gaps- closed.  Patient Concerns: None at this time. Follow up with PCP as needed.  Exercise Activities and Dietary recommendations Current Exercise Habits: Home exercise routine, Type of exercise: walking, Time (Minutes): 40, Frequency (Times/Week): 3, Weekly Exercise (Minutes/Week): 120, Intensity: Moderate  Goals    . Increase physical activity     Walk daily at the mall for 2 miles       Fall Risk Fall Risk  04/01/2017 10/29/2016 07/03/2016 03/26/2016 04/07/2015  Falls in the past year? No No No No No   Depression Screen PHQ 2/9 Scores 04/01/2017 10/29/2016 07/03/2016 03/26/2016  PHQ - 2 Score 0 0 0 0     Cognitive Function MMSE - Mini Mental State Exam 04/01/2017 03/26/2016 03/14/2015    Orientation to time 5 5 5   Orientation to Place 5 5 5   Registration 3 3 3   Attention/ Calculation 5 5 5   Recall 3 3 3   Language- name 2 objects 2 2 2   Language- repeat 1 1 1   Language- follow 3 step command 3 3 3   Language- read & follow direction 1 1 1   Write a sentence 1 1 1   Copy design 1 1 1   Total score 30 30 30         Immunization History  Administered Date(s) Administered  . Influenza Whole 10/22/2011  . Influenza, High Dose Seasonal PF 10/18/2016  . Influenza-Unspecified 11/05/2012, 10/11/2013, 10/06/2014, 10/15/2015  . Pneumococcal Conjugate-13 10/12/2013  . Pneumococcal Polysaccharide-23 07/15/2012  . Tdap 12/30/2011  . Zoster 07/22/2011    Screening Tests Health Maintenance  Topic Date Due  . TETANUS/TDAP  12/29/2021  . INFLUENZA VACCINE  Completed  . DEXA SCAN  Completed  . PNA vac Low Risk Adult  Completed       Plan:    End of life planning; Advance aging; Advanced directives discussed. Copy of current HCPOA/Living Will on file.    I have personally reviewed and noted the following in the patient's chart:   . Medical and social history . Use of alcohol, tobacco or illicit drugs  . Current medications and supplements . Functional ability and status . Nutritional status . Physical activity . Advanced directives . List of other physicians . Hospitalizations, surgeries, and ER visits in previous 12 months . Vitals . Screenings to include cognitive, depression, and falls . Referrals and appointments  In addition, I have reviewed and discussed with patient certain preventive protocols, quality metrics, and best practice recommendations. A written personalized care plan for preventive services as well as general preventive health recommendations were provided to patient.     Varney Biles, LPN  04/01/3297   Reviewed above information.  Agree with assessment and plan.    Dr Nicki Reaper

## 2017-04-10 ENCOUNTER — Other Ambulatory Visit: Payer: Self-pay | Admitting: Internal Medicine

## 2017-04-11 ENCOUNTER — Other Ambulatory Visit (INDEPENDENT_AMBULATORY_CARE_PROVIDER_SITE_OTHER): Payer: Medicare Other

## 2017-04-11 DIAGNOSIS — E785 Hyperlipidemia, unspecified: Secondary | ICD-10-CM

## 2017-04-11 LAB — COMPREHENSIVE METABOLIC PANEL
ALT: 30 U/L (ref 0–35)
AST: 20 U/L (ref 0–37)
Albumin: 3.9 g/dL (ref 3.5–5.2)
Alkaline Phosphatase: 59 U/L (ref 39–117)
BILIRUBIN TOTAL: 0.4 mg/dL (ref 0.2–1.2)
BUN: 25 mg/dL — AB (ref 6–23)
CALCIUM: 9.6 mg/dL (ref 8.4–10.5)
CO2: 35 meq/L — AB (ref 19–32)
CREATININE: 0.92 mg/dL (ref 0.40–1.20)
Chloride: 104 mEq/L (ref 96–112)
GFR: 62.38 mL/min (ref >60.00)
GLUCOSE: 106 mg/dL — AB (ref 70–99)
Potassium: 5.3 mEq/L — ABNORMAL HIGH (ref 3.5–5.1)
SODIUM: 141 meq/L (ref 135–145)
Total Protein: 6.2 g/dL (ref 6.0–8.3)

## 2017-04-17 ENCOUNTER — Other Ambulatory Visit: Payer: Self-pay | Admitting: Internal Medicine

## 2017-04-17 DIAGNOSIS — Z1231 Encounter for screening mammogram for malignant neoplasm of breast: Secondary | ICD-10-CM

## 2017-05-13 ENCOUNTER — Other Ambulatory Visit: Payer: Self-pay | Admitting: Internal Medicine

## 2017-06-02 ENCOUNTER — Ambulatory Visit
Admission: RE | Admit: 2017-06-02 | Discharge: 2017-06-02 | Disposition: A | Discharge status: Home / Self Care | Payer: Medicare Other | Source: Ambulatory Visit | Attending: Internal Medicine | Admitting: Internal Medicine

## 2017-06-02 DIAGNOSIS — Z1231 Encounter for screening mammogram for malignant neoplasm of breast: Secondary | ICD-10-CM | POA: Diagnosis not present

## 2017-07-03 ENCOUNTER — Ambulatory Visit (INDEPENDENT_AMBULATORY_CARE_PROVIDER_SITE_OTHER): Payer: Medicare Other | Admitting: Internal Medicine

## 2017-07-03 ENCOUNTER — Encounter: Payer: Self-pay | Admitting: Internal Medicine

## 2017-07-03 VITALS — BP 118/64 | HR 67 | Temp 98.2°F | Resp 14 | Ht 63.5 in | Wt 140.0 lb

## 2017-07-03 DIAGNOSIS — K219 Gastro-esophageal reflux disease without esophagitis: Secondary | ICD-10-CM

## 2017-07-03 DIAGNOSIS — I10 Essential (primary) hypertension: Secondary | ICD-10-CM

## 2017-07-03 DIAGNOSIS — Z1239 Encounter for other screening for malignant neoplasm of breast: Secondary | ICD-10-CM

## 2017-07-03 DIAGNOSIS — M810 Age-related osteoporosis without current pathological fracture: Secondary | ICD-10-CM | POA: Diagnosis not present

## 2017-07-03 DIAGNOSIS — R5383 Other fatigue: Secondary | ICD-10-CM | POA: Diagnosis not present

## 2017-07-03 DIAGNOSIS — Z1231 Encounter for screening mammogram for malignant neoplasm of breast: Secondary | ICD-10-CM

## 2017-07-03 DIAGNOSIS — E785 Hyperlipidemia, unspecified: Secondary | ICD-10-CM | POA: Diagnosis not present

## 2017-07-03 DIAGNOSIS — R739 Hyperglycemia, unspecified: Secondary | ICD-10-CM | POA: Diagnosis not present

## 2017-07-03 LAB — COMPREHENSIVE METABOLIC PANEL
ALT: 21 U/L (ref 0–35)
AST: 19 U/L (ref 0–37)
Albumin: 4.1 g/dL (ref 3.5–5.2)
Alkaline Phosphatase: 57 U/L (ref 39–117)
BUN: 24 mg/dL — AB (ref 6–23)
CHLORIDE: 102 meq/L (ref 96–112)
CO2: 33 mEq/L — ABNORMAL HIGH (ref 19–32)
Calcium: 9.6 mg/dL (ref 8.4–10.5)
Creatinine, Ser: 0.96 mg/dL (ref 0.40–1.20)
GFR: 59.36 mL/min — ABNORMAL LOW (ref >60.00)
GLUCOSE: 95 mg/dL (ref 70–99)
POTASSIUM: 4.9 meq/L (ref 3.5–5.1)
SODIUM: 140 meq/L (ref 135–145)
TOTAL PROTEIN: 6.4 g/dL (ref 6.0–8.3)
Total Bilirubin: 0.5 mg/dL (ref 0.2–1.2)

## 2017-07-03 LAB — CBC WITH DIFFERENTIAL/PLATELET
BASOS ABS: 0 10*3/uL (ref 0.0–0.1)
Basophils Relative: 0.5 % (ref 0.0–3.0)
EOS PCT: 1.5 % (ref 0.0–5.0)
Eosinophils Absolute: 0.1 10*3/uL (ref 0.0–0.7)
HCT: 40.8 % (ref 36.0–46.0)
Hemoglobin: 14 g/dL (ref 12.0–15.0)
LYMPHS ABS: 1.2 10*3/uL (ref 0.7–4.0)
Lymphocytes Relative: 23.2 % (ref 12.0–46.0)
MCHC: 34.2 g/dL (ref 30.0–36.0)
MCV: 97.9 fl (ref 78.0–100.0)
MONO ABS: 0.3 10*3/uL (ref 0.1–1.0)
MONOS PCT: 6.1 % (ref 3.0–12.0)
NEUTROS ABS: 3.6 10*3/uL (ref 1.4–7.7)
Neutrophils Relative %: 68.7 % (ref 43.0–77.0)
PLATELETS: 144 10*3/uL — AB (ref 150.0–400.0)
RBC: 4.17 Mil/uL (ref 3.87–5.11)
RDW: 12.2 % (ref 11.5–15.5)
WBC: 5.2 10*3/uL (ref 4.0–10.5)

## 2017-07-03 LAB — TSH: TSH: 2.45 u[IU]/mL (ref 0.35–4.50)

## 2017-07-03 LAB — LIPID PANEL
Cholesterol: 139 mg/dL (ref 0–200)
HDL: 41.1 mg/dL (ref >39.00)
LDL CALC: 59 mg/dL (ref 0–99)
NONHDL: 98.31
Total CHOL/HDL Ratio: 3
Triglycerides: 199 mg/dL — ABNORMAL HIGH (ref 0.0–149.0)
VLDL: 39.8 mg/dL (ref 0.0–40.0)

## 2017-07-03 LAB — HEMOGLOBIN A1C: HEMOGLOBIN A1C: 5.2 % (ref 4.6–6.5)

## 2017-07-03 MED ORDER — FLUTICASONE PROPIONATE 50 MCG/ACT NA SUSP: 2.0000 | Freq: Every day | NASAL | 5 refills | Status: DC

## 2017-07-03 MED ORDER — ZOSTER VAC RECOMB ADJUVANTED 50 MCG/0.5ML IM SUSR: 0.5000 mL | Freq: Once | INTRAMUSCULAR | 1 refills | Status: AC

## 2017-07-03 MED ORDER — RISEDRONATE SODIUM 35 MG PO TABS: ORAL_TABLET | ORAL | 3 refills | Status: DC

## 2017-07-03 NOTE — Patient Instructions (Addendum)
You can try suspending the metoprolol because your blood pressure is now low enough to try going without it.   If your resting  pulse without the metoprolol stays above 100 after a week,  We should resume metoprolol    You can stop your daily aspirin .  You do not have heart disease so the risk of bleeding outweighs the benefit.    The ShingRx vaccine is now available in local pharmacies and is much more protective thant Zostavaxs,  It is therefore ADVISED for all interested adults over 50 to prevent shingles

## 2017-07-03 NOTE — Progress Notes (Signed)
Patient ID: Jemimah Cressy Regal, female    DOB: [birth year]  Age: 83 y.o. MRN: [id number]  The patient is here for follow up and management of hyperlipidemia, hypertension and IPG  Along with other  chronic and acute problems.  Last seen in July, last CMET march   Last EXA 2015  On [place]     The risk factors are reflected in the social history.  The roster of all physicians providing medical care to patient - is listed in the Snapshot section of the chart.  Activities of daily living:  The patient is 100% independent in all ADLs: dressing, toileting, feeding as well as independent mobility  Home safety : The patient has smoke detectors in the home. They wear seatbelts.  There are no firearms at home. There is no violence in the home.   There is no risks for hepatitis, STDs or HIV. There is no   history of blood transfusion. They have no travel history to infectious disease endemic areas of the world.  The patient has seen their dentist in the last six month. They have seen their eye doctor in the last year. They admit to slight hearing difficulty with regard to whispered voices and some television programs.  They have deferred audiologic testing in the last year.  They do not  have excessive sun exposure. Discussed the need for sun protection: hats, long sleeves and use of sunscreen if there is significant sun exposure.   Diet: the importance of a healthy diet is discussed. They do have a healthy diet.  The benefits of regular aerobic exercise were discussed. She walks 5 times per week ,  30 minutes. And particpiates in an aerobics class for  For seniors  3 times per week    Depression screen: there are no signs or vegative symptoms of depression- irritability, change in appetite, anhedonia, sadness/tearfullness.  Cognitive assessment: the patient manages all their financial and personal affairs and is actively engaged. They could relate day,date,year and events; recalled 2/3 objects at 3  minutes; performed clock-face test normally.  The following portions of the patient's history were reviewed and updated as appropriate: allergies, current medications, past family history, past medical history,  past surgical history, past social history  and problem list.  Visual acuity was not assessed per patient preference since she hasa July  follow up with her ophthalmologist. Hearing and body mass index were assessed and reviewed.   During the course of the visit the patient was educated and counseled about appropriate screening and preventive services including : fall prevention , diabetes screening, nutrition counseling, colorectal cancer screening, and recommended immunizations.    CC: The primary encounter diagnosis was Hyperglycemia. Diagnoses of Hypocalcemia, Hyperlipidemia with target LDL less than 100, Fatigue, unspecified type, Gastroesophageal reflux disease without esophagitis, Essential hypertension, Osteoporosis, post-menopausal, and Breast cancer screening were also pertinent to this visit.  Some right shoulder popping with weights,  Some neck crepitus  Patient is taking her medications as prescribed and notes no adverse effects.  Home BP readings have been done about once per week and are  generally < 130/80 .  She is avoiding added salt in her diet and walking regularly about 3 times per week for exercise  .  History Selam has a past medical history of Hyperlipidemia, Hypertension, Hypothyroidism, and Osteoporosis.   She has a past surgical history that includes Abdominal hysterectomy; Appendectomy (1974); Tonsillectomy and adenoidectomy; Eye surgery (june 2013); Tonsillectomy; Colonoscopy with propofol (N/A, 02/12/2016); and Breast biopsy (  Left, 11/04/2005).   Her family history includes Cancer in her maternal grandmother and sister; Diabetes in her paternal aunt and sister; Heart disease in her father; Parkinson's disease in her mother.She reports that she has never smoked.  She has never used smokeless tobacco. She reports that she does not drink alcohol or use drugs.  Outpatient Medications Prior to Visit  Medication Sig Dispense Refill  . calcium-vitamin D (OSCAL WITH D) 500-200 MG-UNIT tablet Take 1 tablet by mouth.     . Cholecalciferol (VITAMIN D3) 1000 units CAPS Take 1 capsule by mouth.    . Multiple Vitamin (MULTIVITAMIN) tablet Take 1 tablet by mouth daily.    . pravastatin (PRAVACHOL) 20 MG tablet Take 1 tablet (20 mg total) by mouth daily. 90 tablet 3  . aspirin 81 MG tablet Take 81 mg by mouth daily.    . fluticasone (FLONASE) 50 MCG/ACT nasal spray Place 2 sprays into both nostrils daily. 16 g 0  . metoprolol tartrate (LOPRESSOR) 25 MG tablet TAKE ONE TABLET BY MOUTH TWICE DAILY 60 tablet 2  . risedronate (ACTONEL) 35 MG tablet TAKE 1 TABLET BY MOUTH WEEKLY SAME DAY OF EACH WEEK. TAKE FIRST THINGON ARISING WITH A FULL GLASS OF WATER. STAYUP RIGHT FOR 30 MINUTES. 12 tablet 1  . meclizine (ANTIVERT) 12.5 MG tablet Take 1 tablet (12.5 mg total) by mouth 3 (three) times daily as needed for dizziness. (Patient not taking: Reported on 04/01/2017) 30 tablet 0  . metoprolol tartrate (LOPRESSOR) 25 MG tablet TAKE ONE TABLET BY MOUTH TWICE DAILY (Patient not taking: Reported on 07/03/2017) 180 tablet 1  . ondansetron (ZOFRAN ODT) 4 MG disintegrating tablet Take 1 tablet (4 mg total) by mouth every 8 (eight) hours as needed for nausea or vomiting. (Patient not taking: Reported on 04/01/2017) 20 tablet 0   No facility-administered medications prior to visit.     Review of Systems   Patient denies headache, fevers, malaise, unintentional weight loss, skin rash, eye pain, sinus congestion and sinus pain, sore throat, dysphagia,  hemoptysis , cough, dyspnea, wheezing, chest pain, palpitations, orthopnea, edema, abdominal pain, nausea, melena, diarrhea, constipation, flank pain, dysuria, hematuria, urinary  Frequency, nocturia, numbness, tingling, seizures,  Focal weakness,  Loss of consciousness,  Tremor, insomnia, depression, anxiety, and suicidal ideation.      Objective:  BP 118/64 (BP Location: Left Arm, Patient Position: Sitting, Cuff Size: Normal)   Pulse 67   Temp 98.2 F (36.8 C) (Oral)   Resp 14   Ht 5' 3.5" (1.613 m)   Wt 140 lb (63.5 kg)   SpO2 96%   BMI 24.41 kg/m   Physical Exam   General appearance: alert, cooperative and appears stated age Head: Normocephalic, without obvious abnormality, atraumatic Eyes: conjunctivae/corneas clear. PERRL, EOM's intact. Fundi benign. Ears: normal TM's and external ear canals both ears Nose: Nares normal. Septum midline. Mucosa normal. No drainage or sinus tenderness. Throat: lips, mucosa, and tongue normal; teeth and gums normal Neck: no adenopathy, no carotid bruit, no JVD, supple, symmetrical, trachea midline and thyroid not enlarged, symmetric, no tenderness/mass/nodules Lungs: clear to auscultation bilaterally Breasts: normal appearance, no masses or tenderness Heart: regular rate and rhythm, S1, S2 normal, no murmur, click, rub or gallop Abdomen: soft, non-tender; bowel sounds normal; no masses,  no organomegaly Extremities: extremities normal, atraumatic, no cyanosis or edema Pulses: 2+ and symmetric Skin: Skin color, texture, turgor normal. No rashes or lesions Neurologic: Alert and oriented X 3, normal strength and tone. Normal symmetric reflexes. Normal  coordination and gait.      Assessment & Plan:   Problem List Items Addressed This Visit    Hyperglycemia - Primary   Relevant Orders   Hemoglobin A1c (Completed)   Osteoporosis, post-menopausal    treated with alendronate initially,  followed by change to risedronate.  History of  remote wrist fracture. Last DEXA 2015  t socres -2.1 but fracture risk very high at 37%        Relevant Medications   risedronate (ACTONEL) 35 MG tablet   Hypocalcemia    Rsolved,  Had Occurred in in the setting of vertigo and vomiting with decreased  GFR from volume loss.    Lab Results  Component Value Date   CALCIUM 9.6 07/03/2017         Relevant Orders   Comprehensive metabolic panel (Completed)   Hypertension    Well controlled on current regimen. Stopping metoprolol as a trial. Renal function stable, no changes today.  Lab Results  Component Value Date   CREATININE 0.96 07/03/2017   Lab Results  Component Value Date   NA 140 07/03/2017   K 4.9 07/03/2017   CL 102 07/03/2017   CO2 33 (H) 07/03/2017         Hyperlipidemia with target LDL less than 100    Lipids are at goal  On pravastatin   No  change in statin therapy  is advised given her age .  Aspirin therapy discontinued given results of ASPREE trial   Lab Results  Component Value Date   CHOL 139 07/03/2017   HDL 41.10 07/03/2017   LDLCALC 59 07/03/2017   TRIG 199.0 (H) 07/03/2017   CHOLHDL 3 07/03/2017         Relevant Orders   Lipid panel (Completed)   Breast cancer screening    Mammogram normal may 2019      Acid reflux    Occurring infrequently Discussed current controversy regarding prolonged use of PPI in patients without documented Barretts esophagus.  Patient has no prior EGD but has been on PPI therapy for > 5 years (per patient).  Suggested trial of pepcid 20 mg daily.  If GERD symptoms return,  advised her to accept referral for EGD.         Other Visit Diagnoses    Fatigue, unspecified type       Relevant Orders   TSH (Completed)   CBC with Differential/Platelet (Completed)      I have discontinued Nonda Lou. Grimmer's aspirin, meclizine, ondansetron, metoprolol tartrate, and metoprolol tartrate. I am also having her start on Zoster Vaccine Adjuvanted. Additionally, I am having her maintain her multivitamin, calcium-vitamin D, Vitamin D3, pravastatin, fluticasone, and risedronate.  Meds ordered this encounter  Medications  . Zoster Vaccine Adjuvanted [facility]) injection    Sig: Inject 0.5 mLs into the muscle once for 1 dose.     Dispense:  1 each    Refill:  1  . fluticasone (FLONASE) 50 MCG/ACT nasal spray    Sig: Place 2 sprays into both nostrils daily.    Dispense:  16 g    Refill:  5  . risedronate (ACTONEL) 35 MG tablet    Sig: TAKE 1 TABLET BY MOUTH WEEKLY SAME DAY OF EACH WEEK. TAKE FIRST THINGON ARISING WITH A FULL GLASS OF WATER. STAYUP RIGHT FOR 30 MINUTES.    Dispense:  12 tablet    Refill:  3  A total of 40 minutes was spent with patient more than half of which  was spent in counseling patient on the above mentioned issues , reviewing and explaining recent labs and imaging studies done, and coordination of care.  Medications Discontinued During This Encounter  Medication Reason  . meclizine (ANTIVERT) 12.5 MG tablet Patient has not taken in last 30 days  . metoprolol tartrate (LOPRESSOR) 25 MG tablet Duplicate  . ondansetron (ZOFRAN ODT) 4 MG disintegrating tablet Patient has not taken in last 30 days  . fluticasone (FLONASE) 50 MCG/ACT nasal spray Reorder  . risedronate (ACTONEL) 35 MG tablet Reorder  . aspirin 81 MG tablet   . metoprolol tartrate (LOPRESSOR) 25 MG tablet     Follow-up: Return in about 6 months (around 01/02/2018).   Crecencio Mc, MD

## 2017-07-05 DIAGNOSIS — Z0000 Encounter for general adult medical examination without abnormal findings: Secondary | ICD-10-CM | POA: Insufficient documentation

## 2017-07-05 NOTE — Assessment & Plan Note (Signed)
Rsolved,  Had Occurred in in the setting of vertigo and vomiting with decreased GFR from volume loss.    Lab Results  Component Value Date   CALCIUM 9.6 07/03/2017

## 2017-07-05 NOTE — Assessment & Plan Note (Addendum)
Lipids are at goal  On pravastatin   No  change in statin therapy  is advised given her age .  Aspirin therapy discontinued given results of ASPREE trial   Lab Results  Component Value Date   CHOL 139 07/03/2017   HDL 41.10 07/03/2017   LDLCALC 59 07/03/2017   TRIG 199.0 (H) 07/03/2017   CHOLHDL 3 07/03/2017    

## 2017-07-05 NOTE — Assessment & Plan Note (Signed)
treated with alendronate initially,  followed by change to risedronate.  History of  remote wrist fracture. Last DEXA 2015  t socres -2.1 but fracture risk very high at 37%

## 2017-07-05 NOTE — Assessment & Plan Note (Signed)
Mammogram normal may 2019

## 2017-07-05 NOTE — Assessment & Plan Note (Addendum)
Well controlled on current regimen. Stopping metoprolol as a trial. Renal function stable, no changes today.  Lab Results  Component Value Date   CREATININE 0.96 07/03/2017   Lab Results  Component Value Date   NA 140 07/03/2017   K 4.9 07/03/2017   CL 102 07/03/2017   CO2 33 (H) 07/03/2017

## 2017-07-05 NOTE — Assessment & Plan Note (Signed)
Occurring infrequently Discussed current controversy regarding prolonged use of PPI in patients without documented Barretts esophagus.  Patient has no prior EGD but has been on PPI therapy for > 5 years (per patient).  Suggested trial of pepcid 20 mg daily.  If GERD symptoms return,  advised her to accept referral for EGD.

## 2017-07-08 ENCOUNTER — Other Ambulatory Visit: Payer: Self-pay | Admitting: Internal Medicine

## 2017-07-10 ENCOUNTER — Ambulatory Visit (INDEPENDENT_AMBULATORY_CARE_PROVIDER_SITE_OTHER): Payer: Medicare Other | Admitting: Internal Medicine

## 2017-07-10 DIAGNOSIS — I10 Essential (primary) hypertension: Secondary | ICD-10-CM | POA: Diagnosis not present

## 2017-07-10 NOTE — Patient Instructions (Signed)
Restart metoprolol.  Take 25mg  1/2 table twice a day.  Monitor blood pressure and heart rate.  Update [place] with readings and let us know if any problems.

## 2017-07-10 NOTE — Progress Notes (Signed)
Patient ID: Tina Silva, female   DOB: 1934/09/11, 83 y.o.   MRN: [id number]   Subjective:    Patient ID: Tina Silva, female    DOB: [birth year], 83 y.o.   MRN: [id number]  HPI  Patient here as a work in with concerns regarding increased heart rate and elevated blood pressure.  Metoprolol was recently stopped.  Noticed this increase after stopping these medications. Stays active.  No chest pain.  No sob.  No acid reflux.  No abdominal pain.  Overall she feels good.  Brings in blood pressure and pulse readings.  Reviewed.  Highest reading 155/79 with pulse 117 - when checked.  States on her fit bit - pulse documented 145 while mowing.  She felt fine.  No syncope or near syncope.  No chest pain or sob.     Past Medical History:  Diagnosis Date  . Hyperlipidemia   . Hypertension   . Hypothyroidism   . Osteoporosis    Past Surgical History:  Procedure Laterality Date  . ABDOMINAL HYSTERECTOMY    . APPENDECTOMY  1974  . BREAST BIOPSY Left 11/04/2005   core  . COLONOSCOPY WITH PROPOFOL N/A 02/12/2016   Procedure: COLONOSCOPY WITH PROPOFOL;  Surgeon: Manya Silvas, MD;  Location: [facility] ENDOSCOPY;  Service: Endoscopy;  Laterality: N/A;  . EYE SURGERY  june 2013   cataract with lens,  dinglein left eye   . TONSILLECTOMY    . TONSILLECTOMY AND ADENOIDECTOMY     Family History  Problem Relation Age of Onset  . Parkinson's disease Mother   . Cancer Sister        AML  . Diabetes Sister   . Heart disease Father   . Diabetes Paternal Aunt   . Cancer Maternal Grandmother   . Breast cancer Neg Hx    Social History   Socioeconomic History  . Marital status: Single    Spouse name: Not on file  . Number of children: Not on file  . Years of education: Not on file  . Highest education level: Not on file  Occupational History  . Not on file  Social Needs  . Financial resource strain: Not on file  . Food insecurity:    Worry: Never true    Inability: Never true  . Transportation  needs:    Medical: No    Non-medical: No  Tobacco Use  . Smoking status: Never Smoker  . Smokeless tobacco: Never Used  Substance and Sexual Activity  . Alcohol use: No  . Drug use: No  . Sexual activity: Never  Lifestyle  . Physical activity:    Days per week: Not on file    Minutes per session: Not on file  . Stress: Not at all  Relationships  . Social connections:    Talks on phone: Not on file    Gets together: Not on file    Attends religious service: Not on file    Active member of club or organization: Not on file    Attends meetings of clubs or organizations: Not on file    Relationship status: Not on file  Other Topics Concern  . Not on file  Social History Narrative   Independent at baseline    Outpatient Encounter Medications as of 07/10/2017  Medication Sig  . calcium-vitamin D (OSCAL WITH D) 500-200 MG-UNIT tablet Take 1 tablet by mouth.   . Cholecalciferol (VITAMIN D3) 1000 units CAPS Take 1 capsule by mouth.  . fluticasone (FLONASE) 50  MCG/ACT nasal spray Place 2 sprays into both nostrils daily.  . Multiple Vitamin (MULTIVITAMIN) tablet Take 1 tablet by mouth daily.  . pravastatin (PRAVACHOL) 20 MG tablet Take 1 tablet (20 mg total) by mouth daily.  . risedronate (ACTONEL) 35 MG tablet TAKE 1 TABLET BY MOUTH WEEKLY SAME DAY OF EACH WEEK. TAKE FIRST THINGON ARISING WITH A FULL GLASS OF WATER. STAYUP RIGHT FOR 30 MINUTES.  . metoprolol succinate (TOPROL-XL) 25 MG 24 hr tablet 1/2 tablet bid   No facility-administered encounter medications on file as of 07/10/2017.     Review of Systems  Constitutional: Negative for appetite change and unexpected weight change.  HENT: Negative for congestion and sinus pressure.   Respiratory: Negative for cough, chest tightness and shortness of breath.   Cardiovascular: Negative for chest pain, palpitations and leg swelling.  Gastrointestinal: Negative for abdominal pain, diarrhea, nausea and vomiting.  Neurological: Negative  for dizziness, light-headedness and headaches.       Objective:     Blood pressure rechecked by me:  130/68  Physical Exam  Constitutional: She appears well-developed and well-nourished. No distress.  Neck: Neck supple.  Cardiovascular: Normal rate and regular rhythm.  Pulmonary/Chest: Breath sounds normal. No respiratory distress. She has no wheezes.  Abdominal: Soft. Bowel sounds are normal. There is no tenderness.  Musculoskeletal: She exhibits no edema or tenderness.  Lymphadenopathy:    She has no cervical adenopathy.  Psychiatric: She has a normal mood and affect. Her behavior is normal.    BP 130/68   Pulse 91   Temp 98.2 F (36.8 C)   Resp 18   Wt 139 lb 4 oz (63.2 kg)   SpO2 98%   BMI 24.28 kg/m  Wt Readings from Last 3 Encounters:  07/10/17 139 lb 4 oz (63.2 kg)  07/03/17 140 lb (63.5 kg)  04/01/17 138 lb (62.6 kg)     Lab Results  Component Value Date   WBC 5.2 07/03/2017   HGB 14.0 07/03/2017   HCT 40.8 07/03/2017   PLT 144.0 (L) 07/03/2017   GLUCOSE 95 07/03/2017   CHOL 139 07/03/2017   TRIG 199.0 (H) 07/03/2017   HDL 41.10 07/03/2017   LDLCALC 59 07/03/2017   ALT 21 07/03/2017   AST 19 07/03/2017   NA 140 07/03/2017   K 4.9 07/03/2017   CL 102 07/03/2017   CREATININE 0.96 07/03/2017   BUN 24 (H) 07/03/2017   CO2 33 (H) 07/03/2017   TSH 2.45 07/03/2017   HGBA1C 5.2 07/03/2017    Mm Screening Breast Tomo Bilateral  Result Date: 06/02/2017 CLINICAL DATA:  Screening. EXAM: DIGITAL SCREENING BILATERAL MAMMOGRAM WITH TOMO AND CAD COMPARISON:  Previous exam(s). ACR Breast Density Category b: There are scattered areas of fibroglandular density. FINDINGS: There are no findings suspicious for malignancy. Images were processed with CAD. IMPRESSION: No mammographic evidence of malignancy. A result letter of this screening mammogram will be mailed directly to the patient. RECOMMENDATION: Screening mammogram in one year. (Code:SM-B-01Y) BI-RADS CATEGORY  1:  Negative. Electronically Signed   By: Franki Cabot M.D.   On: 06/02/2017 12:52       Assessment & Plan:   Problem List Items Addressed This Visit    Hypertension    Recently stopped metoprolol.  Reviewed Dr Lupita Dawn last note.  Was on metoprolol 25mg  bid.  Has noticed since stopping an increase in her blood pressure and pulse as outlined.  She is very active.  Asymptomatic.  Some concern with stopping of blood  pressure being a little low.  Will have her restart metoprolol, but have her start metoprolol 25mg  1/2 tablet bid.  Follow pressures and pulse and send in readings.  Pt comfortable with this plan.        Relevant Medications   metoprolol succinate (TOPROL-XL) 25 MG 24 hr tablet       Einar Pheasant, MD

## 2017-07-13 ENCOUNTER — Encounter: Payer: Self-pay | Admitting: Internal Medicine

## 2017-07-13 MED ORDER — METOPROLOL SUCCINATE ER 25 MG PO TB24: ORAL_TABLET | ORAL | 0 refills | Status: DC

## 2017-07-13 NOTE — Assessment & Plan Note (Signed)
Recently stopped metoprolol.  Reviewed Dr Lupita Dawn last note.  Was on metoprolol 25mg  bid.  Has noticed since stopping an increase in her blood pressure and pulse as outlined.  She is very active.  Asymptomatic.  Some concern with stopping of blood pressure being a little low.  Will have her restart metoprolol, but have her start metoprolol 25mg  1/2 tablet bid.  Follow pressures and pulse and send in readings.  Pt comfortable with this plan.

## 2017-07-21 ENCOUNTER — Telehealth: Payer: Self-pay | Admitting: Internal Medicine

## 2017-07-21 NOTE — Telephone Encounter (Signed)
MyChart message sent . Home bp readings normal.

## 2017-08-13 DIAGNOSIS — H2511 Age-related nuclear cataract, right eye: Secondary | ICD-10-CM | POA: Diagnosis not present

## 2017-08-20 ENCOUNTER — Other Ambulatory Visit: Payer: Self-pay | Admitting: Internal Medicine

## 2017-10-07 ENCOUNTER — Other Ambulatory Visit: Payer: Self-pay | Admitting: Internal Medicine

## 2017-12-10 ENCOUNTER — Other Ambulatory Visit: Payer: Self-pay | Admitting: Internal Medicine

## 2018-01-02 ENCOUNTER — Ambulatory Visit (INDEPENDENT_AMBULATORY_CARE_PROVIDER_SITE_OTHER): Payer: Medicare Other | Admitting: Internal Medicine

## 2018-01-02 ENCOUNTER — Encounter: Payer: Self-pay | Admitting: Internal Medicine

## 2018-01-02 VITALS — BP 116/60 | HR 61 | Temp 98.0°F | Resp 14 | Ht 63.5 in | Wt 140.2 lb

## 2018-01-02 DIAGNOSIS — Z23 Encounter for immunization: Secondary | ICD-10-CM

## 2018-01-02 DIAGNOSIS — R739 Hyperglycemia, unspecified: Secondary | ICD-10-CM

## 2018-01-02 DIAGNOSIS — Z0000 Encounter for general adult medical examination without abnormal findings: Secondary | ICD-10-CM

## 2018-01-02 DIAGNOSIS — Z79899 Other long term (current) drug therapy: Secondary | ICD-10-CM

## 2018-01-02 DIAGNOSIS — M810 Age-related osteoporosis without current pathological fracture: Secondary | ICD-10-CM | POA: Diagnosis not present

## 2018-01-02 DIAGNOSIS — I10 Essential (primary) hypertension: Secondary | ICD-10-CM

## 2018-01-02 DIAGNOSIS — E785 Hyperlipidemia, unspecified: Secondary | ICD-10-CM

## 2018-01-02 LAB — COMPREHENSIVE METABOLIC PANEL
ALT: 14 U/L (ref 0–35)
AST: 17 U/L (ref 0–37)
Albumin: 4.2 g/dL (ref 3.5–5.2)
Alkaline Phosphatase: 54 U/L (ref 39–117)
BUN: 28 mg/dL — ABNORMAL HIGH (ref 6–23)
CO2: 31 meq/L (ref 19–32)
Calcium: 9.6 mg/dL (ref 8.4–10.5)
Chloride: 104 mEq/L (ref 96–112)
Creatinine, Ser: 0.95 mg/dL (ref 0.40–1.20)
GFR: 60 mL/min — ABNORMAL LOW (ref >60.00)
Glucose, Bld: 95 mg/dL (ref 70–99)
Potassium: 4.7 mEq/L (ref 3.5–5.1)
Sodium: 140 mEq/L (ref 135–145)
Total Bilirubin: 0.4 mg/dL (ref 0.2–1.2)
Total Protein: 6.7 g/dL (ref 6.0–8.3)

## 2018-01-02 NOTE — Patient Instructions (Signed)
You are doing well! No changes to medication today  Your bone density test has been ordered    Best wishes for a joyous Christmas season and a happy New Year!

## 2018-01-02 NOTE — Progress Notes (Signed)
Subjective:  Patient ID: Tina Silva, female    DOB: [birth year]  Age: 83 y.o. MRN: [id number]  CC: The primary encounter diagnosis was Osteoporosis, post-menopausal. Diagnoses of Long-term use of high-risk medication, Need for 23-polyvalent pneumococcal polysaccharide vaccine, Hyperglycemia, Hyperlipidemia with target LDL less than 100, Essential hypertension, and Encounter for preventive health examination were also pertinent to this visit.  HPI Tina Silva presents for follow up on hypertension. GERD , etc  Hypertension: patient checks blood pressure twice weekly at home.  Readings have been for the most part < 140/80 at rest . Patient is following a reduce salt diet most days and is taking medications as prescribed  Had a reaction to the shingles vaccine , increased pulse overnight   Last DEXA 2015  [facility]  Taking actonel  Outpatient Medications Prior to Visit  Medication Sig Dispense Refill  . calcium-vitamin D (OSCAL WITH D) 500-200 MG-UNIT tablet Take 1 tablet by mouth.     . Cholecalciferol (VITAMIN D3) 1000 units CAPS Take 1 capsule by mouth.    . fluticasone (FLONASE) 50 MCG/ACT nasal spray Place 2 sprays into both nostrils daily. 16 g 5  . metoprolol succinate (TOPROL-XL) 25 MG 24 hr tablet ONE-HALF TABLET BY MOUTH TWICE DAILY 90 tablet 1  . Multiple Vitamin (MULTIVITAMIN) tablet Take 1 tablet by mouth daily.    . pravastatin (PRAVACHOL) 20 MG tablet TAKE ONE TABLET BY MOUTH EVERY DAY 90 tablet 3  . risedronate (ACTONEL) 35 MG tablet TAKE 1 TABLET BY MOUTH WEEKLY SAME DAY OF EACH WEEK. TAKE FIRST THINGARISING WITH A FULL GLASS OF WATER. STAY UPRIGHTFOR 30 MINUTES. 12 tablet 3   No facility-administered medications prior to visit.     Review of Systems;  Patient denies headache, fevers, malaise, unintentional weight loss, skin rash, eye pain, sinus congestion and sinus pain, sore throat, dysphagia,  hemoptysis , cough, dyspnea, wheezing, chest pain, palpitations,  orthopnea, edema, abdominal pain, nausea, melena, diarrhea, constipation, flank pain, dysuria, hematuria, urinary  Frequency, nocturia, numbness, tingling, seizures,  Focal weakness, Loss of consciousness,  Tremor, insomnia, depression, anxiety, and suicidal ideation.      Objective:  BP 116/60 (BP Location: Left Arm, Patient Position: Sitting, Cuff Size: Normal)   Pulse 61   Temp 98 F (36.7 C) (Oral)   Resp 14   Ht 5' 3.5" (1.613 m)   Wt 140 lb 3.2 oz (63.6 kg)   SpO2 96%   BMI 24.45 kg/m   BP Readings from Last 3 Encounters:  01/02/18 116/60  07/13/17 130/68  07/03/17 118/64    Wt Readings from Last 3 Encounters:  01/02/18 140 lb 3.2 oz (63.6 kg)  07/10/17 139 lb 4 oz (63.2 kg)  07/03/17 140 lb (63.5 kg)    General appearance: alert, cooperative and appears stated age Ears: normal TM's and external ear canals both ears Throat: lips, mucosa, and tongue normal; teeth and gums normal Neck: no adenopathy, no carotid bruit, supple, symmetrical, trachea midline and thyroid not enlarged, symmetric, no tenderness/mass/nodules Back: symmetric, no curvature. ROM normal. No CVA tenderness. Lungs: clear to auscultation bilaterally Heart: regular rate and rhythm, S1, S2 normal, no murmur, click, rub or gallop Abdomen: soft, non-tender; bowel sounds normal; no masses,  no organomegaly Pulses: 2+ and symmetric Skin: Skin color, texture, turgor normal. No rashes or lesions Lymph nodes: Cervical, supraclavicular, and axillary nodes normal.  Lab Results  Component Value Date   HGBA1C 5.2 07/03/2017   HGBA1C 5.6 01/10/2012    Lab  Results  Component Value Date   CREATININE 0.95 01/02/2018   CREATININE 0.96 07/03/2017   CREATININE 0.92 04/11/2017    Lab Results  Component Value Date   WBC 5.2 07/03/2017   HGB 14.0 07/03/2017   HCT 40.8 07/03/2017   PLT 144.0 (L) 07/03/2017   GLUCOSE 95 01/02/2018   CHOL 139 07/03/2017   TRIG 199.0 (H) 07/03/2017   HDL 41.10 07/03/2017    LDLCALC 59 07/03/2017   ALT 14 01/02/2018   AST 17 01/02/2018   NA 140 01/02/2018   K 4.7 01/02/2018   CL 104 01/02/2018   CREATININE 0.95 01/02/2018   BUN 28 (H) 01/02/2018   CO2 31 01/02/2018   TSH 2.45 07/03/2017   HGBA1C 5.2 07/03/2017    Mm Screening Breast Tomo Bilateral  Result Date: 06/02/2017 CLINICAL DATA:  Screening. EXAM: DIGITAL SCREENING BILATERAL MAMMOGRAM WITH TOMO AND CAD COMPARISON:  Previous exam(s). ACR Breast Density Category b: There are scattered areas of fibroglandular density. FINDINGS: There are no findings suspicious for malignancy. Images were processed with CAD. IMPRESSION: No mammographic evidence of malignancy. A result letter of this screening mammogram will be mailed directly to the patient. RECOMMENDATION: Screening mammogram in one year. (Code:SM-B-01Y) BI-RADS CATEGORY  1: Negative. Electronically Signed   By: Franki Cabot M.D.   On: 06/02/2017 12:52    Assessment & Plan:   Problem List Items Addressed This Visit    Encounter for preventive health examination    age appropriate education and counseling updated, referrals for preventative services and immunizations addressed, dietary and smoking counseling addressed, most recent labs reviewed.  I have personally reviewed and have noted:  1) the patient's medical and social history 2) The pt's use of alcohol, tobacco, and illicit drugs 3) The patient's current medications and supplements 4) Functional ability including ADL's, fall risk, home safety risk, hearing and visual impairment 5) Diet and physical activities 6) Evidence for depression or mood disorder 7) The patient's height, weight, and BMI have been recorded in the chart  I have made referrals, and provided counseling and education based on review of the above      Hyperglycemia    She has no signs of prediabetes by A1c  Lab Results  Component Value Date   HGBA1C 5.2 07/03/2017         Hyperlipidemia with target LDL less than  100    Lipids are at goal  On pravastatin   No  change in statin therapy  is advised given her age .  Aspirin therapy discontinued given results of ASPREE trial   Lab Results  Component Value Date   CHOL 139 07/03/2017   HDL 41.10 07/03/2017   LDLCALC 59 07/03/2017   TRIG 199.0 (H) 07/03/2017   CHOLHDL 3 07/03/2017         Hypertension    Well controlled on current regimen. Renal function stable, no changes today.  Lab Results  Component Value Date   CREATININE 0.95 01/02/2018   Lab Results  Component Value Date   NA 140 01/02/2018   K 4.7 01/02/2018   CL 104 01/02/2018   CO2 31 01/02/2018         Osteoporosis, post-menopausal - Primary    treated with alendronate initially,  followed by change to risedronate.  History of  remote wrist fracture. Last DEXA 2015  t scores -2.1 but fracture risk very high at 37%. Repeat DEXA ordered.         Relevant Orders  DG Bone Density    Other Visit Diagnoses    Long-term use of high-risk medication       Relevant Orders   Comprehensive metabolic panel (Completed)   Need for 23-polyvalent pneumococcal polysaccharide vaccine       Relevant Orders   Pneumococcal polysaccharide vaccine 23-valent greater than or equal to 2yo subcutaneous/IM (Completed)      I am having Nonda Lou. Fair maintain her multivitamin, calcium-vitamin D, Vitamin D3, fluticasone, metoprolol succinate, risedronate, and pravastatin.  No orders of the defined types were placed in this encounter.   There are no discontinued medications.  Follow-up: Return in about 6 months (around 07/04/2018) for CPE.   Crecencio Mc, MD

## 2018-01-04 NOTE — Assessment & Plan Note (Addendum)
treated with alendronate initially,  followed by change to risedronate.  History of  remote wrist fracture. Last DEXA 2015  t scores -2.1 but fracture risk very high at 37%. Repeat DEXA ordered.

## 2018-01-04 NOTE — Assessment & Plan Note (Signed)
She has no signs of prediabetes by A1c  Lab Results  Component Value Date   HGBA1C 5.2 07/03/2017

## 2018-01-04 NOTE — Assessment & Plan Note (Signed)

## 2018-01-04 NOTE — Assessment & Plan Note (Signed)
Well controlled on current regimen. Renal function stable, no changes today.  Lab Results  Component Value Date   CREATININE 0.95 01/02/2018   Lab Results  Component Value Date   NA 140 01/02/2018   K 4.7 01/02/2018   CL 104 01/02/2018   CO2 31 01/02/2018

## 2018-01-04 NOTE — Assessment & Plan Note (Signed)
Lipids are at goal  On pravastatin   No  change in statin therapy  is advised given her age .  Aspirin therapy discontinued given results of ASPREE trial   Lab Results  Component Value Date   CHOL 139 07/03/2017   HDL 41.10 07/03/2017   LDLCALC 59 07/03/2017   TRIG 199.0 (H) 07/03/2017   CHOLHDL 3 07/03/2017

## 2018-01-13 ENCOUNTER — Telehealth: Payer: Self-pay

## 2018-01-13 NOTE — Telephone Encounter (Signed)
Copied from [place] [id number]. Topic: General - Other >> Jan 13, 2018  2:22 PM Rayann Heman wrote: Reason for CRM: pt called to check status of bone density test. Please advise

## 2018-02-04 ENCOUNTER — Ambulatory Visit
Admission: RE | Admit: 2018-02-04 | Discharge: 2018-02-04 | Disposition: A | Discharge status: Home / Self Care | Payer: Medicare Other | Source: Ambulatory Visit | Attending: Internal Medicine | Admitting: Internal Medicine

## 2018-02-04 DIAGNOSIS — M810 Age-related osteoporosis without current pathological fracture: Secondary | ICD-10-CM | POA: Diagnosis not present

## 2018-02-04 DIAGNOSIS — M8589 Other specified disorders of bone density and structure, multiple sites: Secondary | ICD-10-CM | POA: Diagnosis not present

## 2018-02-05 ENCOUNTER — Other Ambulatory Visit: Payer: Self-pay | Admitting: Internal Medicine

## 2018-02-28 ENCOUNTER — Other Ambulatory Visit: Payer: Self-pay | Admitting: Internal Medicine

## 2018-04-03 ENCOUNTER — Ambulatory Visit (INDEPENDENT_AMBULATORY_CARE_PROVIDER_SITE_OTHER): Payer: Medicare Other

## 2018-04-03 VITALS — BP 118/64 | HR 67 | Temp 98.2°F | Resp 15 | Ht 63.5 in | Wt 138.8 lb

## 2018-04-03 DIAGNOSIS — Z0000 Encounter for general adult medical examination without abnormal findings: Secondary | ICD-10-CM | POA: Diagnosis not present

## 2018-04-03 NOTE — Patient Instructions (Addendum)
  Tina Silva , Thank you for taking time to come for your Medicare Wellness Visit. I appreciate your ongoing commitment to your health goals. Please review the following plan we discussed and let me know if I can assist you in the future.   These are the goals we discussed: Goals      Patient Stated   . Maintain Healthy Lifestyle (pt-stated)     Maintain weight        This is a list of the screening recommended for you and due dates:  Health Maintenance  Topic Date Due  . Tetanus Vaccine  12/29/2021  . Flu Shot  Completed  . DEXA scan (bone density measurement)  Completed  . Pneumonia vaccines  Completed

## 2018-04-03 NOTE — Progress Notes (Addendum)
Subjective:   Tina Silva is a 84 y.o. female who presents for Medicare Annual (Subsequent) preventive examination.  Review of Systems:  No ROS.  Medicare Wellness Visit. Additional risk factors are reflected in the social history. Cardiac Risk Factors include: advanced age (>41men, >44 women);hypertension     Objective:     Vitals: BP 118/64 (BP Location: Left Arm, Patient Position: Sitting, Cuff Size: Normal)   Pulse 67   Temp 98.2 F (36.8 C) (Oral)   Resp 15   Ht 5' 3.5" (1.613 m)   Wt 138 lb 12.8 oz (63 kg)   SpO2 98%   BMI 24.20 kg/m   Body mass index is 24.2 kg/m.  Advanced Directives 04/03/2018 04/01/2017 07/28/2016 07/28/2016 07/03/2016 03/26/2016 03/14/2015  Does Patient Have a Medical Advance Directive? Yes Yes Yes No Yes Yes Yes  Type of Paramedic of [place];Living will [place];Living will [place];Living will - [place];Living will [place];Living will [place];Living will  Does patient want to make changes to medical advance directive? - No - Patient declined No - Patient declined - No - Patient declined No - Patient declined No - Patient declined  Copy of [place] in Chart? Yes - validated most recent copy scanned in chart (See row information) No - copy requested No - copy requested - - Yes No - copy requested  Would patient like information on creating a medical advance directive? - - No - Patient declined No - Patient declined - - -    Tobacco Social History   Tobacco Use  Smoking Status Never Smoker  Smokeless Tobacco Never Used     Counseling given: Not Answered   Clinical Intake:  Pre-visit preparation completed: Yes  Pain : No/denies pain     Diabetes: No  How often do you need to have someone help you when you read instructions, pamphlets, or other written materials from your doctor or pharmacy?: 1 -  Never  Interpreter Needed?: No     Past Medical History:  Diagnosis Date  . Hyperlipidemia   . Hypertension   . Hypothyroidism   . Osteoporosis    Past Surgical History:  Procedure Laterality Date  . ABDOMINAL HYSTERECTOMY    . APPENDECTOMY  1974  . BREAST BIOPSY Left 11/04/2005   core  . COLONOSCOPY WITH PROPOFOL N/A 02/12/2016   Procedure: COLONOSCOPY WITH PROPOFOL;  Surgeon: Manya Silvas, MD;  Location[facility] END[facility]COPY;  Service: Endoscopy;  Laterality: N/A;  . EYE SURGERY  june 2013   cataract with lens,  dinglein left eye   . TONSILLECTOMY    . TONSILLECTOMY AND ADENOIDECTOMY     Family History  Problem Relation Age of Onset  . Parkinson's disease Mother   . Cancer Sister        AML  . Diabetes Sister   . Heart disease Father   . Diabetes Paternal Aunt   . Cancer Maternal Grandmother   . Breast cancer Neg Hx    Social History   Socioeconomic History  . Marital status: Single    Spouse name: Not on file  . Number of children: Not on file  . Years of education: Not on file  . Highest education level: Not on file  Occupational History  . Not on file  Social Needs  . Financial resource strain: Not hard at all  . Food insecurity:    Worry: Never  true    Inability: Never true  . Transportation needs:    Medical: No    Non-medical: No  Tobacco Use  . Smoking status: Never Smoker  . Smokeless tobacco: Never Used  Substance and Sexual Activity  . Alcohol use: No  . Drug use: No  . Sexual activity: Never  Lifestyle  . Physical activity:    Days per week: 6 days    Minutes per session: 60 min  . Stress: Not at all  Relationships  . Social connections:    Talks on phone: Not on file    Gets together: Not on file    Attends religious service: Not on file    Active member of club or organization: Not on file    Attends meetings of clubs or organizations: Not on file    Relationship status: Not on file  Other Topics Concern  . Not on file  Social  History Narrative   Independent at baseline    Outpatient Encounter Medications as of 04/03/2018  Medication Sig  . Calcium Carbonate-Vit D-Min (CALTRATE PLUS PO) Take 1,200 mg by mouth daily.  . calcium-vitamin D (OSCAL WITH D) 500-200 MG-UNIT tablet Take 1 tablet by mouth.   . Cholecalciferol (VITAMIN D3) 1000 units CAPS Take 1 capsule by mouth.  . fluticasone (FLONASE) 50 MCG/ACT nasal spray Place 2 sprays into both nostrils daily.  . metoprolol succinate (TOPROL-XL) 25 MG 24 hr tablet TAKE 1/2 TABLET TWICE A DAY  . Multiple Vitamin (MULTIVITAMIN) tablet Take 1 tablet by mouth daily.  . pravastatin (PRAVACHOL) 20 MG tablet TAKE ONE TABLET BY MOUTH EVERY DAY  . risedronate (ACTONEL) 35 MG tablet TAKE 1 TABLET BY MOUTH WEEKLY SAME DAY OF EACH WEEK. TAKE FIRST THINGARISING WITH A FULL GLASS OF WATER. STAY UPRIGHTFOR 30 MINUTES.   No facility-administered encounter medications on file as of 04/03/2018.     Activities of Daily Living In your present state of health, do you have any difficulty performing the following activities: 04/03/2018  Hearing? Y  Vision? N  Difficulty concentrating or making decisions? N  Walking or climbing stairs? N  Dressing or bathing? N  Doing errands, shopping? N  Preparing Food and eating ? N  Using the Toilet? N  In the past six months, have you accidently leaked urine? N  Do you have problems with loss of bowel control? N  Managing your Medications? N  Managing your Finances? N  Housekeeping or managing your Housekeeping? N  Some recent data might be hidden    Patient Care Team: Crecencio Mc, MD as PCP - General (Internal Medicine)    Assessment:   This is a routine wellness examination for [place].  Health Screenings  Mammogram -06/02/17 Colonoscopy -02/12/16 Bone Density -02/04/18 Glaucoma -none Hearing- difficulty hearing conversational tones in a large crowd Hemoglobin A1C -07/03/17 Cholesterol -07/03/17 TSH-07/03/17 Dental-every 6 months Vision-  every 12 months  Social  Alcohol intake -no Smoking history- never Smokers in home? none Illicit drug use? none Exercise --treadmill, weights, 5 days weekly, 60 min Diet -regular Sexually Active -never  Safety  Patient feels safe at home.  Patient does have smoke detectors at home  Patient does wear sunscreen or protective clothing when in direct sunlight  Patient does wear seat belt when driving or riding with others.   Activities of Daily Living Patient can do their own household chores. Denies needing assistance with: driving, feeding themselves, getting from bed to chair, getting to the toilet, bathing/showering, dressing,  managing money, climbing flight of stairs, or preparing meals.   Depression Screen Patient denies losing interest in daily life, feeling hopeless, or crying easily over simple problems.   Fall Screen Patient denies being afraid of falling or falling in the last year.   Memory Screen Patient denies problems with memory, misplacing items, and is able to balance checkbook/bank accounts.  Patient is alert, normal appearance, oriented to person/place/and time. Correctly identified the president of the [place], recall of 2/3 objects, and performing simple calculations.  Patient displays appropriate judgement and can read correct time from watch face.   Immunizations The following Immunizations are up to date: Influenza, shingles, pneumonia, and tetanus.   Other Providers Patient Care Team: Crecencio Mc, MD as PCP - General (Internal Medicine)  Exercise Activities and Dietary recommendations Current Exercise Habits: Home exercise routine  Goals      Patient Stated   . Maintain Healthy Lifestyle (pt-stated)     Maintain weight        Fall Risk Fall Risk  04/03/2018 04/01/2017 10/29/2016 07/03/2016 03/26/2016  Falls in the past year? 0 No No No No    Depression Screen PHQ 2/9 Scores 04/03/2018 04/01/2017 10/29/2016 07/03/2016  PHQ - 2 Score 0 0 0 0      Cognitive Function MMSE - Mini Mental State Exam 04/01/2017 03/26/2016 03/14/2015  Orientation to time 5 5 5   Orientation to Place 5 5 5   Registration 3 3 3   Attention/ Calculation 5 5 5   Recall 3 3 3   Language- name 2 objects 2 2 2   Language- repeat 1 1 1   Language- follow 3 step command 3 3 3   Language- read & follow direction 1 1 1   Write a sentence 1 1 1   Copy design 1 1 1   Total score 30 30 30      6CIT Screen 04/03/2018  What Year? 0 points  What month? 0 points  What time? 0 points  Count back from 20 0 points  Months in reverse 0 points  Repeat phrase 0 points  Total Score 0    Immunization History  Administered Date(s) Administered  . Influenza Whole 10/22/2011  . Influenza, High Dose Seasonal PF 10/18/2016, 10/21/2017  . Influenza-Unspecified 11/05/2012, 10/11/2013, 10/06/2014, 10/15/2015  . Pneumococcal Conjugate-13 10/12/2013  . Pneumococcal Polysaccharide-23 07/15/2012, 01/02/2018  . Tdap 12/30/2011  . Zoster 07/22/2011  . Zoster Recombinat (Shingrix) 10/07/2017, 12/09/2017   Screening Tests Health Maintenance  Topic Date Due  . TETANUS/TDAP  12/29/2021  . INFLUENZA VACCINE  Completed  . DEXA SCAN  Completed  . PNA vac Low Risk Adult  Completed      Plan:    End of life planning; Advance aging; Advanced directives discussed. Copy of current HCPOA/Living Will on file.   I have personally reviewed and noted the following in the patient's chart:   . Medical and social history . Use of alcohol, tobacco or illicit drugs  . Current medications and supplements . Functional ability and status . Nutritional status . Physical activity . Advanced directives . List of other physicians . Hospitalizations, surgeries, and ER visits in previous 12 months . Vitals . Screenings to include cognitive, depression, and falls . Referrals and appointments  In addition, I have reviewed and discussed with patient certain preventive protocols, quality metrics, and best  practice recommendations. A written personalized care plan for preventive services as well as general preventive health recommendations were provided to patient.     Varney Biles, LPN  05/31/6642  I have reviewed the above information and agree with above.   Teresa Tullo, MD 

## 2018-04-20 ENCOUNTER — Other Ambulatory Visit: Payer: Self-pay | Admitting: Internal Medicine

## 2018-04-20 DIAGNOSIS — Z1231 Encounter for screening mammogram for malignant neoplasm of breast: Secondary | ICD-10-CM

## 2018-06-23 ENCOUNTER — Other Ambulatory Visit: Payer: Self-pay | Admitting: Internal Medicine

## 2018-07-06 ENCOUNTER — Ambulatory Visit: Payer: Medicare Other | Admitting: Internal Medicine

## 2018-07-20 ENCOUNTER — Other Ambulatory Visit: Payer: Self-pay

## 2018-07-20 ENCOUNTER — Ambulatory Visit
Admission: RE | Admit: 2018-07-20 | Discharge: 2018-07-20 | Disposition: A | Discharge status: Home / Self Care | Payer: Medicare Other | Source: Ambulatory Visit | Attending: Internal Medicine | Admitting: Internal Medicine

## 2018-07-20 DIAGNOSIS — Z1231 Encounter for screening mammogram for malignant neoplasm of breast: Secondary | ICD-10-CM | POA: Diagnosis not present

## 2018-07-28 ENCOUNTER — Other Ambulatory Visit: Payer: Self-pay

## 2018-08-04 ENCOUNTER — Other Ambulatory Visit: Payer: Self-pay

## 2018-08-04 ENCOUNTER — Encounter: Payer: Self-pay | Admitting: Internal Medicine

## 2018-08-04 ENCOUNTER — Ambulatory Visit (INDEPENDENT_AMBULATORY_CARE_PROVIDER_SITE_OTHER): Payer: Medicare Other | Admitting: Internal Medicine

## 2018-08-04 DIAGNOSIS — E785 Hyperlipidemia, unspecified: Secondary | ICD-10-CM | POA: Diagnosis not present

## 2018-08-04 DIAGNOSIS — R739 Hyperglycemia, unspecified: Secondary | ICD-10-CM | POA: Diagnosis not present

## 2018-08-04 DIAGNOSIS — E559 Vitamin D deficiency, unspecified: Secondary | ICD-10-CM | POA: Diagnosis not present

## 2018-08-04 DIAGNOSIS — I10 Essential (primary) hypertension: Secondary | ICD-10-CM

## 2018-08-04 DIAGNOSIS — R2231 Localized swelling, mass and lump, right upper limb: Secondary | ICD-10-CM

## 2018-08-04 DIAGNOSIS — M810 Age-related osteoporosis without current pathological fracture: Secondary | ICD-10-CM | POA: Diagnosis not present

## 2018-08-04 DIAGNOSIS — L603 Nail dystrophy: Secondary | ICD-10-CM

## 2018-08-04 DIAGNOSIS — R5383 Other fatigue: Secondary | ICD-10-CM

## 2018-08-04 DIAGNOSIS — L03011 Cellulitis of right finger: Secondary | ICD-10-CM | POA: Diagnosis not present

## 2018-08-04 DIAGNOSIS — R251 Tremor, unspecified: Secondary | ICD-10-CM | POA: Diagnosis not present

## 2018-08-04 NOTE — Patient Instructions (Signed)
Referral to Dr Kellie Moor to evaluate your 5th finger swelling  If your hand tremor becomes ore noticeable  Let me know and I will refer you to neurology for further workup   Health Maintenance for Postmenopausal Women Menopause is a normal process in which your ability to get pregnant comes to an end. This process happens slowly over many months or years, usually between the ages of 77 and 50. Menopause is complete when you have missed your menstrual periods for 12 months. It is important to talk with your health care provider about some of the most common conditions that affect women after menopause (postmenopausal women). These include heart disease, cancer, and bone loss (osteoporosis). Adopting a healthy lifestyle and getting preventive care can help to promote your health and wellness. The actions you take can also lower your chances of developing some of these common conditions. What should I know about menopause? During menopause, you may get a number of symptoms, such as:  Hot flashes. These can be moderate or severe.  Night sweats.  Decrease in sex drive.  Mood swings.  Headaches.  Tiredness.  Irritability.  Memory problems.  Insomnia. Choosing to treat or not to treat these symptoms is a decision that you make with your health care provider. Do I need hormone replacement therapy?  Hormone replacement therapy is effective in treating symptoms that are caused by menopause, such as hot flashes and night sweats.  Hormone replacement carries certain risks, especially as you become older. If you are thinking about using estrogen or estrogen with progestin, discuss the benefits and risks with your health care provider. What is my risk for heart disease and stroke? The risk of heart disease, heart attack, and stroke increases as you age. One of the causes may be a change in the body's hormones during menopause. This can affect how your body uses dietary fats, triglycerides, and  cholesterol. Heart attack and stroke are medical emergencies. There are many things that you can do to help prevent heart disease and stroke. Watch your blood pressure  High blood pressure causes heart disease and increases the risk of stroke. This is more likely to develop in people who have high blood pressure readings, are of African descent, or are overweight.  Have your blood pressure checked: ? Every 3-5 years if you are 48-28 years of age. ? Every year if you are 21 years old or older. Eat a healthy diet   Eat a diet that includes plenty of vegetables, fruits, low-fat dairy products, and lean protein.  Do not eat a lot of foods that are high in solid fats, added sugars, or sodium. Get regular exercise Get regular exercise. This is one of the most important things you can do for your health. Most adults should:  Try to exercise for at least 150 minutes each week. The exercise should increase your heart rate and make you sweat (moderate-intensity exercise).  Try to do strengthening exercises at least twice each week. Do these in addition to the moderate-intensity exercise.  Spend less time sitting. Even light physical activity can be beneficial. Other tips  Work with your health care provider to achieve or maintain a healthy weight.  Do not use any products that contain nicotine or tobacco, such as cigarettes, e-cigarettes, and chewing tobacco. If you need help quitting, ask your health care provider.  Know your numbers. Ask your health care provider to check your cholesterol and your blood sugar (glucose). Continue to have your blood tested as  directed by your health care provider. Do I need screening for cancer? Depending on your health history and family history, you may need to have cancer screening at different stages of your life. This may include screening for:  Breast cancer.  Cervical cancer.  Lung cancer.  Colorectal cancer. What is my risk for osteoporosis?  After menopause, you may be at increased risk for osteoporosis. Osteoporosis is a condition in which bone destruction happens more quickly than new bone creation. To help prevent osteoporosis or the bone fractures that can happen because of osteoporosis, you may take the following actions:  If you are 51-37 years old, get at least 1,000 mg of calcium and at least 600 mg of vitamin D per day.  If you are older than age 29 but younger than age 31, get at least 1,200 mg of calcium and at least 600 mg of vitamin D per day.  If you are older than age 48, get at least 1,200 mg of calcium and at least 800 mg of vitamin D per day. Smoking and drinking excessive alcohol increase the risk of osteoporosis. Eat foods that are rich in calcium and vitamin D, and do weight-bearing exercises several times each week as directed by your health care provider. How does menopause affect my mental health? Depression may occur at any age, but it is more common as you become older. Common symptoms of depression include:  Low or sad mood.  Changes in sleep patterns.  Changes in appetite or eating patterns.  Feeling an overall lack of motivation or enjoyment of activities that you previously enjoyed.  Frequent crying spells. Talk with your health care provider if you think that you are experiencing depression. General instructions See your health care provider for regular wellness exams and vaccines. This may include:  Scheduling regular health, dental, and eye exams.  Getting and maintaining your vaccines. These include: ? Influenza vaccine. Get this vaccine each year before the flu season begins. ? Pneumonia vaccine. ? Shingles vaccine. ? Tetanus, diphtheria, and pertussis (Tdap) booster vaccine. Your health care provider may also recommend other immunizations. Tell your health care provider if you have ever been abused or do not feel safe at home. Summary  Menopause is a normal process in which your  ability to get pregnant comes to an end.  This condition causes hot flashes, night sweats, decreased interest in sex, mood swings, headaches, or lack of sleep.  Treatment for this condition may include hormone replacement therapy.  Take actions to keep yourself healthy, including exercising regularly, eating a healthy diet, watching your weight, and checking your blood pressure and blood sugar levels.  Get screened for cancer and depression. Make sure that you are up to date with all your vaccines. This information is not intended to replace advice given to you by your health care provider. Make sure you discuss any questions you have with your health care provider. Document Released: 03/08/2005 Document Revised: 01/07/2018 Document Reviewed: 01/07/2018 Elsevier Patient Education  2020 [employer].

## 2018-08-04 NOTE — Progress Notes (Signed)
Patient ID: Tina Silva, female    DOB: [birth year]  Age: 84 y.o. MRN: [id number]  The patient is here for follow up and management of other chronic and acute problems.  Mammogram normal in June DEXA done Jan 2020 on actonel since  jan 2012 with one year holiday during which  t scores worsened and therapy with bisphosphonate resumed in 2015  Spine t scores worsened from 2015 to 2020 -1.6 to -2.2  Femur unchanged -2.1    The risk factors are reflected in the social history.  The roster of all physicians providing medical care to patient - is listed in the Snapshot section of the chart.  Activities of daily living:  The patient is 100% independent in all ADLs: dressing, toileting, feeding as well as independent mobility  Home safety : The patient has smoke detectors in the home. They wear seatbelts.  There are no firearms at home. There is no violence in the home.   There is no risks for hepatitis, STDs or HIV. There is no   history of blood transfusion. They have no travel history to infectious disease endemic areas of the world.  The patient has seen their dentist in the last six month. They have seen their eye doctor in the last year. They admit to slight hearing difficulty with regard to whispered voices and some television programs.  They have deferred audiologic testing in the last year.  They do not  have excessive sun exposure. Discussed the need for sun protection: hats, long sleeves and use of sunscreen if there is significant sun exposure.   Diet: the importance of a healthy diet is discussed. They do have a healthy diet.  The benefits of regular aerobic exercise were discussed. She walks 4 times per week ,  20 minutes.   Depression screen: there are no signs or vegative symptoms of depression- irritability, change in appetite, anhedonia, sadness/tearfullness.  Cognitive assessment: the patient manages all their financial and personal affairs and is actively engaged. They could  relate day,date,year and events; recalled 2/3 objects at 3 minutes; performed clock-face test normally.  The following portions of the patient's history were reviewed and updated as appropriate: allergies, current medications, past family history, past medical history,  past surgical history, past social history  and problem list.  Visual acuity was not assessed per patient preference since she has regular follow up with her ophthalmologist. Hearing and body mass index were assessed and reviewed.   During the course of the visit the patient was educated and counseled about appropriate screening and preventive services including : fall prevention , diabetes screening, nutrition counseling, colorectal cancer screening, and recommended immunizations.    CC: The primary encounter diagnosis was Hypocalcemia. Diagnoses of Osteoporosis, post-menopausal, Hyperglycemia, Hyperlipidemia with target LDL less than 100, Vitamin D deficiency, Fatigue, unspecified type, Dystrophic nail, Paronychia of finger, right, Essential hypertension, Tremor of left hand, and Nodule of finger of right hand were also pertinent to this visit.  Had an episode of severe left sided thoracic pain in Feb while sitting in a recliner and leaning back, lasted a few seconds  acc'd by involuntary contraction of middle finger on left hand  .  Now has noticed a mild tremor of the lef hadn when arm is outstretched  2) recurrent paronychia and focal nodular swellig  involving  5th finger right hand nail is now dystrophic  On the medial side And cuticle is separated from  The nail bed.  Discussed referral to [facility] .  3) Hypertension: patient checks blood pressure twice weekly at home.  Readings have been for the most part < 140/80 at rest . Patient is following a reduce salt diet most days and is taking medications as prescribed.  History Toby has a past medical history of Hyperlipidemia, Hypertension, Hypothyroidism, Osteoporosis, and  Vertigo (07/28/2016).   She has a past surgical history that includes Abdominal hysterectomy; Appendectomy (1974); Tonsillectomy and adenoidectomy; Eye surgery (june 2013); Tonsillectomy; Colonoscopy with propofol (N/A, 02/12/2016); and Breast biopsy (Left, 11/04/2005).   Her family history includes Cancer in her maternal grandmother and sister; Diabetes in her paternal aunt and sister; Heart disease in her father; Parkinson's disease in her mother.She reports that she has never smoked. She has never used smokeless tobacco. She reports that she does not drink alcohol or use drugs.  Outpatient Medications Prior to Visit  Medication Sig Dispense Refill  . Calcium Carbonate-Vit D-Min (CALTRATE PLUS PO) Take 1,200 mg by mouth daily.    . Cholecalciferol (VITAMIN D3) 1000 units CAPS Take 1 capsule by mouth.    . fluticasone (FLONASE) 50 MCG/ACT nasal spray Place 2 sprays into both nostrils daily. 16 g 5  . metoprolol succinate (TOPROL-XL) 25 MG 24 hr tablet TAKE 1/2 TABLET TWICE A DAY 90 tablet 1  . Multiple Vitamin (MULTIVITAMIN) tablet Take 1 tablet by mouth daily.    . pravastatin (PRAVACHOL) 20 MG tablet TAKE ONE TABLET BY MOUTH EVERY DAY 90 tablet 3  . risedronate (ACTONEL) 35 MG tablet TAKE 1 TABLET EVERY 7 DAYS WITH A FULL GLASS OF WATER ON AN EMPTY STOMACH DO NOT LIE DOWN FOR AT LEAST 30 MIN 12 tablet 0  . calcium-vitamin D (OSCAL WITH D) 500-200 MG-UNIT tablet Take 1 tablet by mouth.      No facility-administered medications prior to visit.     Review of Systems  Patient denies headache, fevers, malaise, unintentional weight loss, skin rash, eye pain, sinus congestion and sinus pain, sore throat, dysphagia,  hemoptysis , cough, dyspnea, wheezing, chest pain, palpitations, orthopnea, edema, abdominal pain, nausea, melena, diarrhea, constipation, flank pain, dysuria, hematuria, urinary  Frequency, nocturia, numbness, tingling, seizures,  Focal weakness, Loss of consciousness,  Tremor, insomnia,  depression, anxiety, and suicidal ideation.     Objective:  BP 112/66 (BP Location: Left Arm, Patient Position: Sitting, Cuff Size: Normal)   Pulse 75   Temp 98.8 F (37.1 C) (Oral)   Resp 15   Ht 5' 3.5" (1.613 m)   Wt 138 lb 1.9 oz (62.7 kg)   SpO2 97%   BMI 24.08 kg/m   Physical Exam   General appearance: alert, cooperative and appears stated age Head: Normocephalic, without obvious abnormality, atraumatic Eyes: conjunctivae/corneas clear. PERRL, EOM's intact. Fundi benign. Ears: normal TM's and external ear canals both ears Nose: Nares normal. Septum midline. Mucosa normal. No drainage or sinus tenderness. Throat: lips, mucosa, and tongue normal; teeth and gums normal Neck: no adenopathy, no carotid bruit, no JVD, supple, symmetrical, trachea midline and thyroid not enlarged, symmetric, no tenderness/mass/nodules Lungs: clear to auscultation bilaterally Breasts: normal appearance, no masses or tenderness Heart: regular rate and rhythm, S1, S2 normal, no murmur, click, rub or gallop Abdomen: soft, non-tender; bowel sounds normal; no masses,  no organomegaly Extremities: extremities normal, atraumatic, no cyanosis or edema Pulses: 2+ and symmetric Skin: Skin color, texture, turgor normal. No rashes or lesions Neurologic: Alert and oriented X 3, normal strength and tone. Normal symmetric reflexes. Normal coordination and gait.     Assessment &  Plan:   Problem List Items Addressed This Visit      Unprioritized   Tremor of left hand    Fine , mild aggravated by effort,  No workup planned given exam consistent with relative weakness of arm       Osteoporosis, post-menopausal    DEXA done Jan 2020 on actonel since  jan 2012 with one year holiday during which  t scores worsened and therapy with bisphosphonate resumed in 2015  Spine t scores worsened from 2015 to 2020 -1.6 to -2.2  Femur unchanged -2.1  Will finish the year on therapy, repeat DEXA in 2021 followed by drug  holiday for one year        Nodule of finger of right hand    Has had recurrent episodes of purulent drainage and separation of cuticle from nail  Bed by a persistent nodule (Heverden's node?) and resultant nail dystrophy . Sending to Dermatology for evaluation       RESOLVED: Hypocalcemia - Primary   Hypertension    Well controlled on metoprolol 25 mg daily.  Renal function stable, no changes today.      Hyperlipidemia with target LDL less than 100    Lipids are at goal  On pravastatin   No  change in statin therapy  is advised given her age .  Aspirin therapy discontinued given results of ASPREE trial   Lab Results  Component Value Date   CHOL 125 08/04/2018   HDL 46.70 08/04/2018   LDLCALC 56 08/04/2018   TRIG 111.0 08/04/2018   CHOLHDL 3 08/04/2018   Lab Results  Component Value Date   ALT 15 08/04/2018   AST 16 08/04/2018   ALKPHOS 59 08/04/2018   BILITOT 0.6 08/04/2018         Relevant Orders   Lipid panel (Completed)   RESOLVED: Hyperglycemia    Other Visit Diagnoses    Vitamin D deficiency       Relevant Orders   VITAMIN D 25 Hydroxy (Vit-D Deficiency, Fractures) (Completed)   Fatigue, unspecified type       Relevant Orders   Comprehensive metabolic panel (Completed)   TSH (Completed)   CBC with Differential/Platelet (Completed)   Dystrophic nail       Relevant Orders   Ambulatory referral to Dermatology   Paronychia of finger, right       Relevant Orders   Ambulatory referral to Dermatology      I am having Nonda Lou. Teigen maintain her multivitamin, Vitamin D3, fluticasone, pravastatin, metoprolol succinate, Calcium Carbonate-Vit D-Min (CALTRATE PLUS PO), and risedronate.  No orders of the defined types were placed in this encounter.   Medications Discontinued During This Encounter  Medication Reason  . calcium-vitamin D (OSCAL WITH D) 500-200 MG-UNIT tablet Duplicate    Follow-up: No follow-ups on file.   Crecencio Mc, MD

## 2018-08-05 ENCOUNTER — Encounter: Payer: Self-pay | Admitting: Internal Medicine

## 2018-08-05 DIAGNOSIS — R251 Tremor, unspecified: Secondary | ICD-10-CM | POA: Insufficient documentation

## 2018-08-05 DIAGNOSIS — R2231 Localized swelling, mass and lump, right upper limb: Secondary | ICD-10-CM | POA: Insufficient documentation

## 2018-08-05 LAB — COMPREHENSIVE METABOLIC PANEL
ALT: 15 U/L (ref 0–35)
AST: 16 U/L (ref 0–37)
Albumin: 4.2 g/dL (ref 3.5–5.2)
Alkaline Phosphatase: 59 U/L (ref 39–117)
BUN: 28 mg/dL — ABNORMAL HIGH (ref 6–23)
CO2: 29 mEq/L (ref 19–32)
Calcium: 9.5 mg/dL (ref 8.4–10.5)
Chloride: 104 mEq/L (ref 96–112)
Creatinine, Ser: 1.18 mg/dL (ref 0.40–1.20)
GFR: 43.89 mL/min — ABNORMAL LOW (ref >60.00)
Glucose, Bld: 95 mg/dL (ref 70–99)
Potassium: 3.9 mEq/L (ref 3.5–5.1)
Sodium: 140 mEq/L (ref 135–145)
Total Bilirubin: 0.6 mg/dL (ref 0.2–1.2)
Total Protein: 6.6 g/dL (ref 6.0–8.3)

## 2018-08-05 LAB — CBC WITH DIFFERENTIAL/PLATELET
Basophils Absolute: 0.1 10*3/uL (ref 0.0–0.1)
Basophils Relative: 0.8 % (ref 0.0–3.0)
Eosinophils Absolute: 0.1 10*3/uL (ref 0.0–0.7)
Eosinophils Relative: 1.1 % (ref 0.0–5.0)
HCT: 40.3 % (ref 36.0–46.0)
Hemoglobin: 13.8 g/dL (ref 12.0–15.0)
Lymphocytes Relative: 23.1 % (ref 12.0–46.0)
Lymphs Abs: 1.4 10*3/uL (ref 0.7–4.0)
MCHC: 34.3 g/dL (ref 30.0–36.0)
MCV: 98.6 fl (ref 78.0–100.0)
Monocytes Absolute: 0.4 10*3/uL (ref 0.1–1.0)
Monocytes Relative: 5.8 % (ref 3.0–12.0)
Neutro Abs: 4.3 10*3/uL (ref 1.4–7.7)
Neutrophils Relative %: 69.2 % (ref 43.0–77.0)
Platelets: 143 10*3/uL — ABNORMAL LOW (ref 150.0–400.0)
RBC: 4.09 Mil/uL (ref 3.87–5.11)
RDW: 12.2 % (ref 11.5–15.5)
WBC: 6.3 10*3/uL (ref 4.0–10.5)

## 2018-08-05 LAB — LIPID PANEL
Cholesterol: 125 mg/dL (ref 0–200)
HDL: 46.7 mg/dL (ref >39.00)
LDL Cholesterol: 56 mg/dL (ref 0–99)
NonHDL: 78.07
Total CHOL/HDL Ratio: 3
Triglycerides: 111 mg/dL (ref 0.0–149.0)
VLDL: 22.2 mg/dL (ref 0.0–40.0)

## 2018-08-05 LAB — VITAMIN D 25 HYDROXY (VIT D DEFICIENCY, FRACTURES): VITD: 46.97 ng/mL (ref 30.00–100.00)

## 2018-08-05 LAB — TSH: TSH: 2.3 u[IU]/mL (ref 0.35–4.50)

## 2018-08-05 NOTE — Assessment & Plan Note (Signed)
Fine , mild aggravated by effort,  No workup planned given exam consistent with relative weakness of arm

## 2018-08-05 NOTE — Assessment & Plan Note (Signed)
DEXA done Jan 2020 on actonel since  jan 2012 with one year holiday during which  t scores worsened and therapy with bisphosphonate resumed in 2015  Spine t scores worsened from 2015 to 2020 -1.6 to -2.2  Femur unchanged -2.1  Will finish the year on therapy, repeat DEXA in 2021 followed by drug holiday for one year

## 2018-08-05 NOTE — Assessment & Plan Note (Signed)
Has had recurrent episodes of purulent drainage and separation of cuticle from nail  Bed by a persistent nodule (Heverden's node?) and resultant nail dystrophy . Sending to Dermatology for evaluation

## 2018-08-05 NOTE — Assessment & Plan Note (Signed)
Lipids are at goal  On pravastatin   No  change in statin therapy  is advised given her age .  Aspirin therapy discontinued given results of ASPREE trial   Lab Results  Component Value Date   CHOL 125 08/04/2018   HDL 46.70 08/04/2018   LDLCALC 56 08/04/2018   TRIG 111.0 08/04/2018   CHOLHDL 3 08/04/2018   Lab Results  Component Value Date   ALT 15 08/04/2018   AST 16 08/04/2018   ALKPHOS 59 08/04/2018   BILITOT 0.6 08/04/2018

## 2018-08-05 NOTE — Assessment & Plan Note (Addendum)
Well controlled on metoprolol 25 mg daily.  Renal function stable, no changes today.

## 2018-08-07 ENCOUNTER — Other Ambulatory Visit: Payer: Self-pay | Admitting: Internal Medicine

## 2018-08-07 DIAGNOSIS — R944 Abnormal results of kidney function studies: Secondary | ICD-10-CM

## 2018-08-10 DIAGNOSIS — L821 Other seborrheic keratosis: Secondary | ICD-10-CM | POA: Diagnosis not present

## 2018-08-10 DIAGNOSIS — M71341 Other bursal cyst, right hand: Secondary | ICD-10-CM | POA: Diagnosis not present

## 2018-08-28 ENCOUNTER — Other Ambulatory Visit: Payer: Self-pay | Admitting: Internal Medicine

## 2018-09-10 ENCOUNTER — Other Ambulatory Visit: Payer: Self-pay

## 2018-09-10 ENCOUNTER — Other Ambulatory Visit (INDEPENDENT_AMBULATORY_CARE_PROVIDER_SITE_OTHER): Payer: Medicare Other

## 2018-09-10 DIAGNOSIS — R944 Abnormal results of kidney function studies: Secondary | ICD-10-CM

## 2018-09-10 LAB — BASIC METABOLIC PANEL
BUN: 22 mg/dL (ref 6–23)
CO2: 29 mEq/L (ref 19–32)
Calcium: 8.9 mg/dL (ref 8.4–10.5)
Chloride: 106 mEq/L (ref 96–112)
Creatinine, Ser: 1.06 mg/dL (ref 0.40–1.20)
GFR: 49.67 mL/min — ABNORMAL LOW (ref >60.00)
Glucose, Bld: 96 mg/dL (ref 70–99)
Potassium: 4.1 mEq/L (ref 3.5–5.1)
Sodium: 141 mEq/L (ref 135–145)

## 2018-09-13 ENCOUNTER — Other Ambulatory Visit: Payer: Self-pay | Admitting: Internal Medicine

## 2018-09-13 DIAGNOSIS — N182 Chronic kidney disease, stage 2 (mild): Secondary | ICD-10-CM

## 2018-10-12 ENCOUNTER — Other Ambulatory Visit: Payer: Self-pay

## 2018-10-12 ENCOUNTER — Ambulatory Visit (INDEPENDENT_AMBULATORY_CARE_PROVIDER_SITE_OTHER): Payer: Medicare Other

## 2018-10-12 DIAGNOSIS — Z23 Encounter for immunization: Secondary | ICD-10-CM

## 2018-11-10 DIAGNOSIS — H2511 Age-related nuclear cataract, right eye: Secondary | ICD-10-CM | POA: Diagnosis not present

## 2018-12-10 ENCOUNTER — Other Ambulatory Visit: Payer: Self-pay

## 2018-12-11 ENCOUNTER — Telehealth: Payer: Self-pay | Admitting: *Deleted

## 2018-12-11 NOTE — Telephone Encounter (Signed)
Please place future orders for lab appt.  

## 2018-12-12 NOTE — Telephone Encounter (Signed)
Renal function panel was ordered in August  For 3 month follow up.  Can you not see it?

## 2018-12-14 ENCOUNTER — Other Ambulatory Visit (INDEPENDENT_AMBULATORY_CARE_PROVIDER_SITE_OTHER): Payer: Medicare Other

## 2018-12-14 ENCOUNTER — Other Ambulatory Visit: Payer: Self-pay

## 2018-12-14 DIAGNOSIS — N182 Chronic kidney disease, stage 2 (mild): Secondary | ICD-10-CM

## 2018-12-14 LAB — RENAL FUNCTION PANEL
Albumin: 3.9 g/dL (ref 3.5–5.2)
BUN: 18 mg/dL (ref 6–23)
CO2: 32 mEq/L (ref 19–32)
Calcium: 9 mg/dL (ref 8.4–10.5)
Chloride: 105 mEq/L (ref 96–112)
Creatinine, Ser: 0.96 mg/dL (ref 0.40–1.20)
GFR: 55.65 mL/min — ABNORMAL LOW (ref >60.00)
Glucose, Bld: 93 mg/dL (ref 70–99)
Phosphorus: 2.6 mg/dL (ref 2.3–4.6)
Potassium: 4.2 mEq/L (ref 3.5–5.1)
Sodium: 141 mEq/L (ref 135–145)

## 2018-12-14 NOTE — Telephone Encounter (Signed)
No, it was not ordered future, it was ordered active

## 2018-12-14 NOTE — Telephone Encounter (Signed)
I have reordered it for timesake, but Tina Silva will not be able to starting Friday,

## 2019-02-16 ENCOUNTER — Telehealth: Payer: Self-pay | Admitting: Internal Medicine

## 2019-02-16 NOTE — Telephone Encounter (Signed)
Pt would like a call back to discuss blood pressure readings.

## 2019-02-16 NOTE — Telephone Encounter (Signed)
Spoke with pt and she stated that on Sunday after she ate lunch she got very dizzy and had to lay down on the floor. She stated that she got a Meclizine from one of her friends and it seemed to help with the dizziness but her blood pressure was up to 141/73 with a heart in the 60s. Pt stated that blood pressure did come down and she did not have any trouble yesterday at all. Today after eating lunch she had a dizzy spell again and her blood pressure was 142/74 pulse 71, 149/71 and then went down to 131/76. When it went down the dizziness went away. She stated that when she walks just down her hallway she has noticed that her bp spikes up. She also stated that she received her first covid vaccine on 02/02/2019 and wasn't sure if it had something to do with that. Pt has been scheduled for a telephone visit with you tomorrow at 11:30.

## 2019-02-17 ENCOUNTER — Ambulatory Visit (INDEPENDENT_AMBULATORY_CARE_PROVIDER_SITE_OTHER): Payer: Medicare Other | Admitting: Internal Medicine

## 2019-02-17 ENCOUNTER — Other Ambulatory Visit: Payer: Self-pay

## 2019-02-17 DIAGNOSIS — I10 Essential (primary) hypertension: Secondary | ICD-10-CM

## 2019-02-17 DIAGNOSIS — H81313 Aural vertigo, bilateral: Secondary | ICD-10-CM | POA: Diagnosis not present

## 2019-02-17 MED ORDER — FLUTICASONE PROPIONATE 50 MCG/ACT NA SUSP: 2.0000 | Freq: Every day | NASAL | 11 refills | Status: DC

## 2019-02-17 MED ORDER — MECLIZINE HCL 25 MG PO TABS: 25.0000 mg | ORAL_TABLET | Freq: Three times a day (TID) | ORAL | 2 refills | Status: DC | PRN

## 2019-02-17 NOTE — Progress Notes (Signed)
Telephone Note  This visit type was conducted due to national recommendations for restrictions regarding the COVID-19 pandemic (e.g. social distancing).  This format is felt to be most appropriate for this patient at this time.  All issues noted in this document were discussed and addressed.  No physical exam was performed (except for noted visual exam findings with Video Visits).   I connected with@ on 02/17/19 at 11:30 AM EST by  telephone and verified that I am speaking with the correct person using two identifiers. Location patient: home Location provider: work or home office Persons participating in the virtual visit: patient, provider  I discussed the limitations, risks, security and privacy concerns of performing an evaluation and management service by telephone and the availability of in person appointments. I also discussed with the patient that there may be a patient responsible charge related to this service. The patient expressed understanding and agreed to proceed.  Reason for visit: headache, hypertension   HPI:  85 yr old female with history of hypertension and osteoporosis presents with several episodes of vertigo accompanied by mildly elevated BP to [id number]  Systolic.  The last episode  resolved 1 hour after taking 25 mg meclizine . symptoms have recurred each time after wearing a mask, (church, going to pick up food).  She  Has worn several types of masks:  both thin blue masks, and the N95 knock off, and the symptoms are brought on with use of either.  She does note that her symptoms  Or vertigo started after receiving the FIRST covid 19 vaccine.  She HAS NO KNOWN  History of  COVID exposure,   And denies  fever,  Headache,  Sinus drainage, body aches ,  facial pain , and cough.  HTN:  Patient is taking her medications as prescribed and notes no adverse effects.  Home BP readings have been done  More frequently and have been elevated only when she is feeling poorly,  Otherwise her  readings have been < 130/80 .  She is avoiding added salt in her diet and walking regularly about 3 times per week for exercise  .Marland Kitchen     ROS: See pertinent positives and negatives per HPI.  Past Medical History:  Diagnosis Date  . Hyperlipidemia   . Hypertension   . Hypothyroidism   . Osteoporosis   . Vertigo 07/28/2016    Past Surgical History:  Procedure Laterality Date  . ABDOMINAL HYSTERECTOMY    . APPENDECTOMY  1974  . BREAST BIOPSY Left 11/04/2005   core  . COLONOSCOPY WITH PROPOFOL N/A 02/12/2016   Procedure: COLONOSCOPY WITH PROPOFOL;  Surgeon: Manya Silvas, MD;  [facility] ENDOSCOPY;  Service: Endoscopy;  Laterality: N/A;  . EYE SURGERY  june 2013   cataract with lens,  dinglein left eye   . TONSILLECTOMY    . TONSILLECTOMY AND ADENOIDECTOMY      Family History  Problem Relation Age of Onset  . Parkinson's disease Mother   . Cancer Sister        AML  . Diabetes Sister   . Heart disease Father   . Diabetes Paternal Aunt   . Cancer Maternal Grandmother   . Breast cancer Neg Hx     SOCIAL HX:  reports that she has never smoked. She has never used smokeless tobacco. She reports that she does not drink alcohol or use drugs.   Current Outpatient Medications:  .  Calcium Carbonate-Vit D-Min (CALTRATE PLUS PO), Take 1,200 mg by mouth  daily., Disp: , Rfl:  .  Cholecalciferol (VITAMIN D3) 1000 units CAPS, Take 1 capsule by mouth., Disp: , Rfl:  .  fluticasone (FLONASE) 50 MCG/ACT nasal spray, Place 2 sprays into both nostrils daily. Marland KitchenKEEP ON FILE FOR FUTURE REFILLS, Disp: 16 g, Rfl: 11 .  metoprolol succinate (TOPROL-XL) 25 MG 24 hr tablet, TAKE 1/2 TABLET TWICE DAILY, Disp: 90 tablet, Rfl: 1 .  Multiple Vitamin (MULTIVITAMIN) tablet, Take 1 tablet by mouth daily., Disp: , Rfl:  .  pravastatin (PRAVACHOL) 20 MG tablet, TAKE ONE TABLET BY MOUTH EVERY DAY, Disp: 90 tablet, Rfl: 3 .  risedronate (ACTONEL) 35 MG tablet, TAKE 1 TABLET EVERY 7 DAYS WITH A FULL GLASS OF  WATER ON AN EMPTY STOMACH DO NOT LIE DOWN FOR AT LEAST 30 MIN, Disp: 12 tablet, Rfl: 0 .  meclizine (ANTIVERT) 25 MG tablet, Take 1 tablet (25 mg total) by mouth 3 (three) times daily as needed for dizziness., Disp: 30 tablet, Rfl: 2  EXAM:   General impression: alert, cooperative.  No signs of being in distress  Lungs: speech is fluent sentence length suggests that patient is not short of breath and not punctuated by cough, sneezing or sniffing. Marland Kitchen   Psych: affect normal.  speech is articulate and non pressured .  Neuro:  awake and interactive with normal mood and affect. Higher cortical functions are normal. Speech is clear without word-finding difficulty or dysarthria.     ASSESSMENT AND PLAN:  Discussed the following assessment and plan:  Vertigo, aural, bilateral  Essential hypertension  Vertigo, aural, bilateral Likely a side effect of COVID 19 vaccine vs Eustachian tube dysfunction.  Daily flonase and meclizine prn  advised. Avoid steroids given recent vaccination  Hypertension Well controlled on current regimen based on readings taken at home when NOT in distress.  reassurance given . Renal function stable, no changes today.    I discussed the assessment and treatment plan with the patient. The patient was provided an opportunity to ask questions and all were answered. The patient agreed with the plan and demonstrated an understanding of the instructions.   The patient was advised to call back or seek an in-person evaluation if the symptoms worsen or if the condition fails to improve as anticipated.   I provided  22 minutes of non-face-to-face time during this encounter reviewing patient's current problems and past procedures/imaging studies, providing counseling on the above mentioned problems , and coordination  of care .   Crecencio Mc, MD

## 2019-02-21 DIAGNOSIS — H81313 Aural vertigo, bilateral: Secondary | ICD-10-CM | POA: Insufficient documentation

## 2019-02-21 NOTE — Assessment & Plan Note (Signed)
Well controlled on current regimen based on readings taken at home when NOT in distress.  reassurance given . Renal function stable, no changes today.

## 2019-02-21 NOTE — Assessment & Plan Note (Signed)
Likely a side effect of COVID 19 vaccine vs Eustachian tube dysfunction.  Daily flonase and meclizine prn  advised. Avoid steroids given recent vaccination

## 2019-02-26 ENCOUNTER — Other Ambulatory Visit: Payer: Self-pay | Admitting: Internal Medicine

## 2019-03-02 DIAGNOSIS — H8112 Benign paroxysmal vertigo, left ear: Secondary | ICD-10-CM | POA: Diagnosis not present

## 2019-03-09 DIAGNOSIS — H8112 Benign paroxysmal vertigo, left ear: Secondary | ICD-10-CM | POA: Diagnosis not present

## 2019-03-15 ENCOUNTER — Other Ambulatory Visit: Payer: Self-pay

## 2019-03-15 ENCOUNTER — Encounter: Payer: Self-pay | Admitting: Internal Medicine

## 2019-03-15 ENCOUNTER — Ambulatory Visit (INDEPENDENT_AMBULATORY_CARE_PROVIDER_SITE_OTHER): Payer: Medicare Other | Admitting: Internal Medicine

## 2019-03-15 DIAGNOSIS — H81313 Aural vertigo, bilateral: Secondary | ICD-10-CM | POA: Diagnosis not present

## 2019-03-15 NOTE — Progress Notes (Signed)
Pt stated that this morning when she was having a dizzy spell she checked her blood pressure and it was 129/64 pulse 71. Pt stated that when she gets out of the bed in the mornings is when her dizzy spells occur. Pt stated that when she has the dizzy spells she always tends to go to the left. Pt stated that she saw Dr. Richardson Landry 03/02/2019 and he stated that there was some crystals in her left ear. She then went back for a check up on 03/09/2019 and the provider she saw then stated that everything looked good. Pt also stated that she took a Meclizine on Sunday but it did not help with the dizzy spell.

## 2019-03-15 NOTE — Progress Notes (Signed)
Telephone Note  This visit type was conducted due to national recommendations for restrictions regarding the COVID-19 pandemic (e.g. social distancing).  This format is felt to be most appropriate for this patient at this time.  All issues noted in this document were discussed and addressed.  No physical exam was performed (except for noted visual exam findings with Video Visits).   I connected with@ on 03/15/19 at  4:30 PM EST by  telephone and verified that I am speaking with the correct person using two identifiers. Location patient: home Location provider: work or home office Persons participating in the virtual visit: patient, provider  I discussed the limitations, risks, security and privacy concerns of performing an evaluation and management service by telephone and the availability of in person appointments. I also discussed with the patient that there may be a patient responsible charge related to this service. The patient expressed understanding and agreed to proceed.   Reason for visit: recurrent dizzy spells   HPI:  85 yr old female with hypertension left hand tremor, presents with recurrent vertigo first reported in early January,  Previously attributed to hyperventilation caused by wearing the cloth masks. However currently she reports that her dizzy spells occur in the morning , after  she gets out of bed.  Has checked BP and readings have been > 120/70 and < 150/90.  Denies palpitations, chest pain,  Dyspnea and nausea.  States that the episodes make her veer to the left when she is walking .  Meclizine did not help.  Episode last  over an hour.   She was evaluated by ENT twice for same.  Initial visit : hallpike maneuver elicited nystagmusin the left eye.   2nd visit   No nystagmus noted during evaluation (symptoms had resolved).  She is currently  Not using flonase or any topical decongestnat but does report rhinits and occasional congestion      ROS: See pertinent positives  and negatives per HPI.  Past Medical History:  Diagnosis Date  . Hyperlipidemia   . Hypertension   . Hypothyroidism   . Osteoporosis   . Vertigo 07/28/2016    Past Surgical History:  Procedure Laterality Date  . ABDOMINAL HYSTERECTOMY    . APPENDECTOMY  1974  . BREAST BIOPSY Left 11/04/2005   core  . COLONOSCOPY WITH PROPOFOL N/A 02/12/2016   Procedure: COLONOSCOPY WITH PROPOFOL;  Surgeon: Manya Silvas, MD;  Location: [facility] ENDOSCOPY;  Service: Endoscopy;  Laterality: N/A;  . EYE SURGERY  june 2013   cataract with lens,  dinglein left eye   . TONSILLECTOMY    . TONSILLECTOMY AND ADENOIDECTOMY      Family History  Problem Relation Age of Onset  . Parkinson's disease Mother   . Cancer Sister        AML  . Diabetes Sister   . Heart disease Father   . Diabetes Paternal Aunt   . Cancer Maternal Grandmother   . Breast cancer Neg Hx     SOCIAL HX:  reports that she has never smoked. She has never used smokeless tobacco. She reports that she does not drink alcohol or use drugs.   Current Outpatient Medications:  .  Calcium Carbonate-Vit D-Min (CALTRATE PLUS PO), Take 1,200 mg by mouth daily., Disp: , Rfl:  .  Cholecalciferol (VITAMIN D3) 1000 units CAPS, Take 1 capsule by mouth., Disp: , Rfl:  .  fluticasone (FLONASE) 50 MCG/ACT nasal spray, Place 2 sprays into both nostrils daily. Marland KitchenKEEP ON FILE  FOR FUTURE REFILLS, Disp: 16 g, Rfl: 11 .  meclizine (ANTIVERT) 25 MG tablet, Take 1 tablet (25 mg total) by mouth 3 (three) times daily as needed for dizziness., Disp: 30 tablet, Rfl: 2 .  metoprolol succinate (TOPROL-XL) 25 MG 24 hr tablet, TAKE ONE-HALF TABLET BY MOUTH TWICE DAILY, Disp: 90 tablet, Rfl: 1 .  Multiple Vitamin (MULTIVITAMIN) tablet, Take 1 tablet by mouth daily., Disp: , Rfl:  .  pravastatin (PRAVACHOL) 20 MG tablet, TAKE 1 TABLET BY MOUTH DAILY, Disp: 90 tablet, Rfl: 3  EXAM:   General impression: alert, cooperative and articulate.  No signs of being in distress   Lungs: speech is fluent sentence length suggests that patient is not short of breath and not punctuated by cough, sneezing or sniffing. Marland Kitchen   Psych: affect normal.  speech is articulate and non pressured .  Denies suicidal thoughts   ASSESSMENT AND PLAN:  Discussed the following assessment and plan:  Vertigo, aural, bilateral  Vertigo, aural, bilateral Recurrent intermittent,  Appears to be brought on by position change,  Does not accompany headaches,  Change in vision or confusion.   Trial of flonase,  Head og bed elevation with wedge pillow,  And Afrin nasal spray     I discussed the assessment and treatment plan with the patient. The patient was provided an opportunity to ask questions and all were answered. The patient agreed with the plan and demonstrated an understanding of the instructions.   The patient was advised to call back or seek an in-person evaluation if the symptoms worsen or if the condition fails to improve as anticipated.  I provided  25 minutes of non-face-to-face time during this encounter reviewing patient's current problems and past procedures/imaging studies, providing counseling on the above mentioned problems , and coordination  of care . Crecencio Mc, MD

## 2019-03-16 NOTE — Assessment & Plan Note (Signed)
Recurrent intermittent,  Appears to be brought on by position change,  Does not accompany headaches,  Change in vision or confusion.   Trial of flonase,  Head og bed elevation with wedge pillow,  And Afrin nasal spray

## 2019-04-07 ENCOUNTER — Ambulatory Visit: Payer: Medicare Other | Admitting: Internal Medicine

## 2019-04-07 ENCOUNTER — Other Ambulatory Visit: Payer: Self-pay

## 2019-04-07 ENCOUNTER — Ambulatory Visit (INDEPENDENT_AMBULATORY_CARE_PROVIDER_SITE_OTHER): Payer: Medicare Other

## 2019-04-07 VITALS — BP 122/64 | HR 65 | Ht 63.0 in | Wt 139.0 lb

## 2019-04-07 DIAGNOSIS — Z0000 Encounter for general adult medical examination without abnormal findings: Secondary | ICD-10-CM

## 2019-04-07 NOTE — Patient Instructions (Addendum)
  Ms. Bunting , Thank you for taking time to come for your Medicare Wellness Visit. I appreciate your ongoing commitment to your health goals. Please review the following plan we discussed and let me know if I can assist you in the future.   These are the goals we discussed: Goals      Patient Stated   . Maintain Healthy Lifestyle (pt-stated)     Stay active  Stay hydrated  Healthy diet       This is a list of the screening recommended for you and due dates:  Health Maintenance  Topic Date Due  . Mammogram  07/20/2019  . Tetanus Vaccine  12/29/2021  . Flu Shot  Completed  . DEXA scan (bone density measurement)  Completed  . Pneumonia vaccines  Completed

## 2019-04-07 NOTE — Progress Notes (Addendum)
Subjective:   Tina Silva is a 85 y.o. female who presents for Medicare Annual (Subsequent) preventive examination.  Review of Systems:  No ROS.  Medicare Wellness Virtual Visit.  Visual/audio telehealth visit, UTA vital signs.   Ht/Wt provided. See social history for additional risk factors.   Cardiac Risk Factors include: advanced age (>49men, >44 women);hypertension     Objective:     Vitals: BP 122/64 (BP Location: Left Arm, Patient Position: Sitting, Cuff Size: Normal) Comment: Deferred due to audio visit  Pulse 65   Ht 5\' 3"  (1.6 m)   Wt 139 lb (63 kg)   BMI 24.62 kg/m   Body mass index is 24.62 kg/m.  Advanced Directives 04/07/2019 04/03/2018 04/01/2017 07/28/2016 07/28/2016 07/03/2016 03/26/2016  Does Patient Have a Medical Advance Directive? Yes Yes Yes Yes No Yes Yes  Type of Paramedic of [place];Living will [place];Living will [place];Living will [place];Living will - [place];Living will [place];Living will  Does patient want to make changes to medical advance directive? No - Patient declined - No - Patient declined No - Patient declined - No - Patient declined No - Patient declined  Copy of [place] in Chart? Yes - validated most recent copy scanned in chart (See row information) Yes - validated most recent copy scanned in chart (See row information) No - copy requested No - copy requested - - Yes  Would patient like information on creating a medical advance directive? - - - No - Patient declined No - Patient declined - -    Tobacco Social History   Tobacco Use  Smoking Status Never Smoker  Smokeless Tobacco Never Used     Counseling given: Not Answered   Clinical Intake:  Pre-visit preparation completed: Yes        Diabetes: No  How often do you need to have someone help you when you read instructions,  pamphlets, or other written materials from your doctor or pharmacy?: 1 - Never  Interpreter Needed?: No     Past Medical History:  Diagnosis Date  . Hyperlipidemia   . Hypertension   . Hypothyroidism   . Osteoporosis   . Vertigo 07/28/2016   Past Surgical History:  Procedure Laterality Date  . ABDOMINAL HYSTERECTOMY    . APPENDECTOMY  1974  . BREAST BIOPSY Left 11/04/2005   core  . COLONOSCOPY WITH PROPOFOL N/A 02/12/2016   Procedure: COLONOSCOPY WITH PROPOFOL;  Surgeon: Manya Silvas, MD;  Location: [facility] ENDOSCOPY;  Service: Endoscopy;  Laterality: N/A;  . EYE SURGERY  june 2013   cataract with lens,  dinglein left eye   . TONSILLECTOMY    . TONSILLECTOMY AND ADENOIDECTOMY     Family History  Problem Relation Age of Onset  . Parkinson's disease Mother   . Cancer Sister        AML  . Diabetes Sister   . Heart disease Father   . Diabetes Paternal Aunt   . Cancer Maternal Grandmother   . Breast cancer Neg Hx    Social History   Socioeconomic History  . Marital status: Single    Spouse name: Not on file  . Number of children: Not on file  . Years of education: Not on file  . Highest education level: Not on file  Occupational History  . Not on file  Tobacco Use  . Smoking status: Never Smoker  . Smokeless  tobacco: Never Used  Substance and Sexual Activity  . Alcohol use: No  . Drug use: No  . Sexual activity: Never  Other Topics Concern  . Not on file  Social History Narrative   Independent at baseline   Social Determinants of Health   Financial Resource Strain:   . Difficulty of Paying Living Expenses: Not on file  Food Insecurity:   . Worried About Charity fundraiser in the Last Year: Not on file  . Ran Out of Food in the Last Year: Not on file  Transportation Needs:   . Lack of Transportation (Medical): Not on file  . Lack of Transportation (Non-Medical): Not on file  Physical Activity:   . Days of Exercise per Week: Not on file  . Minutes of  Exercise per Session: Not on file  Stress:   . Feeling of Stress : Not on file  Social Connections:   . Frequency of Communication with Friends and Family: Not on file  . Frequency of Social Gatherings with Friends and Family: Not on file  . Attends Religious Services: Not on file  . Active Member of Clubs or Organizations: Not on file  . Attends Archivist Meetings: Not on file  . Marital Status: Not on file    Outpatient Encounter Medications as of 04/07/2019  Medication Sig  . Calcium Carbonate-Vit D-Min (CALTRATE PLUS PO) Take 1,200 mg by mouth daily.  . Cholecalciferol (VITAMIN D3) 1000 units CAPS Take 1 capsule by mouth.  . fluticasone (FLONASE) 50 MCG/ACT nasal spray Place 2 sprays into both nostrils daily. Marland KitchenKEEP ON FILE FOR FUTURE REFILLS  . meclizine (ANTIVERT) 25 MG tablet Take 1 tablet (25 mg total) by mouth 3 (three) times daily as needed for dizziness.  . metoprolol succinate (TOPROL-XL) 25 MG 24 hr tablet TAKE ONE-HALF TABLET BY MOUTH TWICE DAILY  . Multiple Vitamin (MULTIVITAMIN) tablet Take 1 tablet by mouth daily.  . pravastatin (PRAVACHOL) 20 MG tablet TAKE 1 TABLET BY MOUTH DAILY   No facility-administered encounter medications on file as of 04/07/2019.    Activities of Daily Living In your present state of health, do you have any difficulty performing the following activities: 04/07/2019  Hearing? N  Vision? N  Difficulty concentrating or making decisions? N  Walking or climbing stairs? N  Dressing or bathing? N  Doing errands, shopping? N  Preparing Food and eating ? N  Using the Toilet? N  In the past six months, have you accidently leaked urine? N  Do you have problems with loss of bowel control? N  Managing your Medications? N  Managing your Finances? N  Housekeeping or managing your Housekeeping? N  Some recent data might be hidden    Patient Care Team: Crecencio Mc, MD as PCP - General (Internal Medicine)    Assessment:   This is a  routine wellness examination for Tina Silva.  Nurse connected with patient 04/07/19 at  9:00 AM EST by a telephone enabled telemedicine application and verified that I am speaking with the correct person using two identifiers. Patient stated full name and DOB. Patient gave permission to continue with virtual visit. Patient's location was at home and Nurse's location was at [place] office.   Patient is alert and oriented x3. Patient denies difficulty focusing or concentrating. Patient likes to work jigsaw puzzles for brain stimulation.   Health Maintenance Due: See completed HM at the end of note.   Eye: Visual acuity not assessed. Virtual visit. Followed by  their ophthalmologist.  Dental: Visits every 6 months.    Hearing: Demonstrates normal hearing during visit.  Safety:  Patient feels safe at home- yes Patient does have smoke detectors at home- yes Patient does wear sunscreen or protective clothing when in direct sunlight - yes Patient does wear seat belt when in a moving vehicle - yes Patient drives- yes Adequate lighting in walkways free from debris- yes Grab bars and handrails used as appropriate- yes Ambulates with an assistive device- no Cell phone on person when ambulating outside of the home- yes  Social: Alcohol intake - no  Smoking history- never   Smokers in home? none Illicit drug use? none  Medication: Taking as directed and without issues.  Self managed - yes   Covid-19: Precautions and sickness symptoms discussed. Wears mask, social distancing, hand hygiene as appropriate.   Activities of Daily Living Patient denies needing assistance with: household chores, feeding themselves, getting from bed to chair, getting to the toilet, bathing/showering, dressing, managing money, or preparing meals.   Discussed the importance of a healthy diet, water intake and the benefits of aerobic exercise.   Physical activity- treadmill, walking  Diet:  Regular Water: good  intake Caffeine: none  Other Providers Patient Care Team: Crecencio Mc, MD as PCP - General (Internal Medicine)  Exercise Activities and Dietary recommendations Current Exercise Habits: Home exercise routine, Type of exercise: treadmill;walking, Frequency (Times/Week): 3, Intensity: Mild  Goals      Patient Stated   . Maintain Healthy Lifestyle (pt-stated)     Stay active  Stay hydrated  Healthy diet       Fall Risk Fall Risk  04/07/2019 03/15/2019 02/17/2019 04/03/2018 04/01/2017  Falls in the past year? 0 0 0 0 No  Number falls in past yr: - - 0 - -  Injury with Fall? - - 0 - -  Follow up Falls evaluation completed Falls evaluation completed Falls evaluation completed - -   Timed Get Up and Go performed: no, virtual visit  Depression Screen PHQ 2/9 Scores 04/07/2019 02/17/2019 04/03/2018 04/01/2017  PHQ - 2 Score 0 0 0 0  PHQ- 9 Score 0 0 - -     Cognitive Function MMSE - Mini Mental State Exam 04/01/2017 03/26/2016 03/14/2015  Orientation to time 5 5 5   Orientation to Place 5 5 5   Registration 3 3 3   Attention/ Calculation 5 5 5   Recall 3 3 3   Language- name 2 objects 2 2 2   Language- repeat 1 1 1   Language- follow 3 step command 3 3 3   Language- read & follow direction 1 1 1   Write a sentence 1 1 1   Copy design 1 1 1   Total score 30 30 30      6CIT Screen 04/07/2019 04/03/2018  What Year? 0 points 0 points  What month? 0 points 0 points  What time? 0 points 0 points  Count back from 20 - 0 points  Months in reverse - 0 points  Repeat phrase - 0 points  Total Score - 0    Immunization History  Administered Date(s) Administered  . Fluad Quad(high Dose 65+) 10/12/2018  . Influenza Whole 10/22/2011  . Influenza, High Dose Seasonal PF 10/18/2016, 10/21/2017  . Influenza-Unspecified 11/05/2012, 10/11/2013, 10/06/2014, 10/15/2015  . PFIZER SARS-COV-2 Vaccination 02/02/2019, 02/23/2019  . Pneumococcal Conjugate-13 10/12/2013  . Pneumococcal Polysaccharide-23  07/15/2012, 01/02/2018  . Tdap 12/30/2011  . Zoster 07/22/2011  . Zoster Recombinat (Shingrix) 10/07/2017, 12/09/2017   Screening Tests Health Maintenance  Topic Date Due  . MAMMOGRAM  07/20/2019  . TETANUS/TDAP  12/29/2021  . INFLUENZA VACCINE  Completed  . DEXA SCAN  Completed  . PNA vac Low Risk Adult  Completed       Plan:   Keep all routine maintenance appointments.   Medicare Attestation I have personally reviewed: The patient's medical and social history Their use of alcohol, tobacco or illicit drugs Their current medications and supplements The patient's functional ability including ADLs,fall risks, home safety risks, cognitive, and hearing and visual impairment Diet and physical activities Evidence for depression   I have reviewed and discussed with patient certain preventive protocols, quality metrics, and best practice recommendations.     OBrien-Blaney, Wrenley Sayed L, LPN  [id number]   I have reviewed the above information and agree with above.   Deborra Medina, MD

## 2019-05-25 ENCOUNTER — Other Ambulatory Visit: Payer: Self-pay | Admitting: Internal Medicine

## 2019-06-07 ENCOUNTER — Other Ambulatory Visit: Payer: Self-pay | Admitting: Internal Medicine

## 2019-06-07 DIAGNOSIS — Z1231 Encounter for screening mammogram for malignant neoplasm of breast: Secondary | ICD-10-CM

## 2019-07-21 ENCOUNTER — Ambulatory Visit
Admission: RE | Admit: 2019-07-21 | Discharge: 2019-07-21 | Disposition: A | Discharge status: Home / Self Care | Payer: Medicare Other | Source: Ambulatory Visit | Attending: Internal Medicine | Admitting: Internal Medicine

## 2019-07-21 DIAGNOSIS — Z1231 Encounter for screening mammogram for malignant neoplasm of breast: Secondary | ICD-10-CM

## 2019-08-06 ENCOUNTER — Ambulatory Visit (INDEPENDENT_AMBULATORY_CARE_PROVIDER_SITE_OTHER): Payer: Medicare Other | Admitting: Internal Medicine

## 2019-08-06 ENCOUNTER — Encounter: Payer: Self-pay | Admitting: Internal Medicine

## 2019-08-06 ENCOUNTER — Other Ambulatory Visit: Payer: Self-pay

## 2019-08-06 ENCOUNTER — Ambulatory Visit (INDEPENDENT_AMBULATORY_CARE_PROVIDER_SITE_OTHER): Payer: Medicare Other

## 2019-08-06 VITALS — BP 130/74 | HR 69 | Temp 97.6°F | Resp 15 | Ht 63.0 in | Wt 137.6 lb

## 2019-08-06 DIAGNOSIS — I10 Essential (primary) hypertension: Secondary | ICD-10-CM

## 2019-08-06 DIAGNOSIS — M5412 Radiculopathy, cervical region: Secondary | ICD-10-CM | POA: Diagnosis not present

## 2019-08-06 DIAGNOSIS — R2231 Localized swelling, mass and lump, right upper limb: Secondary | ICD-10-CM

## 2019-08-06 DIAGNOSIS — E785 Hyperlipidemia, unspecified: Secondary | ICD-10-CM

## 2019-08-06 DIAGNOSIS — H81313 Aural vertigo, bilateral: Secondary | ICD-10-CM

## 2019-08-06 DIAGNOSIS — M542 Cervicalgia: Secondary | ICD-10-CM

## 2019-08-06 LAB — COMPREHENSIVE METABOLIC PANEL
ALT: 14 U/L (ref 0–35)
AST: 17 U/L (ref 0–37)
Albumin: 4.4 g/dL (ref 3.5–5.2)
Alkaline Phosphatase: 61 U/L (ref 39–117)
BUN: 21 mg/dL (ref 6–23)
CO2: 34 mEq/L — ABNORMAL HIGH (ref 19–32)
Calcium: 9.7 mg/dL (ref 8.4–10.5)
Chloride: 103 mEq/L (ref 96–112)
Creatinine, Ser: 0.91 mg/dL (ref 0.40–1.20)
GFR: 59.1 mL/min — ABNORMAL LOW (ref >60.00)
Glucose, Bld: 95 mg/dL (ref 70–99)
Potassium: 4.2 mEq/L (ref 3.5–5.1)
Sodium: 141 mEq/L (ref 135–145)
Total Bilirubin: 0.7 mg/dL (ref 0.2–1.2)
Total Protein: 6.2 g/dL (ref 6.0–8.3)

## 2019-08-06 LAB — LIPID PANEL
Cholesterol: 120 mg/dL (ref 0–200)
HDL: 45.9 mg/dL (ref >39.00)
LDL Cholesterol: 49 mg/dL (ref 0–99)
NonHDL: 74.23
Total CHOL/HDL Ratio: 3
Triglycerides: 128 mg/dL (ref 0.0–149.0)
VLDL: 25.6 mg/dL (ref 0.0–40.0)

## 2019-08-06 NOTE — Patient Instructions (Addendum)
For your neck pain :   x rays today  To look at the condition and alignment  of your vertebrae  You can take 1 aleve and 500 mg tylenol for next occurrence  Consider using a Pillow with neck support at night  The dizziness sounds like it's due to sudden position change causing crystals in ear to get "jostled"  Or due to  Blood pressure dropping a little when you stand up. BE CAREFUL; avoid jumping up and rushing off    Referral to hand surgeon to remove the cyst on your right pinkie finger   Health Maintenance After Age 57 After age 32, you are at a higher risk for certain long-term diseases and infections as well as injuries from falls. Falls are a major cause of broken bones and head injuries in people who are older than age 92. Getting regular preventive care can help to keep you healthy and well. Preventive care includes getting regular testing and making lifestyle changes as recommended by your health care provider. Talk with your health care provider about:  Which screenings and tests you should have. A screening is a test that checks for a disease when you have no symptoms.  A diet and exercise plan that is right for you. What should I know about screenings and tests to prevent falls? Screening and testing are the best ways to find a health problem early. Early diagnosis and treatment give you the best chance of managing medical conditions that are common after age 47. Certain conditions and lifestyle choices may make you more likely to have a fall. Your health care provider may recommend:  Regular vision checks. Poor vision and conditions such as cataracts can make you more likely to have a fall. If you wear glasses, make sure to get your prescription updated if your vision changes.  Medicine review. Work with your health care provider to regularly review all of the medicines you are taking, including over-the-counter medicines. Ask your health care provider about any side effects  that may make you more likely to have a fall. Tell your health care provider if any medicines that you take make you feel dizzy or sleepy.  Osteoporosis screening. Osteoporosis is a condition that causes the bones to get weaker. This can make the bones weak and cause them to break more easily.  Blood pressure screening. Blood pressure changes and medicines to control blood pressure can make you feel dizzy.  Strength and balance checks. Your health care provider may recommend certain tests to check your strength and balance while standing, walking, or changing positions.  Foot health exam. Foot pain and numbness, as well as not wearing proper footwear, can make you more likely to have a fall.  Depression screening. You may be more likely to have a fall if you have a fear of falling, feel emotionally low, or feel unable to do activities that you used to do.  Alcohol use screening. Using too much alcohol can affect your balance and may make you more likely to have a fall. What actions can I take to lower my risk of falls? General instructions  Talk with your health care provider about your risks for falling. Tell your health care provider if: ? You fall. Be sure to tell your health care provider about all falls, even ones that seem minor. ? You feel dizzy, sleepy, or off-balance.  Take over-the-counter and prescription medicines only as told by your health care provider. These include any supplements.  Eat a healthy diet and maintain a healthy weight. A healthy diet includes low-fat dairy products, low-fat (lean) meats, and fiber from whole grains, beans, and lots of fruits and vegetables. Home safety  Remove any tripping hazards, such as rugs, cords, and clutter.  Install safety equipment such as grab bars in bathrooms and safety rails on stairs.  Keep rooms and walkways well-lit. Activity   Follow a regular exercise program to stay fit. This will help you maintain your balance. Ask  your health care provider what types of exercise are appropriate for you.  If you need a cane or walker, use it as recommended by your health care provider.  Wear supportive shoes that have nonskid soles. Lifestyle  Do not drink alcohol if your health care provider tells you not to drink.  If you drink alcohol, limit how much you have: ? 0-1 drink a day for women. ? 0-2 drinks a day for men.  Be aware of how much alcohol is in your drink. In the [place] one drink equals one typical bottle of beer (12 oz), one-half glass of wine (5 oz), or one shot of hard liquor (1 oz).  Do not use any products that contain nicotine or tobacco, such as cigarettes and e-cigarettes. If you need help quitting, ask your health care provider. Summary  Having a healthy lifestyle and getting preventive care can help to protect your health and wellness after age 41.  Screening and testing are the best way to find a health problem early and help you avoid having a fall. Early diagnosis and treatment give you the best chance for managing medical conditions that are more common for people who are older than age 11.  Falls are a major cause of broken bones and head injuries in people who are older than age 21. Take precautions to prevent a fall at home.  Work with your health care provider to learn what changes you can make to improve your health and wellness and to prevent falls. This information is not intended to replace advice given to you by your health care provider. Make sure you discuss any questions you have with your health care provider. Document Revised: 05/07/2018 Document Reviewed: 11/27/2016 Elsevier Patient Education  2020 [employer].

## 2019-08-06 NOTE — Progress Notes (Signed)
Patient ID: Tina Silva, female    DOB: 11/03/1934  Age: 85 y.o. MRN: [id number]  The patient is here for  examination and management of other chronic and acute problems.   The risk factors are reflected in the social history.  The roster of all physicians providing medical care to patient - is listed in the Snapshot section of the chart.  Activities of daily living:  The patient is 100% independent in all ADLs: dressing, toileting, feeding as well as independent mobility  Home safety : The patient has smoke detectors in the home. They wear seatbelts.  There are no firearms at home. There is no violence in the home.   There is no risks for hepatitis, STDs or HIV. There is no   history of blood transfusion. They have no travel history to infectious disease endemic areas of the world.  The patient has seen their dentist in the last six month. They have seen their eye doctor in the last year. They admit to slight hearing difficulty with regard to whispered voices and some television programs.  They have deferred audiologic testing in the last year.  They do not  have excessive sun exposure. Discussed the need for sun protection: hats, long sleeves and use of sunscreen if there is significant sun exposure.   Diet: the importance of a healthy diet is discussed. They do have a healthy diet.  The benefits of regular aerobic exercise were discussed. She walks 4 times per week ,  20 minutes.   Depression screen: there are no signs or vegative symptoms of depression- irritability, change in appetite, anhedonia, sadness/tearfullness.  Cognitive assessment: the patient manages all their financial and personal affairs and is actively engaged. They could relate day,date,year and events; recalled 2/3 objects at 3 minutes; performed clock-face test normally.  The following portions of the patient's history were reviewed and updated as appropriate: allergies, current medications, past family history, past  medical history,  past surgical history, past social history  and problem list.  Visual acuity was not assessed per patient preference since she has regular follow up with her ophthalmologist. Hearing and body mass index were assessed and reviewed.   During the course of the visit the patient was educated and counseled about appropriate screening and preventive services including : fall prevention , diabetes screening, nutrition counseling, colorectal cancer screening, and recommended immunizations.    CC: The primary encounter diagnosis was Nodule of finger of right hand. Diagnoses of Neck pain, Hyperlipidemia with target LDL less than 100, Essential hypertension, Vertigo, aural, bilateral, and Cervical radiculitis were also pertinent to this visit.  1)  She had 2 dizzy spells in June that were provoked by getting up and walking to another room in house.  She has been checking her BP which was 130/80 or higher after each occasion. Both episodes resolved with meclizine.  2) She has developed a cyst on 5th finger DIP joint.  Began swelling one week ago .  Starting to alter the nail bed. Saw dermatology , they recommended referral  3) history of chest pain/tightness (mild, hard to describe) occurred recently  with dental procedure (received novocaine ?)   4)  Neck pain with spasm and pain in the occipital area.  Aggravated by extension of neck.  Radicular pain to left side of scalp    History Tina Silva has a past medical history of Hyperlipidemia, Hypertension, Hypothyroidism, Osteoporosis, and Vertigo (07/28/2016).   She has a past surgical history that includes Abdominal hysterectomy;  Appendectomy (1974); Tonsillectomy and adenoidectomy; Eye surgery (june 2013); Tonsillectomy; Colonoscopy with propofol (N/A, 02/12/2016); and Breast biopsy (Left, 11/04/2005).   Her family history includes Cancer in her maternal grandmother and sister; Diabetes in her paternal aunt and sister; Heart disease in her father;  Parkinson's disease in her mother.She reports that she has never smoked. She has never used smokeless tobacco. She reports that she does not drink alcohol and does not use drugs.  Outpatient Medications Prior to Visit  Medication Sig Dispense Refill  . Calcium Carbonate-Vit D-Min (CALTRATE PLUS PO) Take 1,200 mg by mouth daily.    . Cholecalciferol (VITAMIN D3) 1000 units CAPS Take 1 capsule by mouth.    . fluticasone (FLONASE) 50 MCG/ACT nasal spray Place 2 sprays into both nostrils daily. Marland KitchenKEEP ON FILE FOR FUTURE REFILLS 16 g 11  . meclizine (ANTIVERT) 25 MG tablet Take 1 tablet (25 mg total) by mouth 3 (three) times daily as needed for dizziness. 30 tablet 2  . metoprolol succinate (TOPROL-XL) 25 MG 24 hr tablet TAKE ONE-HALF TABLET BY MOUTH TWICE DAILY 90 tablet 1  . Multiple Vitamin (MULTIVITAMIN) tablet Take 1 tablet by mouth daily.    . pravastatin (PRAVACHOL) 20 MG tablet TAKE 1 TABLET BY MOUTH DAILY 90 tablet 3   No facility-administered medications prior to visit.    Review of Systems  Patient denies headache, fevers, malaise, unintentional weight loss, skin rash, eye pain, sinus congestion and sinus pain, sore throat, dysphagia,  hemoptysis , cough, dyspnea, wheezing, chest pain, palpitations, orthopnea, edema, abdominal pain, nausea, melena, diarrhea, constipation, flank pain, dysuria, hematuria, urinary  Frequency, nocturia, numbness, tingling, seizures,  Focal weakness, Loss of consciousness,  Tremor, insomnia, depression, anxiety, and suicidal ideation.     Objective:  BP 130/74 (BP Location: Left Arm, Patient Position: Sitting, Cuff Size: Normal)   Pulse 69   Temp 97.6 F (36.4 C) (Oral)   Resp 15   Ht 5\' 3"  (1.6 m)   Wt 137 lb 9.6 oz (62.4 kg)   SpO2 97%   BMI 24.37 kg/m   Physical Exam  General appearance: alert, cooperative and appears stated age Head: Normocephalic, without obvious abnormality, atraumatic Eyes: conjunctivae/corneas clear. PERRL, EOM's intact.  Fundi benign. Ears: normal TM's and external ear canals both ears Nose: Nares normal. Septum midline. Mucosa normal. No drainage or sinus tenderness. Throat: lips, mucosa, and tongue normal; teeth and gums normal Neck: no adenopathy, no carotid bruit, no JVD, supple, symmetrical, trachea midline and thyroid not enlarged, symmetric, no tenderness/mass/nodules Lungs: clear to auscultation bilaterally Breasts: normal appearance, no masses or tenderness Heart: regular rate and rhythm, S1, S2 normal, no murmur, click, rub or gallop Abdomen: soft, non-tender; bowel sounds normal; no masses,  no organomegaly Extremities: extremities normal, atraumatic, no cyanosis or edema Pulses: 2+ and symmetric Skin: Skin color, texture, turgor normal. No rashes or lesions Neurologic: Alert and oriented X 3, normal strength and tone. Normal symmetric reflexes. Normal coordination and gait.    Assessment & Plan:   Problem List Items Addressed This Visit      Unprioritized   Hypertension   Relevant Orders   Comprehensive metabolic panel (Completed)   Hyperlipidemia with target LDL less than 100    Lipids are at goal  On pravastatin   No  change in statin therapy  is advised given her age .  Aspirin therapy discontinued given results of ASPREE trial   Lab Results  Component Value Date   CHOL 120 08/06/2019   HDL  45.90 08/06/2019   LDLCALC 49 08/06/2019   TRIG 128.0 08/06/2019   CHOLHDL 3 08/06/2019   Lab Results  Component Value Date   ALT 14 08/06/2019   AST 17 08/06/2019   ALKPHOS 61 08/06/2019   BILITOT 0.7 08/06/2019         Relevant Orders   Lipid panel (Completed)   Nodule of finger of right hand - Primary    She has an enlarging cyst on the DIP of her fifth finger.  Hand surgery referral made.       Relevant Orders   Ambulatory referral to Hand Surgery   Vertigo, aural, bilateral    Recent episodes were brought on by position change and relieved with meclizine..  Orthostatic  readings today were done and her systolic dropped 5 pts. Cautioned about need to equilibrate before taking off      Cervical radiculitis    Advised to take NSAID and tylenol ,        Other Visit Diagnoses    Neck pain       Relevant Orders   DG Cervical Spine Complete (Completed)      I am having Tina Silva. Junkins maintain her multivitamin, Vitamin D3, Calcium Carbonate-Vit D-Min (CALTRATE PLUS PO), meclizine, fluticasone, pravastatin, and metoprolol succinate.  No orders of the defined types were placed in this encounter.   There are no discontinued medications.  Follow-up: No follow-ups on file.   Crecencio Mc, MD

## 2019-08-08 DIAGNOSIS — M5412 Radiculopathy, cervical region: Secondary | ICD-10-CM | POA: Insufficient documentation

## 2019-08-08 NOTE — Assessment & Plan Note (Signed)
Advised to take NSAID and tylenol ,

## 2019-08-08 NOTE — Assessment & Plan Note (Signed)
Lipids are at goal  On pravastatin   No  change in statin therapy  is advised given her age .  Aspirin therapy discontinued given results of ASPREE trial   Lab Results  Component Value Date   CHOL 120 08/06/2019   HDL 45.90 08/06/2019   LDLCALC 49 08/06/2019   TRIG 128.0 08/06/2019   CHOLHDL 3 08/06/2019   Lab Results  Component Value Date   ALT 14 08/06/2019   AST 17 08/06/2019   ALKPHOS 61 08/06/2019   BILITOT 0.7 08/06/2019

## 2019-08-08 NOTE — Assessment & Plan Note (Signed)
She has an enlarging cyst on the DIP of her fifth finger.  Hand surgery referral made.

## 2019-08-08 NOTE — Assessment & Plan Note (Signed)
Recent episodes were brought on by position change and relieved with meclizine..  Orthostatic readings today were done and her systolic dropped 5 pts. Cautioned about need to equilibrate before taking off

## 2019-08-16 ENCOUNTER — Telehealth: Payer: Self-pay | Admitting: Internal Medicine

## 2019-08-16 NOTE — Telephone Encounter (Signed)
Medication pt had a reaction to was lidocaine w/ epi. Pt stated that it caused her to have chest tightness. Allergy list has been updated.

## 2019-08-16 NOTE — Telephone Encounter (Signed)
Pt came and dropped off medication that she had an allergic reaction to at her dentist's office. Placed in folder up front

## 2019-08-17 DIAGNOSIS — M67442 Ganglion, left hand: Secondary | ICD-10-CM | POA: Diagnosis not present

## 2019-08-17 DIAGNOSIS — R2231 Localized swelling, mass and lump, right upper limb: Secondary | ICD-10-CM | POA: Diagnosis not present

## 2019-09-08 DIAGNOSIS — E785 Hyperlipidemia, unspecified: Secondary | ICD-10-CM | POA: Diagnosis not present

## 2019-09-08 DIAGNOSIS — M25741 Osteophyte, right hand: Secondary | ICD-10-CM | POA: Diagnosis not present

## 2019-09-08 DIAGNOSIS — M67442 Ganglion, left hand: Secondary | ICD-10-CM | POA: Diagnosis not present

## 2019-09-08 DIAGNOSIS — Z888 Allergy status to other drugs, medicaments and biological substances status: Secondary | ICD-10-CM | POA: Diagnosis not present

## 2019-09-08 DIAGNOSIS — I10 Essential (primary) hypertension: Secondary | ICD-10-CM | POA: Diagnosis not present

## 2019-09-08 DIAGNOSIS — M810 Age-related osteoporosis without current pathological fracture: Secondary | ICD-10-CM | POA: Diagnosis not present

## 2019-09-08 DIAGNOSIS — M67441 Ganglion, right hand: Secondary | ICD-10-CM | POA: Diagnosis not present

## 2019-09-08 DIAGNOSIS — Z881 Allergy status to other antibiotic agents status: Secondary | ICD-10-CM | POA: Diagnosis not present

## 2019-10-14 ENCOUNTER — Other Ambulatory Visit: Payer: Self-pay

## 2019-10-15 ENCOUNTER — Other Ambulatory Visit: Payer: Self-pay

## 2019-10-15 ENCOUNTER — Ambulatory Visit (INDEPENDENT_AMBULATORY_CARE_PROVIDER_SITE_OTHER): Payer: Medicare Other

## 2019-10-15 DIAGNOSIS — Z23 Encounter for immunization: Secondary | ICD-10-CM | POA: Diagnosis not present

## 2019-11-23 DIAGNOSIS — Z961 Presence of intraocular lens: Secondary | ICD-10-CM | POA: Diagnosis not present

## 2019-12-02 ENCOUNTER — Other Ambulatory Visit: Payer: Self-pay | Admitting: Internal Medicine

## 2020-01-24 ENCOUNTER — Other Ambulatory Visit: Payer: Self-pay | Admitting: Internal Medicine

## 2020-02-03 IMAGING — MG MM DIGITAL SCREENING BILAT W/ TOMO W/ CAD
8 series · 9 of 24 positions shown · non-contrast
Comparison: Previous exam(s).

CLINICAL DATA: Screening.

EXAM:
DIGITAL SCREENING BILATERAL MAMMOGRAM WITH TOMO AND CAD

[L MLO synth-2D]
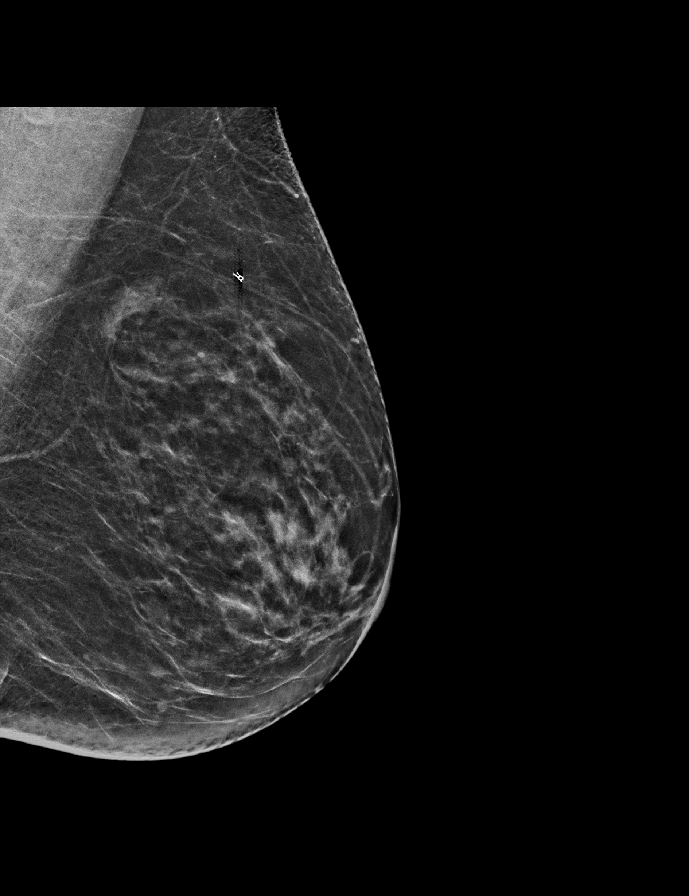

[R MLO synth-2D]
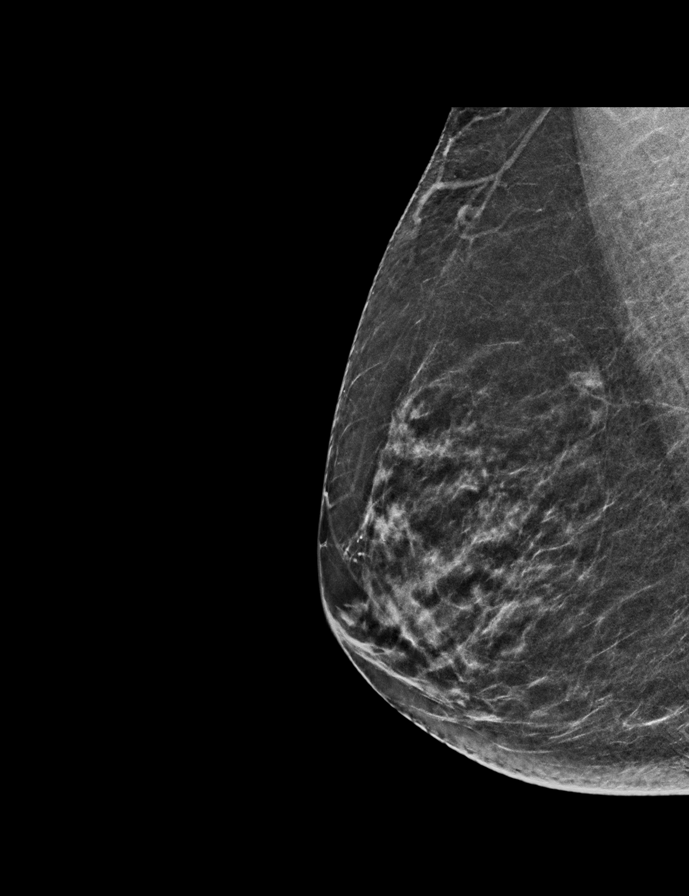

[R CC synth-2D]
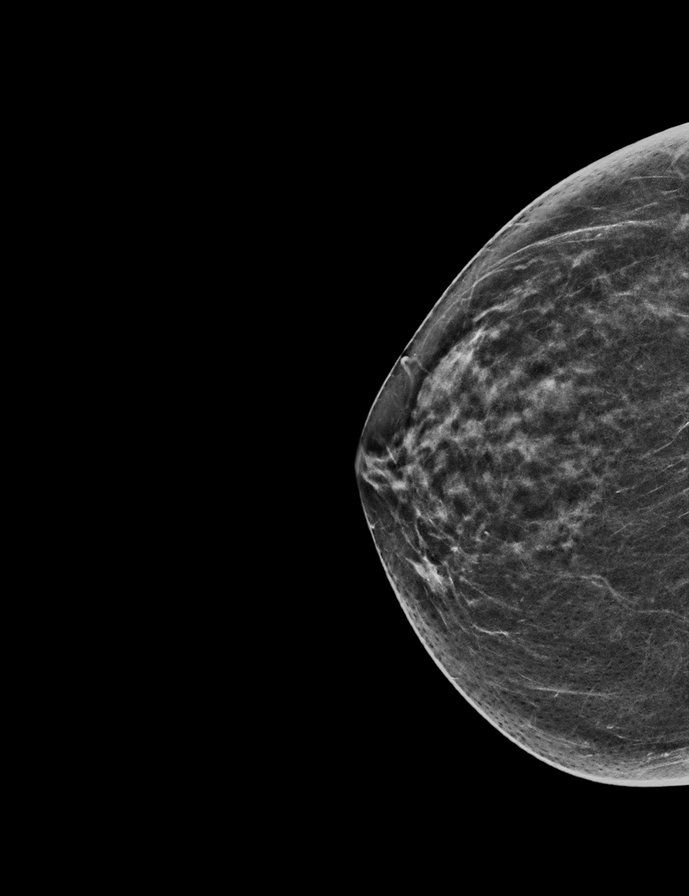

[L CC synth-2D]
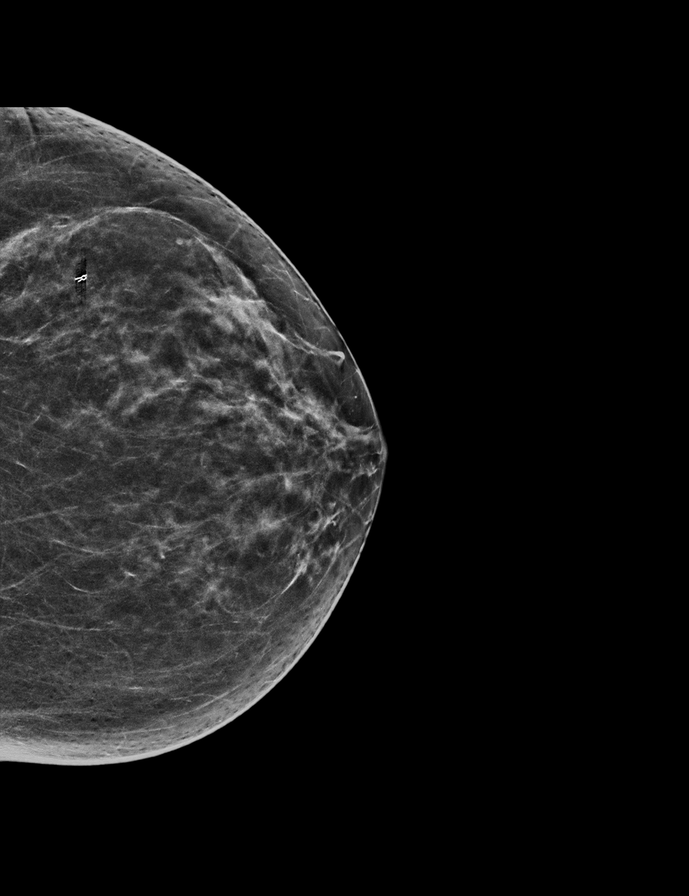

[R MLO tomo · 2 of 53 frames shown]
[frame 18/53]
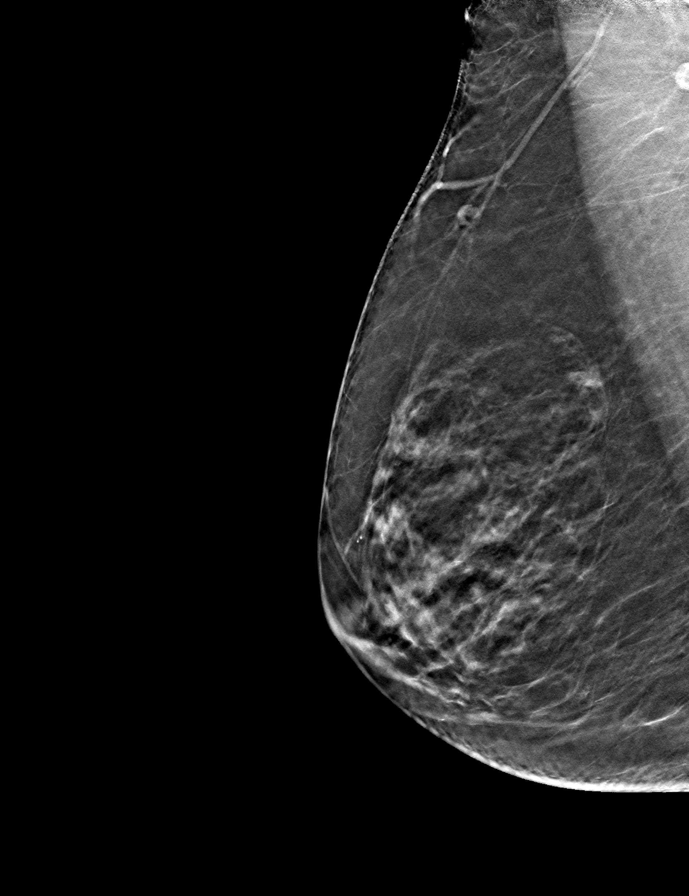
[frame 27/53]
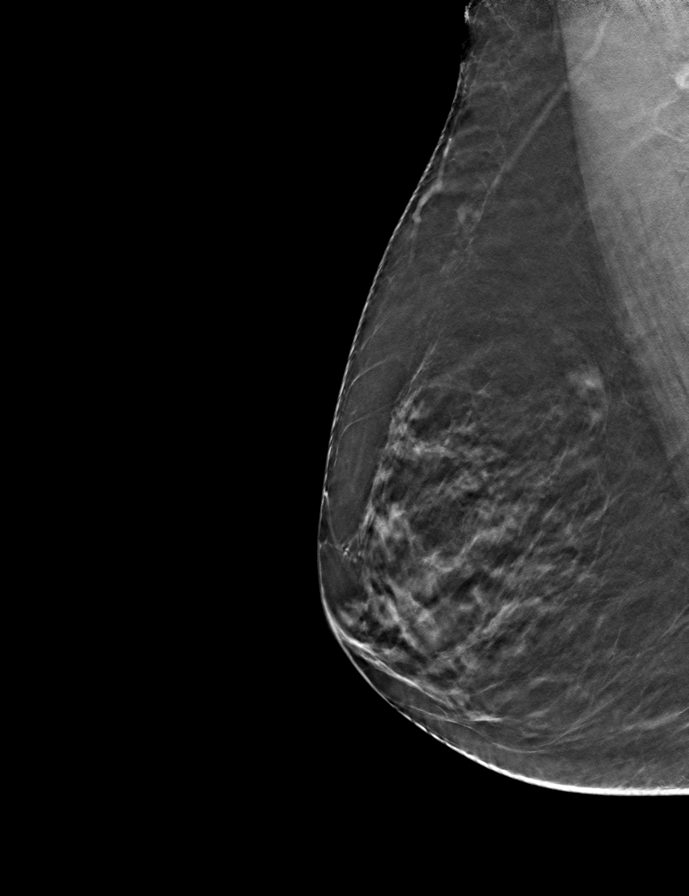

[R CC tomo · tomo slice 27/53.0]
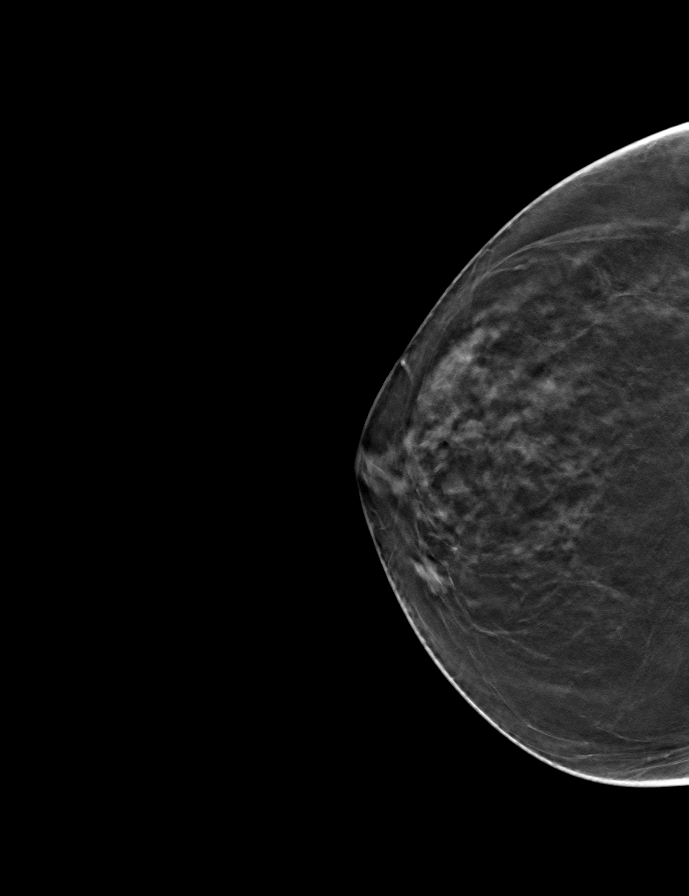

[L MLO tomo · tomo slice 31/60.0]
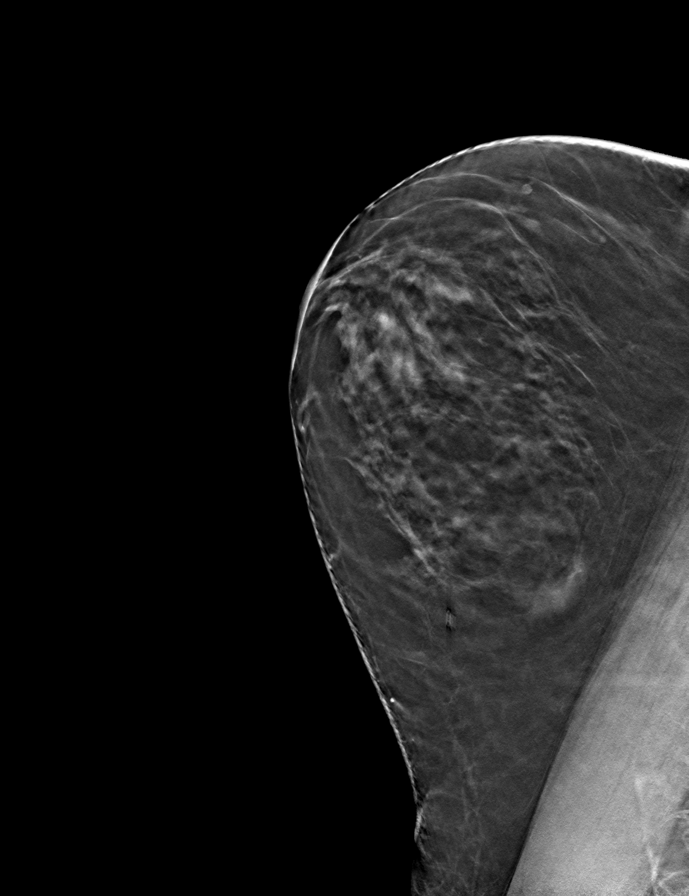

[L CC tomo · tomo slice 28/55.0]
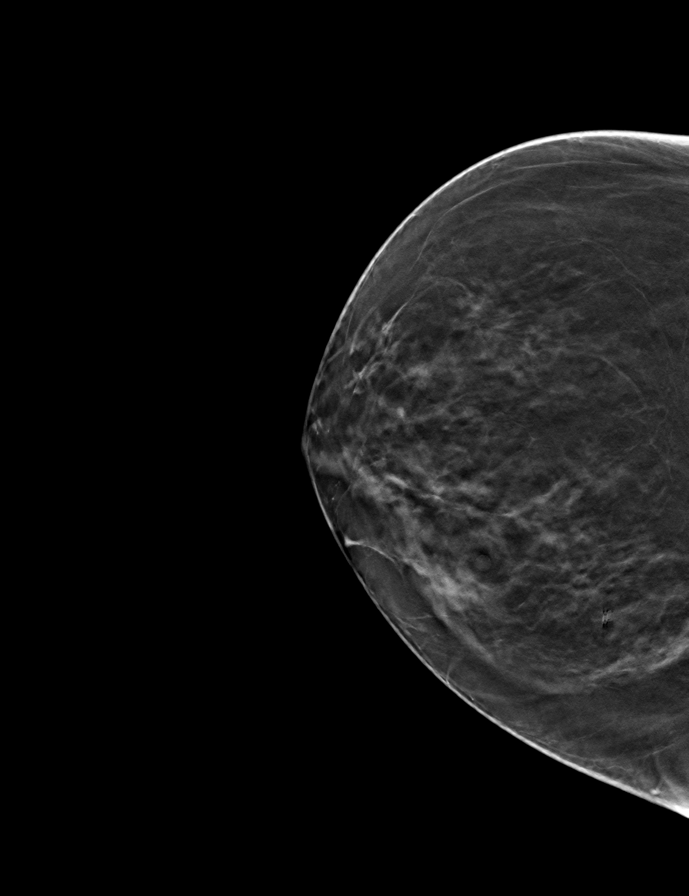

[9 of 24 positions shown; findings below may reference images not displayed]

ACR Breast Density Category b: There are scattered areas of
fibroglandular density.
FINDINGS: There are no findings suspicious for malignancy. Images were
processed with CAD.
IMPRESSION: No mammographic evidence of malignancy. A result letter of this
screening mammogram will be mailed directly to the patient.

RECOMMENDATION:
Screening mammogram in one year. (Code:[id number])

BI-RADS CATEGORY  1: Negative.

## 2020-02-25 ENCOUNTER — Telehealth: Payer: Self-pay | Admitting: Internal Medicine

## 2020-02-25 MED ORDER — PRAVASTATIN SODIUM 20 MG PO TABS: 20.0000 mg | ORAL_TABLET | Freq: Every day | ORAL | 3 refills | Status: DC

## 2020-02-25 NOTE — Telephone Encounter (Signed)
Patient called in for refill for pravastatin (PRAVACHOL) 20 MG tablet

## 2020-04-07 ENCOUNTER — Ambulatory Visit (INDEPENDENT_AMBULATORY_CARE_PROVIDER_SITE_OTHER): Payer: Medicare Other

## 2020-04-07 VITALS — Ht 63.0 in | Wt 139.0 lb

## 2020-04-07 DIAGNOSIS — Z0000 Encounter for general adult medical examination without abnormal findings: Secondary | ICD-10-CM | POA: Diagnosis not present

## 2020-04-07 NOTE — Patient Instructions (Addendum)
Tina Silva , Thank you for taking time to come for your Medicare Wellness Visit. I appreciate your ongoing commitment to your health goals. Please review the following plan we discussed and let me know if I can assist you in the future.   These are the goals we discussed: Goals      Patient Stated   .  I would like to eat a healthier diet (pt-stated)      Stay active  Stay hydrated  Maintain weight 138lb       This is a list of the screening recommended for you and due dates:  Health Maintenance  Topic Date Due  . Mammogram  07/20/2020  . Tetanus Vaccine  12/29/2021  . Flu Shot  Completed  . DEXA scan (bone density measurement)  Completed  . COVID-19 Vaccine  Completed  . Pneumonia vaccines  Completed  . HPV Vaccine  Aged Out   Keep all routine maintenance appointments.   Follow up 08/07/20 @ 8:30  Advanced directives: on file  Conditions/risks identified: none new  Follow up in one year for your annual wellness visit    Preventive Care 65 Years and Older, Female Preventive care refers to lifestyle choices and visits with your health care provider that can promote health and wellness. What does preventive care include?  A yearly physical exam. This is also called an annual well check.  Dental exams once or twice a year.  Routine eye exams. Ask your health care provider how often you should have your eyes checked.  Personal lifestyle choices, including:  Daily care of your teeth and gums.  Regular physical activity.  Eating a healthy diet.  Avoiding tobacco and drug use.  Limiting alcohol use.  Practicing safe sex.  Taking low-dose aspirin every day.  Taking vitamin and mineral supplements as recommended by your health care provider. What happens during an annual well check? The services and screenings done by your health care provider during your annual well check will depend on your age, overall health, lifestyle risk factors, and family history of  disease. Counseling  Your health care provider may ask you questions about your:  Alcohol use.  Tobacco use.  Drug use.  Emotional well-being.  Home and relationship well-being.  Sexual activity.  Eating habits.  History of falls.  Memory and ability to understand (cognition).  Work and work Statistician.  Reproductive health. Screening  You may have the following tests or measurements:  Height, weight, and BMI.  Blood pressure.  Lipid and cholesterol levels. These may be checked every 5 years, or more frequently if you are over 37 years old.  Skin check.  Lung cancer screening. You may have this screening every year starting at age 51 if you have a 30-pack-year history of smoking and currently smoke or have quit within the past 15 years.  Fecal occult blood test (FOBT) of the stool. You may have this test every year starting at age 64.  Flexible sigmoidoscopy or colonoscopy. You may have a sigmoidoscopy every 5 years or a colonoscopy every 10 years starting at age 55.  Hepatitis C blood test.  Hepatitis B blood test.  Sexually transmitted disease (STD) testing.  Diabetes screening. This is done by checking your blood sugar (glucose) after you have not eaten for a while (fasting). You may have this done every 1-3 years.  Bone density scan. This is done to screen for osteoporosis. You may have this done starting at age 37.  Mammogram. This may  be done every 1-2 years. Talk to your health care provider about how often you should have regular mammograms. Talk with your health care provider about your test results, treatment options, and if necessary, the need for more tests. Vaccines  Your health care provider may recommend certain vaccines, such as:  Influenza vaccine. This is recommended every year.  Tetanus, diphtheria, and acellular pertussis (Tdap, Td) vaccine. You may need a Td booster every 10 years.  Zoster vaccine. You may need this after age  97.  Pneumococcal 13-valent conjugate (PCV13) vaccine. One dose is recommended after age 5.  Pneumococcal polysaccharide (PPSV23) vaccine. One dose is recommended after age 57. Talk to your health care provider about which screenings and vaccines you need and how often you need them. This information is not intended to replace advice given to you by your health care provider. Make sure you discuss any questions you have with your health care provider. Document Released: 02/10/2015 Document Revised: 10/04/2015 Document Reviewed: 11/15/2014 Elsevier Interactive Patient Education  2017 [place] Prevention in the Home Falls can cause injuries. They can happen to people of all ages. There are many things you can do to make your home safe and to help prevent falls. What can I do on the outside of my home?  Regularly fix the edges of walkways and driveways and fix any cracks.  Remove anything that might make you trip as you walk through a door, such as a raised step or threshold.  Trim any bushes or trees on the path to your home.  Use bright outdoor lighting.  Clear any walking paths of anything that might make someone trip, such as rocks or tools.  Regularly check to see if handrails are loose or broken. Make sure that both sides of any steps have handrails.  Any raised decks and porches should have guardrails on the edges.  Have any leaves, snow, or ice cleared regularly.  Use sand or salt on walking paths during winter.  Clean up any spills in your garage right away. This includes oil or grease spills. What can I do in the bathroom?  Use night lights.  Install grab bars by the toilet and in the tub and shower. Do not use towel bars as grab bars.  Use non-skid mats or decals in the tub or shower.  If you need to sit down in the shower, use a plastic, non-slip stool.  Keep the floor dry. Clean up any water that spills on the floor as soon as it happens.  Remove  soap buildup in the tub or shower regularly.  Attach bath mats securely with double-sided non-slip rug tape.  Do not have throw rugs and other things on the floor that can make you trip. What can I do in the bedroom?  Use night lights.  Make sure that you have a light by your bed that is easy to reach.  Do not use any sheets or blankets that are too big for your bed. They should not hang down onto the floor.  Have a firm chair that has side arms. You can use this for support while you get dressed.  Do not have throw rugs and other things on the floor that can make you trip. What can I do in the kitchen?  Clean up any spills right away.  Avoid walking on wet floors.  Keep items that you use a lot in easy-to-reach places.  If you need to reach something above you,  use a strong step stool that has a grab bar.  Keep electrical cords out of the way.  Do not use floor polish or wax that makes floors slippery. If you must use wax, use non-skid floor wax.  Do not have throw rugs and other things on the floor that can make you trip. What can I do with my stairs?  Do not leave any items on the stairs.  Make sure that there are handrails on both sides of the stairs and use them. Fix handrails that are broken or loose. Make sure that handrails are as long as the stairways.  Check any carpeting to make sure that it is firmly attached to the stairs. Fix any carpet that is loose or worn.  Avoid having throw rugs at the top or bottom of the stairs. If you do have throw rugs, attach them to the floor with carpet tape.  Make sure that you have a light switch at the top of the stairs and the bottom of the stairs. If you do not have them, ask someone to add them for you. What else can I do to help prevent falls?  Wear shoes that:  Do not have high heels.  Have rubber bottoms.  Are comfortable and fit you well.  Are closed at the toe. Do not wear sandals.  If you use a  stepladder:  Make sure that it is fully opened. Do not climb a closed stepladder.  Make sure that both sides of the stepladder are locked into place.  Ask someone to hold it for you, if possible.  Clearly mark and make sure that you can see:  Any grab bars or handrails.  First and last steps.  Where the edge of each step is.  Use tools that help you move around (mobility aids) if they are needed. These include:  Canes.  Walkers.  Scooters.  Crutches.  Turn on the lights when you go into a dark area. Replace any light bulbs as soon as they burn out.  Set up your furniture so you have a clear path. Avoid moving your furniture around.  If any of your floors are uneven, fix them.  If there are any pets around you, be aware of where they are.  Review your medicines with your doctor. Some medicines can make you feel dizzy. This can increase your chance of falling. Ask your doctor what other things that you can do to help prevent falls. This information is not intended to replace advice given to you by your health care provider. Make sure you discuss any questions you have with your health care provider. Document Released: 11/10/2008 Document Revised: 06/22/2015 Document Reviewed: 02/18/2014 Elsevier Interactive Patient Education  2017 [employer].

## 2020-04-07 NOTE — Progress Notes (Addendum)
Subjective:   Tina Silva is a 86 y.o. female who presents for Medicare Annual (Subsequent) preventive examination.  Review of Systems    No ROS.  Medicare Wellness Virtual Visit.   Cardiac Risk Factors include: advanced age (>33men, >66 women)     Objective:    Today's Vitals   04/07/20 0903  Weight: 139 lb (63 kg)  Height: 5\' 3"  (1.6 m)   Body mass index is 24.62 kg/m.  Advanced Directives 04/07/2020 04/07/2019 04/03/2018 04/01/2017 07/28/2016 07/28/2016 07/03/2016  Does Patient Have a Medical Advance Directive? Yes Yes Yes Yes Yes No Yes  Type of Paramedic of [place];Living will [place];Living will [place];Living will [place];Living will [place];Living will - [place];Living will  Does patient want to make changes to medical advance directive? No - Patient declined No - Patient declined - No - Patient declined No - Patient declined - No - Patient declined  Copy of [place] in Chart? Yes - validated most recent copy scanned in chart (See row information) Yes - validated most recent copy scanned in chart (See row information) Yes - validated most recent copy scanned in chart (See row information) No - copy requested No - copy requested - -  Would patient like information on creating a medical advance directive? - - - - No - Patient declined No - Patient declined -    Current Medications (verified) Outpatient Encounter Medications as of 04/07/2020  Medication Sig   Calcium Carbonate-Vit D-Min (CALTRATE PLUS PO) Take 1,200 mg by mouth daily.   Cholecalciferol (VITAMIN D3) 1000 units CAPS Take 1 capsule by mouth.   fluticasone (FLONASE) 50 MCG/ACT nasal spray Place 2 sprays into both nostrils daily. Marland KitchenKEEP ON FILE FOR FUTURE REFILLS   meclizine (ANTIVERT) 25 MG tablet Take 1 tablet (25 mg total) by mouth 3 (three) times daily as needed for  dizziness.   metoprolol succinate (TOPROL-XL) 25 MG 24 hr tablet TAKE ONE-HALF TABLET BY MOUTH TWICE DAILY   Multiple Vitamin (MULTIVITAMIN) tablet Take 1 tablet by mouth daily.   pravastatin (PRAVACHOL) 20 MG tablet Take 1 tablet (20 mg total) by mouth daily.   No facility-administered encounter medications on file as of 04/07/2020.    Allergies (verified) Lidocaine-epinephrine, Ciprofloxacin, Doxycycline, Flagyl [metronidazole], Iodine, and Sudafed [pseudoephedrine hcl]   History: Past Medical History:  Diagnosis Date   Hyperlipidemia    Hypertension    Hypothyroidism    Osteoporosis    Vertigo 07/28/2016   Past Surgical History:  Procedure Laterality Date   ABDOMINAL HYSTERECTOMY     APPENDECTOMY  1974   BREAST BIOPSY Left 11/04/2005   core   COLONOSCOPY WITH PROPOFOL N/A 02/12/2016   Procedure: COLONOSCOPY WITH PROPOFOL;  Surgeon: Manya Silvas, MD;  Location: [facility];  Service: Endoscopy;  Laterality: N/A;   EYE SURGERY  june 2013   cataract with lens,  dinglein left eye    TONSILLECTOMY     TONSILLECTOMY AND ADENOIDECTOMY     Family History  Problem Relation Age of Onset   Parkinson's disease Mother    Cancer Sister        AML   Diabetes Sister    Heart disease Father    Diabetes Paternal Aunt    Cancer Maternal Grandmother    Breast cancer Neg Hx    Social History   Socioeconomic History   Marital status: Single  Spouse name: Not on file   Number of children: Not on file   Years of education: Not on file   Highest education level: Not on file  Occupational History   Not on file  Tobacco Use   Smoking status: Never Smoker   Smokeless tobacco: Never Used  Substance and Sexual Activity   Alcohol use: No   Drug use: No   Sexual activity: Never  Other Topics Concern   Not on file  Social History Narrative   Independent at baseline   Social Determinants of Health   Financial Resource Strain: Low Risk    Difficulty of Paying Living Expenses:  Not hard at all  Food Insecurity: No Food Insecurity   Worried About Charity fundraiser in the Last Year: Never true   [place] in the Last Year: Never true  Transportation Needs: No Transportation Needs   Lack of Transportation (Medical): No   Lack of Transportation (Non-Medical): No  Physical Activity: Insufficiently Active   Days of Exercise per Week: 2 days   Minutes of Exercise per Session: 60 min  Stress: No Stress Concern Present   Feeling of Stress : Not at all  Social Connections: Unknown   Frequency of Communication with Friends and Family: More than three times a week   Frequency of Social Gatherings with Friends and Family: Not on file   Attends Religious Services: Not on Electrical engineer or Organizations: Not on file   Attends Archivist Meetings: Not on file   Marital Status: Not on file    Tobacco Counseling Counseling given: Not Answered   Clinical Intake:  Pre-visit preparation completed: Yes        Diabetes: No  How often do you need to have someone help you when you read instructions, pamphlets, or other written materials from your doctor or pharmacy?: 1 - Never    Interpreter Needed?: No      Activities of Daily Living In your present state of health, do you have any difficulty performing the following activities: 04/07/2020  Hearing? N  Vision? N  Difficulty concentrating or making decisions? N  Walking or climbing stairs? N  Dressing or bathing? N  Doing errands, shopping? N  Preparing Food and eating ? N  Using the Toilet? N  In the past six months, have you accidently leaked urine? N  Do you have problems with loss of bowel control? N  Managing your Medications? N  Managing your Finances? N  Housekeeping or managing your Housekeeping? N  Some recent data might be hidden    Patient Care Team: Crecencio Mc, MD as PCP - General (Internal Medicine)  Indicate any recent Medical Services you may have  received from other than [facility] providers in the past year (date may be approximate).     Assessment:   This is a routine wellness examination for Tina Silva.  I connected with Tina Silva today by telephone and verified that I am speaking with the correct person using two identifiers. Location patient: home Location provider: work Persons participating in the virtual visit: patient, Marine scientist.    I discussed the limitations, risks, security and privacy concerns of performing an evaluation and management service by telephone and the availability of in person appointments. The patient expressed understanding and verbally consented to this telephonic visit.    Interactive audio and video telecommunications were attempted between this provider and patient, however failed, due to patient having technical difficulties  OR patient did not have access to video capability.  We continued and completed visit with audio only.  Some vital signs may be absent or patient reported.   Hearing/Vision screen  Hearing Screening   125Hz  250Hz  500Hz  1000Hz  2000Hz  3000Hz  4000Hz  6000Hz  8000Hz   Right ear:           Left ear:           Comments: Followed by [facility]  Difficulty hearing conversational tones  Does not wear hearing aids  Vision Screening Comments: [facility] Wears corrective lenses Last OV 2018 Cataract extraction, L eye only Visual acuity not assessed per patient preference since they have regular follow up with the ophthalmologist  Dietary issues and exercise activities discussed: Current Exercise Habits: Home exercise routine, Type of exercise: treadmill, Intensity: Mild  Healthy diet Good water intake  Goals       Patient Stated     I would like to eat a healthier diet (pt-stated)      Stay active  Stay hydrated  Maintain weight 138lb       Depression Screen PHQ 2/9 Scores 04/07/2020 04/07/2019 02/17/2019 04/03/2018 04/01/2017 10/29/2016 07/03/2016  PHQ - 2 Score 0 0 0 0 0 0 0   PHQ- 9 Score - 0 0 - - - -    Fall Risk Fall Risk  04/07/2020 08/06/2019 04/07/2019 03/15/2019 02/17/2019  Falls in the past year? 0 0 0 0 0  Number falls in past yr: 0 - - - 0  Injury with Fall? 0 - - - 0  Follow up Falls evaluation completed Falls evaluation completed Falls evaluation completed Falls evaluation completed Falls evaluation completed    [place]: Handrails in use when using stairs? Yes Home free of loose throw rugs in walkways, pet beds, electrical cords, etc? Yes  Adequate lighting in your home to reduce risk of falls? Yes   ASSISTIVE DEVICES UTILIZED TO PREVENT FALLS: Life alert on watch? No  Use of a cane, walker or w/c? No   TIMED UP AND GO: Was the test performed? No . Virtual visit.   Cognitive Function: Patient is alert and oriented x3.  Denies difficulty focusing, making decisions, memory loss.  Enjoys playing jigsaw puzzles, games on her phone and manages her own finances for brain health activity.  MMSE/6CIT deferred. Normal by direct communication/observation. MMSE - Mini Mental State Exam 04/01/2017 03/26/2016 03/14/2015  Orientation to time 5 5 5   Orientation to Place 5 5 5   Registration 3 3 3   Attention/ Calculation 5 5 5   Recall 3 3 3   Language- name 2 objects 2 2 2   Language- repeat 1 1 1   Language- follow 3 step command 3 3 3   Language- read & follow direction 1 1 1   Write a sentence 1 1 1   Copy design 1 1 1   Total score 30 30 30      6CIT Screen 04/07/2019 04/03/2018  What Year? 0 points 0 points  What month? 0 points 0 points  What time? 0 points 0 points  Count back from 20 - 0 points  Months in reverse - 0 points  Repeat phrase - 0 points  Total Score - 0    Immunizations Immunization History  Administered Date(s) Administered   Fluad Quad(high Dose 65+) 10/12/2018, 10/15/2019   Influenza Whole 10/22/2011   Influenza, High Dose Seasonal PF 10/18/2016, 10/21/2017   Influenza-Unspecified 11/05/2012,  10/11/2013, 10/06/2014, 10/15/2015   PFIZER(Purple Top)SARS-COV-2  Vaccination 02/02/2019, 02/23/2019, 11/01/2019   Pneumococcal Conjugate-13 10/12/2013   Pneumococcal Polysaccharide-23 07/15/2012, 01/02/2018   Tdap 12/30/2011   Zoster 07/22/2011   Zoster Recombinat (Shingrix) 10/07/2017, 12/09/2017   Health Maintenance Health Maintenance  Topic Date Due   MAMMOGRAM  07/20/2020   TETANUS/TDAP  12/29/2021   INFLUENZA VACCINE  Completed   DEXA SCAN  Completed   COVID-19 Vaccine  Completed   PNA vac Low Risk Adult  Completed   HPV VACCINES  Aged Out   Colorectal cancer screening: No longer required.   Mammogram status: Completed 07/21/19. Repeat every year  Bone Density status: Completed 02/04/18. Results reflect: Bone density results: OSTEOPOROSIS. Repeat every 2 years. Cholecalciferol (VITAMIN D3) 1000 units CAPS  Lung Cancer Screening: (Low Dose CT Chest recommended if Age 13-80 years, 30 pack-year currently smoking OR have quit w/in 15years.) does not qualify.   Vision Screening: Recommended annual ophthalmology exams for early detection of glaucoma and other disorders of the eye. Is the patient up to date with their annual eye exam?  Yes  Who is the provider or what is the name of the office in which the patient attends annual eye exams? [facility], Dr. Jeni Salles.  Dental Screening: Recommended annual dental exams for proper oral hygiene.  Community Resource Referral / Chronic Care Management: CRR required this visit?  No   CCM required this visit?  No      Plan:   Keep all routine maintenance appointments.   Follow up 08/07/20 @ 8:30  I have personally reviewed and noted the following in the patient's chart:   Medical and social history Use of alcohol, tobacco or illicit drugs  Current medications and supplements Functional ability and status Nutritional status Physical activity Advanced directives List of other physicians Hospitalizations, surgeries,  and ER visits in previous 12 months Vitals Screenings to include cognitive, depression, and falls Referrals and appointments  In addition, I have reviewed and discussed with patient certain preventive protocols, quality metrics, and best practice recommendations. A written personalized care plan for preventive services as well as general preventive health recommendations were provided to patient via mychart.     OBrien-Blaney, Lorelai Huyser L, LPN   08/22/3662    I have reviewed the above information and agree with above.   Deborra Medina, MD

## 2020-05-03 DIAGNOSIS — Z23 Encounter for immunization: Secondary | ICD-10-CM | POA: Diagnosis not present

## 2020-05-09 DIAGNOSIS — H5711 Ocular pain, right eye: Secondary | ICD-10-CM | POA: Diagnosis not present

## 2020-05-22 ENCOUNTER — Other Ambulatory Visit: Payer: Self-pay | Admitting: Internal Medicine

## 2020-06-06 ENCOUNTER — Other Ambulatory Visit: Payer: Self-pay | Admitting: Internal Medicine

## 2020-06-06 DIAGNOSIS — Z1231 Encounter for screening mammogram for malignant neoplasm of breast: Secondary | ICD-10-CM

## 2020-07-21 ENCOUNTER — Other Ambulatory Visit: Payer: Self-pay

## 2020-07-21 ENCOUNTER — Ambulatory Visit
Admission: RE | Admit: 2020-07-21 | Discharge: 2020-07-21 | Disposition: A | Discharge status: Home / Self Care | Payer: Medicare Other | Source: Ambulatory Visit | Attending: Internal Medicine | Admitting: Internal Medicine

## 2020-07-21 DIAGNOSIS — Z1231 Encounter for screening mammogram for malignant neoplasm of breast: Secondary | ICD-10-CM | POA: Diagnosis not present

## 2020-08-06 DIAGNOSIS — Z20822 Contact with and (suspected) exposure to covid-19: Secondary | ICD-10-CM | POA: Diagnosis not present

## 2020-08-07 ENCOUNTER — Ambulatory Visit: Payer: No Typology Code available for payment source | Admitting: Internal Medicine

## 2020-08-08 ENCOUNTER — Other Ambulatory Visit: Payer: Self-pay

## 2020-08-08 ENCOUNTER — Encounter: Payer: Self-pay | Admitting: Internal Medicine

## 2020-08-08 ENCOUNTER — Ambulatory Visit (INDEPENDENT_AMBULATORY_CARE_PROVIDER_SITE_OTHER): Payer: Medicare Other | Admitting: Internal Medicine

## 2020-08-08 VITALS — BP 130/58 | HR 75 | Resp 15 | Ht 63.0 in | Wt 138.4 lb

## 2020-08-08 DIAGNOSIS — I10 Essential (primary) hypertension: Secondary | ICD-10-CM

## 2020-08-08 DIAGNOSIS — E785 Hyperlipidemia, unspecified: Secondary | ICD-10-CM

## 2020-08-08 DIAGNOSIS — I471 Supraventricular tachycardia: Secondary | ICD-10-CM | POA: Diagnosis not present

## 2020-08-08 DIAGNOSIS — N952 Postmenopausal atrophic vaginitis: Secondary | ICD-10-CM | POA: Diagnosis not present

## 2020-08-08 DIAGNOSIS — D2360 Other benign neoplasm of skin of unspecified upper limb, including shoulder: Secondary | ICD-10-CM | POA: Diagnosis not present

## 2020-08-08 DIAGNOSIS — E559 Vitamin D deficiency, unspecified: Secondary | ICD-10-CM | POA: Diagnosis not present

## 2020-08-08 NOTE — Patient Instructions (Signed)
The caffeine in the chocolate custard can raise your pulse.  As long as it does not go above 120,  you should be able to tolerate the side effect   I do recommend getting the Moderna vacccine in October  Use tylenol for the body aches you may get.  Call for nausea medication if needed

## 2020-08-08 NOTE — Assessment & Plan Note (Signed)
Managed with metoprolol without side effects .  No changes today

## 2020-08-08 NOTE — Assessment & Plan Note (Signed)
Lipids are at goal on pravastatin.  There is no change in statin therapy  is advised given her age .  Aspirin therapy discontinued given results of ASPREE trial   Lab Results  Component Value Date   CHOL 120 08/06/2019   HDL 45.90 08/06/2019   LDLCALC 49 08/06/2019   TRIG 128.0 08/06/2019   CHOLHDL 3 08/06/2019   Lab Results  Component Value Date   ALT 14 08/06/2019   AST 17 08/06/2019   ALKPHOS 61 08/06/2019   BILITOT 0.7 08/06/2019

## 2020-08-08 NOTE — Assessment & Plan Note (Signed)
Suggested by history of intermittent vaginal discharge without symptoms.  No workup or treatment  required

## 2020-08-08 NOTE — Progress Notes (Signed)
Patient ID: Tina Silva, female    DOB: [birth year]  Age: 86 y.o. MRN: [id number]  The patient is here for follow up and examination and management of other chronic and acute problems.   The risk factors are reflected in the social history.  The roster of all physicians providing medical care to patient - is listed in the Snapshot section of the chart.  Activities of daily living:  The patient is 100% independent in all ADLs: dressing, toileting, feeding as well as independent mobility  Home safety : The patient has smoke detectors in the home. They wear seatbelts.  There are no firearms at home. There is no violence in the home.   There is no risks for hepatitis, STDs or HIV. There is no   history of blood transfusion. They have no travel history to infectious disease endemic areas of the world.  The patient has seen their dentist in the last six month. They have seen their eye doctor in the last year. They admit to slight hearing difficulty with regard to whispered voices and some television programs.  They have deferred audiologic testing in the last year.  They do not  have excessive sun exposure. Discussed the need for sun protection: hats, long sleeves and use of sunscreen if there is significant sun exposure.   Diet: the importance of a healthy diet is discussed. They do have a healthy diet.  The benefits of regular aerobic exercise were discussed. Tina Silva walks 4 times per week ,  20 minutes.   Depression screen: there are no signs or vegative symptoms of depression- irritability, change in appetite, anhedonia, sadness/tearfullness.  Cognitive assessment: the patient manages all their financial and personal affairs and is actively engaged. They could relate day,date,year and events; recalled 2/3 objects at 3 minutes; performed clock-face test normally.  The following portions of the patient's history were reviewed and updated as appropriate: allergies, current medications, past family  history, past medical history,  past surgical history, past social history  and problem list.  Visual acuity was not assessed per patient preference since Tina Silva has regular follow up with her ophthalmologist. Hearing and body mass index were assessed and reviewed.   During the course of the visit the patient was educated and counseled about appropriate screening and preventive services including : fall prevention , diabetes screening, nutrition counseling, colorectal cancer screening, and recommended immunizations.    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 Diagnoses of Vitamin D deficiency, Hyperlipidemia with target LDL less than 100, Primary hypertension, Paroxysmal supraventricular tachycardia ([place]), and Atrophic vaginitis were also pertinent to this visit.  1) vaginal discharge had an episode  after Christmas,  just 3 episodes in 7 months.  No symptoms .   Not seuxally active,  no swimming or recent abs  2) covid vaccination: felt bad starting 8 hours after 2nd booster  for a few hours  3) pulse was 103 after eating chocolate custard   4) worried about crooked 5th finger on right hand post surgery to remove cyst (hand surgeon )   5) has several red lesions on forearms.  Needs dermatology consult.    6) CKD:  takes citracal,  d 1000,  does not have  NSAIDS.  Uses tylenl   7) using a treadmill on a regular basis,  30 minutes daily, pulse 89  with ambulation    History Tina Silva has a past medical history of Hyperlipidemia, Hypertension, Hypothyroidism, Osteoporosis, and  Vertigo (07/28/2016).   Tina Silva has a past surgical history that includes Abdominal hysterectomy; Appendectomy (1974); Tonsillectomy and adenoidectomy; Eye surgery (june 2013); Tonsillectomy; Colonoscopy with propofol (N/A, 02/12/2016); and Breast biopsy (Left, 11/04/2005).   Her family history includes Cancer in her maternal grandmother and sister; Diabetes in her  paternal aunt and sister; Heart disease in her father; Parkinson's disease in her mother.Tina Silva reports that Tina Silva has never smoked. Tina Silva has never used smokeless tobacco. Tina Silva reports that Tina Silva does not drink alcohol and does not use drugs.  Outpatient Medications Prior to Visit  Medication Sig Dispense Refill   Calcium Carbonate-Vit D-Min (CALTRATE PLUS PO) Take 1,200 mg by mouth daily.     Cholecalciferol (VITAMIN D3) 1000 units CAPS Take 1 capsule by mouth.     metoprolol succinate (TOPROL-XL) 25 MG 24 hr tablet TAKE 1/2 TABLET TWICE A DAY 90 tablet 1   Multiple Vitamin (MULTIVITAMIN) tablet Take 1 tablet by mouth daily.     pravastatin (PRAVACHOL) 20 MG tablet Take 1 tablet (20 mg total) by mouth daily. 90 tablet 3   fluticasone (FLONASE) 50 MCG/ACT nasal spray Place 2 sprays into both nostrils daily. Marland KitchenKEEP ON FILE FOR FUTURE REFILLS (Patient not taking: Reported on 08/08/2020) 16 g 11   meclizine (ANTIVERT) 25 MG tablet Take 1 tablet (25 mg total) by mouth 3 (three) times daily as needed for dizziness. (Patient not taking: Reported on 08/08/2020) 30 tablet 2   No facility-administered medications prior to visit.    Review of Systems  Objective:  BP (!) 130/58 (BP Location: Left Arm, Patient Position: Sitting, Cuff Size: Normal)   Pulse 75   Resp 15   Ht 5\' 3"  (1.6 m)   Wt 138 lb 6.4 oz (62.8 kg)   SpO2 97%   BMI 24.52 kg/m   Physical Exam    Assessment & Plan:   Problem List Items Addressed This Visit       Unprioritized   Atrophic vaginitis    Suggested by history of intermittent vaginal discharge without symptoms.  No workup or treatment  required       Hyperlipidemia with target LDL less than 100    Lipids are at goal on pravastatin.  There is no change in statin therapy  is advised given her age .  Aspirin therapy discontinued given results of ASPREE trial   Lab Results  Component Value Date   CHOL 120 08/06/2019   HDL 45.90 08/06/2019   LDLCALC 49 08/06/2019   TRIG  128.0 08/06/2019   CHOLHDL 3 08/06/2019   Lab Results  Component Value Date   ALT 14 08/06/2019   AST 17 08/06/2019   ALKPHOS 61 08/06/2019   BILITOT 0.7 08/06/2019          Relevant Orders   Lipid panel   Hypertension    Managed with metoprolol without side effects .  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 Selvage's meclizine and fluticasone. I  am also having her maintain her multivitamin, Vitamin D3, Calcium Carbonate-Vit D-Min (CALTRATE PLUS PO), pravastatin, and metoprolol succinate.  No orders of the defined types were placed in this encounter.   Medications Discontinued During This Encounter  Medication Reason   fluticasone (FLONASE) 50 MCG/ACT nasal spray    meclizine (ANTIVERT) 25 MG tablet     Follow-up: Return in about 6 months (around 02/08/2021).   Crecencio Mc, MD

## 2020-08-09 LAB — COMPREHENSIVE METABOLIC PANEL
ALT: 13 U/L (ref 0–35)
AST: 16 U/L (ref 0–37)
Albumin: 4.2 g/dL (ref 3.5–5.2)
Alkaline Phosphatase: 59 U/L (ref 39–117)
BUN: 22 mg/dL (ref 6–23)
CO2: 32 mEq/L (ref 19–32)
Calcium: 9.5 mg/dL (ref 8.4–10.5)
Chloride: 104 mEq/L (ref 96–112)
Creatinine, Ser: 1.02 mg/dL (ref 0.40–1.20)
GFR: 50.84 mL/min — ABNORMAL LOW (ref >60.00)
Glucose, Bld: 86 mg/dL (ref 70–99)
Potassium: 4.3 mEq/L (ref 3.5–5.1)
Sodium: 141 mEq/L (ref 135–145)
Total Bilirubin: 0.5 mg/dL (ref 0.2–1.2)
Total Protein: 5.8 g/dL — ABNORMAL LOW (ref 6.0–8.3)

## 2020-08-09 LAB — LIPID PANEL
Cholesterol: 124 mg/dL (ref 0–200)
HDL: 47.9 mg/dL (ref >39.00)
LDL Cholesterol: 47 mg/dL (ref 0–99)
NonHDL: 76.12
Total CHOL/HDL Ratio: 3
Triglycerides: 146 mg/dL (ref 0.0–149.0)
VLDL: 29.2 mg/dL (ref 0.0–40.0)

## 2020-08-09 LAB — VITAMIN D 25 HYDROXY (VIT D DEFICIENCY, FRACTURES): VITD: 50.51 ng/mL (ref 30.00–100.00)

## 2020-08-09 LAB — TSH: TSH: 2.73 u[IU]/mL (ref 0.35–5.50)

## 2020-08-14 ENCOUNTER — Telehealth: Payer: Self-pay

## 2020-08-14 ENCOUNTER — Other Ambulatory Visit: Payer: Self-pay | Admitting: Internal Medicine

## 2020-08-14 DIAGNOSIS — N1831 Chronic kidney disease, stage 3a: Secondary | ICD-10-CM

## 2020-08-14 NOTE — Telephone Encounter (Signed)
LMTCB in regards to lab results.  

## 2020-08-15 DIAGNOSIS — L821 Other seborrheic keratosis: Secondary | ICD-10-CM | POA: Diagnosis not present

## 2020-08-15 DIAGNOSIS — L570 Actinic keratosis: Secondary | ICD-10-CM | POA: Diagnosis not present

## 2020-08-15 DIAGNOSIS — X32XXXA Exposure to sunlight, initial encounter: Secondary | ICD-10-CM | POA: Diagnosis not present

## 2020-09-14 ENCOUNTER — Other Ambulatory Visit: Payer: Self-pay

## 2020-09-14 ENCOUNTER — Other Ambulatory Visit (INDEPENDENT_AMBULATORY_CARE_PROVIDER_SITE_OTHER): Payer: Medicare Other

## 2020-09-14 DIAGNOSIS — N1831 Chronic kidney disease, stage 3a: Secondary | ICD-10-CM

## 2020-09-14 LAB — CBC WITH DIFFERENTIAL/PLATELET
Basophils Absolute: 0 10*3/uL (ref 0.0–0.1)
Basophils Relative: 0.4 % (ref 0.0–3.0)
Eosinophils Absolute: 0.1 10*3/uL (ref 0.0–0.7)
Eosinophils Relative: 2 % (ref 0.0–5.0)
HCT: 37.9 % (ref 36.0–46.0)
Hemoglobin: 12.8 g/dL (ref 12.0–15.0)
Lymphocytes Relative: 30.3 % (ref 12.0–46.0)
Lymphs Abs: 1.3 10*3/uL (ref 0.7–4.0)
MCHC: 33.7 g/dL (ref 30.0–36.0)
MCV: 101.1 fl — ABNORMAL HIGH (ref 78.0–100.0)
Monocytes Absolute: 0.2 10*3/uL (ref 0.1–1.0)
Monocytes Relative: 5.6 % (ref 3.0–12.0)
Neutro Abs: 2.6 10*3/uL (ref 1.4–7.7)
Neutrophils Relative %: 61.7 % (ref 43.0–77.0)
Platelets: 126 10*3/uL — ABNORMAL LOW (ref 150.0–400.0)
RBC: 3.75 Mil/uL — ABNORMAL LOW (ref 3.87–5.11)
RDW: 12.4 % (ref 11.5–15.5)
WBC: 4.1 10*3/uL (ref 4.0–10.5)

## 2020-09-14 LAB — RENAL FUNCTION PANEL
Albumin: 3.8 g/dL (ref 3.5–5.2)
BUN: 21 mg/dL (ref 6–23)
CO2: 30 mEq/L (ref 19–32)
Calcium: 9.1 mg/dL (ref 8.4–10.5)
Chloride: 104 mEq/L (ref 96–112)
Creatinine, Ser: 0.99 mg/dL (ref 0.40–1.20)
GFR: 52.65 mL/min — ABNORMAL LOW (ref >60.00)
Glucose, Bld: 99 mg/dL (ref 70–99)
Phosphorus: 3.4 mg/dL (ref 2.3–4.6)
Potassium: 4 mEq/L (ref 3.5–5.1)
Sodium: 139 mEq/L (ref 135–145)

## 2020-09-16 LAB — PTH, INTACT AND CALCIUM
Calcium: 9.2 mg/dL (ref 8.6–10.4)
PTH: 22 pg/mL (ref 16–77)

## 2020-09-19 ENCOUNTER — Encounter: Payer: Self-pay | Admitting: Internal Medicine

## 2020-09-19 ENCOUNTER — Other Ambulatory Visit: Payer: Self-pay | Admitting: Internal Medicine

## 2020-09-19 DIAGNOSIS — D696 Thrombocytopenia, unspecified: Secondary | ICD-10-CM | POA: Insufficient documentation

## 2020-11-07 ENCOUNTER — Other Ambulatory Visit: Payer: Self-pay

## 2020-11-07 ENCOUNTER — Ambulatory Visit (INDEPENDENT_AMBULATORY_CARE_PROVIDER_SITE_OTHER): Payer: Medicare Other

## 2020-11-07 DIAGNOSIS — Z23 Encounter for immunization: Secondary | ICD-10-CM | POA: Diagnosis not present

## 2020-11-09 DIAGNOSIS — R42 Dizziness and giddiness: Secondary | ICD-10-CM | POA: Diagnosis not present

## 2020-11-09 DIAGNOSIS — H6123 Impacted cerumen, bilateral: Secondary | ICD-10-CM | POA: Diagnosis not present

## 2020-11-09 DIAGNOSIS — H903 Sensorineural hearing loss, bilateral: Secondary | ICD-10-CM | POA: Diagnosis not present

## 2020-11-23 ENCOUNTER — Other Ambulatory Visit: Payer: Self-pay | Admitting: Internal Medicine

## 2020-11-28 DIAGNOSIS — H16223 Keratoconjunctivitis sicca, not specified as Sjogren's, bilateral: Secondary | ICD-10-CM | POA: Diagnosis not present

## 2020-11-29 ENCOUNTER — Other Ambulatory Visit: Payer: Self-pay | Admitting: Ophthalmology

## 2020-11-29 DIAGNOSIS — R0989 Other specified symptoms and signs involving the circulatory and respiratory systems: Secondary | ICD-10-CM

## 2020-12-07 ENCOUNTER — Ambulatory Visit
Admission: RE | Admit: 2020-12-07 | Discharge: 2020-12-07 | Disposition: A | Discharge status: Home / Self Care | Payer: Medicare Other | Source: Ambulatory Visit | Attending: Ophthalmology | Admitting: Ophthalmology

## 2020-12-07 DIAGNOSIS — R0989 Other specified symptoms and signs involving the circulatory and respiratory systems: Secondary | ICD-10-CM | POA: Diagnosis not present

## 2020-12-07 DIAGNOSIS — I6522 Occlusion and stenosis of left carotid artery: Secondary | ICD-10-CM | POA: Diagnosis not present

## 2020-12-11 ENCOUNTER — Other Ambulatory Visit: Payer: Self-pay | Admitting: Internal Medicine

## 2020-12-12 ENCOUNTER — Other Ambulatory Visit: Payer: Self-pay

## 2020-12-12 ENCOUNTER — Ambulatory Visit (INDEPENDENT_AMBULATORY_CARE_PROVIDER_SITE_OTHER): Payer: Medicare Other | Admitting: Internal Medicine

## 2020-12-12 ENCOUNTER — Encounter: Payer: Self-pay | Admitting: Internal Medicine

## 2020-12-12 VITALS — BP 120/62 | HR 81 | Temp 96.7°F | Ht 63.0 in | Wt 140.4 lb

## 2020-12-12 DIAGNOSIS — G459 Transient cerebral ischemic attack, unspecified: Secondary | ICD-10-CM | POA: Insufficient documentation

## 2020-12-12 DIAGNOSIS — E538 Deficiency of other specified B group vitamins: Secondary | ICD-10-CM

## 2020-12-12 DIAGNOSIS — R404 Transient alteration of awareness: Secondary | ICD-10-CM

## 2020-12-12 DIAGNOSIS — R944 Abnormal results of kidney function studies: Secondary | ICD-10-CM | POA: Diagnosis not present

## 2020-12-12 DIAGNOSIS — D7589 Other specified diseases of blood and blood-forming organs: Secondary | ICD-10-CM | POA: Insufficient documentation

## 2020-12-12 DIAGNOSIS — R42 Dizziness and giddiness: Secondary | ICD-10-CM | POA: Diagnosis not present

## 2020-12-12 DIAGNOSIS — R4182 Altered mental status, unspecified: Secondary | ICD-10-CM | POA: Insufficient documentation

## 2020-12-12 NOTE — Progress Notes (Addendum)
Subjective:  Patient ID: Tina Silva, female    DOB: 07-20-1934  Age: 86 y.o. MRN: [id number]  CC: The primary encounter diagnosis was Macrocytosis. Diagnoses of Dizziness, B12 deficiency, Decreased GFR, Transient alteration of awareness, and Macrocytosis without anemia were also pertinent to this visit.  HPI Tina Silva presents for  evaluation after having an episode of "feeing weird" accompanied by black and white vision that occurred on Sept 24 (6 weeks ago)  while driving.  Had a loss of balance once out of the car, felt herself lean hard to the left .  Did not fall, was able to walk inside by leaning on walls.  Lasted abut 30 minutes.  Previous episode was shorter,  less intense and occurred one month earlier.  Always involved leaning to the left.  No vertigo   Had Eye exam b Dr Thomasene Ripple and ENT exam afterward  no changes . But sent for carotid dopplers last week  < 50% stenosis seen on the left,  none on the right.      Chief Complaint  Patient presents with   Acute Visit    Episodes of "feeling strange"      Outpatient Medications Prior to Visit  Medication Sig Dispense Refill   Calcium Carbonate-Vit D-Min (CALTRATE PLUS PO) Take 1,200 mg by mouth daily.     Cholecalciferol (VITAMIN D3) 1000 units CAPS Take 1 capsule by mouth.     metoprolol succinate (TOPROL-XL) 25 MG 24 hr tablet TAKE 1/2 TABLET TWICE A DAY 90 tablet 1   Multiple Vitamin (MULTIVITAMIN) tablet Take 1 tablet by mouth daily.     pravastatin (PRAVACHOL) 20 MG tablet TAKE 1 TABLET BY MOUTH DAILY. 90 tablet 3   No facility-administered medications prior to visit.    Review of Systems;  Patient denies headache, fevers, malaise, unintentional weight loss, skin rash, eye pain, sinus congestion and sinus pain, sore throat, dysphagia,  hemoptysis , cough, dyspnea, wheezing, chest pain, palpitations, orthopnea, edema, abdominal pain, nausea, melena, diarrhea, constipation, flank pain, dysuria, hematuria,  urinary  Frequency, nocturia, numbness, tingling, seizures,  Focal weakness, Loss of consciousness,  Tremor, insomnia, depression, anxiety, and suicidal ideation.      Objective:  BP 120/62 (BP Location: Left Arm, Patient Position: Sitting, Cuff Size: Normal)   Pulse 81   Temp (!) 96.7 F (35.9 C) (Temporal)   Ht 5\' 3"  (1.6 m)   Wt 140 lb 6.4 oz (63.7 kg)   SpO2 95%   BMI 24.87 kg/m   BP Readings from Last 3 Encounters:  12/12/20 120/62  08/08/20 (!) 130/58  08/06/19 130/74    Wt Readings from Last 3 Encounters:  12/12/20 140 lb 6.4 oz (63.7 kg)  08/08/20 138 lb 6.4 oz (62.8 kg)  04/07/20 139 lb (63 kg)    General appearance: alert, cooperative and appears stated age Ears: normal TM's and external ear canals both ears Throat: lips, mucosa, and tongue normal; teeth and gums normal Neck: no adenopathy, no carotid bruit, supple, symmetrical, trachea midline and thyroid not enlarged, symmetric, no tenderness/mass/nodules Back: symmetric, no curvature. ROM normal. No CVA tenderness. Lungs: clear to auscultation bilaterally Heart: regular rate and rhythm, S1, S2 normal, no murmur, click, rub or gallop Abdomen: soft, non-tender; bowel sounds normal; no masses,  no organomegaly Pulses: 2+ and symmetric Skin: Skin color, texture, turgor normal. No rashes or lesions Lymph nodes: Cervical, supraclavicular, and axillary nodes normal. Neuro: CNs 2-12 intact. DTRs 2+/4 in biceps, brachioradialis, patellars and achilles. Muscle  strength 5/5 in upper and lower exremities. Fine resting tremor bilaterally both hands cerebellar function normal. Romberg negative.  No pronator drift.   Gait normal.   Lab Results  Component Value Date   HGBA1C 5.2 07/03/2017   HGBA1C 5.6 01/10/2012    Lab Results  Component Value Date   CREATININE 0.99 09/14/2020   CREATININE 1.02 08/08/2020   CREATININE 0.91 08/06/2019    Lab Results  Component Value Date   WBC 4.1 09/14/2020   HGB 12.8 09/14/2020    HCT 37.9 09/14/2020   PLT 126.0 (L) 09/14/2020   GLUCOSE 99 09/14/2020   CHOL 124 08/08/2020   TRIG 146.0 08/08/2020   HDL 47.90 08/08/2020   LDLCALC 47 08/08/2020   ALT 13 08/08/2020   AST 16 08/08/2020   NA 139 09/14/2020   K 4.0 09/14/2020   CL 104 09/14/2020   CREATININE 0.99 09/14/2020   BUN 21 09/14/2020   CO2 30 09/14/2020   TSH 2.73 08/08/2020   HGBA1C 5.2 07/03/2017    US Carotid Bilateral  Result Date: 12/08/2020 CLINICAL DATA:  Carotid bruit EXAM: BILATERAL CAROTID DUPLEX ULTRASOUND TECHNIQUE: Pearline Cables scale imaging, color Doppler and duplex ultrasound were performed of bilateral carotid and vertebral arteries in the neck. COMPARISON:  None. FINDINGS: Criteria: Quantification of carotid stenosis is based on velocity parameters that correlate the residual internal carotid diameter with NASCET-based stenosis levels, using the diameter of the distal internal carotid lumen as the denominator for stenosis measurement. The following velocity measurements were obtained: RIGHT ICA: 132/34 cm/sec CCA: 50/09 cm/sec SYSTOLIC ICA/CCA RATIO:  1.4 ECA:  97 cm/sec LEFT ICA: 162/45 cm/sec CCA: 38/18 cm/sec SYSTOLIC ICA/CCA RATIO:  1.7 ECA:  101 cm/sec RIGHT CAROTID ARTERY: No significant atherosclerotic plaque or evidence of stenosis in the internal carotid artery. RIGHT VERTEBRAL ARTERY:  Patent with normal antegrade flow. LEFT CAROTID ARTERY: Trace smooth heterogeneous atherosclerotic plaque in the proximal internal carotid artery. By peak systolic velocity criteria in the region of plaque, the estimated stenosis is less than 50%. Larger elevation of peak systolic velocity in the more distal internal carotid artery is noted incidentally and may be spurious. LEFT VERTEBRAL ARTERY:  Patent with normal antegrade flow. IMPRESSION: 1. Mild (1-49%) stenosis proximal left internal carotid artery secondary to trace smooth heterogeneous atherosclerotic plaque. 2. No significant plaque or evidence of  stenosis in the right internal carotid artery. 3. Both vertebral arteries are patent with normal antegrade flow. Electronically Signed   By: Jacqulynn Cadet M.D.   On: 12/08/2020 05:28    Assessment & Plan:   Problem List Items Addressed This Visit     Altered mental status, unspecified    She has had two episodes of loss of balance (listing to the left)  with the second one being more severe,  which included transient loss of color vision.  Neuro exam is normal today .  reviwed prior MRI brain in 2018 which was normal.   ddx includes MS,  Mass and TIA.  MRI brain ordered.       Relevant Orders   MR Brain Wo Contrast   Macrocytosis without anemia    Checking B12 level       Other Visit Diagnoses     Macrocytosis    -  Primary   Dizziness       Relevant Orders   MR Brain Wo Contrast   B12 deficiency       Relevant Orders   B12 and Folate Panel   Decreased  GFR       Relevant Orders   Basic metabolic panel       I provided  30 minutes of face-to-face time during this encounter reviewing patient's current problems and past surgeries, labs and imaging studies, providing counseling on the above mentioned problems , and coordination  of care .  There are no discontinued medications.  Follow-up: No follow-ups on file.   Crecencio Mc, MD

## 2020-12-12 NOTE — Assessment & Plan Note (Signed)
Checking B12 level

## 2020-12-12 NOTE — Patient Instructions (Signed)
I'm ordering an MRI of your brain to rule out a stroke as the cause of your recent vision change and balance issue  I will also make a neurology referral for follow up  Please return for non fasting labs this week or next   If you have another episode,  go straight to the ER

## 2020-12-12 NOTE — Assessment & Plan Note (Addendum)
She has had two episodes of loss of balance (listing to the left)  with the second one being more severe,  which included transient loss of color vision.  Neuro exam is normal today .  reviwed prior MRI brain in 2018 which was normal.   ddx includes MS,  Mass and TIA.  MRI brain ordered.

## 2020-12-13 NOTE — Addendum Note (Signed)
Addended by: Leeanne Rio on: 12/13/2020 01:01 PM   Modules accepted: Orders

## 2020-12-14 ENCOUNTER — Telehealth: Payer: Self-pay | Admitting: Internal Medicine

## 2020-12-14 NOTE — Telephone Encounter (Signed)
Pt called in stating that Dr. Derrel Nip advise Pt she had to do an MRI. Pt stated that Dr. Derrel Nip advise her if she didn't hear back about her Mri to give a callback to the office. Pt called in stating that she never received a phone call about her MRI.

## 2020-12-15 NOTE — Telephone Encounter (Signed)
Don't see an order for an MRI in the chart

## 2020-12-15 NOTE — Addendum Note (Signed)
Addended by: Crecencio Mc on: 12/15/2020 01:33 PM   Modules accepted: Orders

## 2020-12-18 NOTE — Telephone Encounter (Signed)
Called pt. Phone was answered no one spoke.

## 2020-12-25 NOTE — Telephone Encounter (Signed)
Pt has been scheduled for MRI.

## 2020-12-26 ENCOUNTER — Other Ambulatory Visit (INDEPENDENT_AMBULATORY_CARE_PROVIDER_SITE_OTHER): Payer: Medicare Other

## 2020-12-26 ENCOUNTER — Other Ambulatory Visit: Payer: Self-pay

## 2020-12-26 DIAGNOSIS — R944 Abnormal results of kidney function studies: Secondary | ICD-10-CM

## 2020-12-26 DIAGNOSIS — R42 Dizziness and giddiness: Secondary | ICD-10-CM

## 2020-12-26 DIAGNOSIS — R404 Transient alteration of awareness: Secondary | ICD-10-CM

## 2020-12-26 DIAGNOSIS — N1831 Chronic kidney disease, stage 3a: Secondary | ICD-10-CM

## 2020-12-26 DIAGNOSIS — E538 Deficiency of other specified B group vitamins: Secondary | ICD-10-CM

## 2020-12-27 LAB — BASIC METABOLIC PANEL
BUN: 19 mg/dL (ref 6–23)
CO2: 34 mEq/L — ABNORMAL HIGH (ref 19–32)
Calcium: 9.3 mg/dL (ref 8.4–10.5)
Chloride: 104 mEq/L (ref 96–112)
Creatinine, Ser: 1.09 mg/dL (ref 0.40–1.20)
GFR: 46.82 mL/min — ABNORMAL LOW (ref >60.00)
Glucose, Bld: 92 mg/dL (ref 70–99)
Potassium: 4.1 mEq/L (ref 3.5–5.1)
Sodium: 143 mEq/L (ref 135–145)

## 2020-12-27 LAB — B12 AND FOLATE PANEL
Folate: >23.4 ng/mL (ref >5.9)
Vitamin B-12: 759 pg/mL (ref 211–911)

## 2020-12-28 ENCOUNTER — Encounter: Payer: Self-pay | Admitting: Internal Medicine

## 2020-12-28 DIAGNOSIS — N1831 Chronic kidney disease, stage 3a: Secondary | ICD-10-CM | POA: Insufficient documentation

## 2020-12-28 DIAGNOSIS — N183 Chronic kidney disease, stage 3 unspecified: Secondary | ICD-10-CM | POA: Insufficient documentation

## 2020-12-28 NOTE — Assessment & Plan Note (Signed)
GFR has been < 50 for the past 2 years .  Nephrology referral advised and in process

## 2020-12-28 NOTE — Addendum Note (Signed)
Addended by: Crecencio Mc on: 12/28/2020 03:08 PM   Modules accepted: Orders

## 2020-12-29 ENCOUNTER — Ambulatory Visit
Admission: RE | Admit: 2020-12-29 | Discharge: 2020-12-29 | Disposition: A | Discharge status: Home / Self Care | Payer: Medicare Other | Source: Ambulatory Visit | Attending: Internal Medicine | Admitting: Internal Medicine

## 2020-12-29 DIAGNOSIS — R42 Dizziness and giddiness: Secondary | ICD-10-CM | POA: Diagnosis not present

## 2020-12-29 DIAGNOSIS — G319 Degenerative disease of nervous system, unspecified: Secondary | ICD-10-CM | POA: Diagnosis not present

## 2020-12-29 DIAGNOSIS — R404 Transient alteration of awareness: Secondary | ICD-10-CM | POA: Insufficient documentation

## 2021-01-01 ENCOUNTER — Encounter: Payer: Self-pay | Admitting: Neurology

## 2021-01-01 ENCOUNTER — Telehealth: Payer: Self-pay | Admitting: Internal Medicine

## 2021-01-01 DIAGNOSIS — R404 Transient alteration of awareness: Secondary | ICD-10-CM

## 2021-01-01 DIAGNOSIS — R930 Abnormal findings on diagnostic imaging of skull and head, not elsewhere classified: Secondary | ICD-10-CM

## 2021-01-01 NOTE — Addendum Note (Signed)
Addended by: Crecencio Mc on: 01/01/2021 12:57 PM   Modules accepted: Orders

## 2021-01-01 NOTE — Telephone Encounter (Signed)
Patient is calling Dr Derrel Nip back to let her know that, yes she would like a referral in reference to her MRI she had.

## 2021-01-04 DIAGNOSIS — Z20822 Contact with and (suspected) exposure to covid-19: Secondary | ICD-10-CM | POA: Diagnosis not present

## 2021-01-30 ENCOUNTER — Encounter: Payer: Self-pay | Admitting: Neurology

## 2021-01-30 ENCOUNTER — Telehealth: Payer: Self-pay

## 2021-01-30 ENCOUNTER — Ambulatory Visit (INDEPENDENT_AMBULATORY_CARE_PROVIDER_SITE_OTHER): Payer: Medicare Other | Admitting: Neurology

## 2021-01-30 ENCOUNTER — Other Ambulatory Visit: Payer: Self-pay

## 2021-01-30 VITALS — BP 150/75 | HR 74 | Ht 63.0 in | Wt 140.2 lb

## 2021-01-30 DIAGNOSIS — G459 Transient cerebral ischemic attack, unspecified: Secondary | ICD-10-CM

## 2021-01-30 NOTE — Telephone Encounter (Signed)
MRA head wo contrast scheduled at [facility] for 02/09/2021 at 7:30. Patient notified. No prior needed

## 2021-01-30 NOTE — Progress Notes (Signed)
NEUROLOGY CONSULTATION NOTE  AJANAY FARVE MRN: [id number] DOB: [birth year]  Referring provider: Dr. Deborra Medina Primary care provider: Dr. Deborra Medina  Reason for consult:  abnormal brain MRI  Dear Dr Derrel Nip:  Thank you for your kind referral of [facility] for consultation of the above symptoms. Although her history is well known to you, please allow me to reiterate it for the purpose of our medical record. The patient was accompanied to the clinic by her friend Ebbie Latus. Records and images were personally reviewed where available.   HISTORY OF PRESENT ILLNESS: This is a pleasant 86 year old left-handed woman with a history of hypertension, hyperlipidemia, hypothyroidism, macrocytosis, presenting for evaluation of abnormal brain MRI. MRI brain was ordered due to 2 episodes of imbalance. The first episode occurred in 08/2020, she was seated in her living and got up, then noticed she kept bumping the wall on her left side. This lasted for a day then resolved. There were no other associated symptoms. A month later on 10/21/20, she drove to the end of her street then her head hurt real bad on the vertex for a few seconds. She continued driving then noticed that "everything was black and white, there was no color, just like a black veil in front of me." She drove back home and as she got out of the car, she almost fell. Her balance was "really, really bad on the left," she had to hold on until she got inside. She closed her eyes, and 15 minutes later, color and balance was back. She denied feeling dizzy, no spinning/lightheaded sensation, nausea/vomiting, no diplopia, dysarthria/dysphagia, focal numbness/tingling/weakness, bowel/bladder dysfunction. No loss of awareness. She denies any olfactory/gustatory hallucinations, myoclonic jerks. Sleep is good. She had seen her eye doctor and ENT with normal exams. She denies any further head pains, no recent head injuries, infections, or medication  changes. No family history of similar symptoms.   Carotid dopplers in 11/2020 no significant stenosis. She had an MRI brain without contrast on 12/30/20 which I personally reviewed, no acute changes. There was moderate chronic microvascular disease, no change from 2018 imaging.   Laboratory Data: Lab Results  Component Value Date   CHOL 124 08/08/2020   HDL 47.90 08/08/2020   LDLCALC 47 08/08/2020   TRIG 146.0 08/08/2020   CHOLHDL 3 08/08/2020   Lab Results  Component Value Date   HGBA1C 5.2 07/03/2017      PAST MEDICAL HISTORY: Past Medical History:  Diagnosis Date   Hyperlipidemia    Hypertension    Hypothyroidism    Osteoporosis    Vertigo 07/28/2016    PAST SURGICAL HISTORY: Past Surgical History:  Procedure Laterality Date   ABDOMINAL HYSTERECTOMY     APPENDECTOMY  1974   BREAST BIOPSY Left 11/04/2005   core   COLONOSCOPY WITH PROPOFOL N/A 02/12/2016   Procedure: COLONOSCOPY WITH PROPOFOL;  Surgeon: Manya Silvas, MD;  Location: [place] [place];  Service: Endoscopy;  Laterality: N/A;   EYE SURGERY  june 2013   cataract with lens,  dinglein left eye    TONSILLECTOMY     TONSILLECTOMY AND ADENOIDECTOMY      MEDICATIONS: Current Outpatient Medications on File Prior to Visit  Medication Sig Dispense Refill   Calcium Carbonate-Vit D-Min (CALTRATE PLUS PO) Take 1,200 mg by mouth daily.     Cholecalciferol (VITAMIN D3) 1000 units CAPS Take 1 capsule by mouth.     metoprolol succinate (TOPROL-XL) 25 MG 24 hr tablet TAKE 1/2 TABLET  TWICE A DAY 90 tablet 1   Multiple Vitamin (MULTIVITAMIN) tablet Take 1 tablet by mouth daily.     pravastatin (PRAVACHOL) 20 MG tablet TAKE 1 TABLET BY MOUTH DAILY. 90 tablet 3   No current facility-administered medications on file prior to visit.    ALLERGIES: Allergies  Allergen Reactions   Lidocaine-Epinephrine Other (See Comments)    Chest tightness   Ciprofloxacin     Causes heart beat to skip   Doxycycline Other (See  Comments)    Chest pain   Flagyl [Metronidazole]     Causes heart beat to skip   Iodine     Hot and tremmors   Sudafed [Pseudoephedrine Hcl]     Felt like her body would explode.    FAMILY HISTORY: Family History  Problem Relation Age of Onset   Parkinson's disease Mother    Cancer Sister        AML   Diabetes Sister    Heart disease Father    Diabetes Paternal Aunt    Cancer Maternal Grandmother    Breast cancer Neg Hx     SOCIAL HISTORY: Social History   Socioeconomic History   Marital status: Single    Spouse name: Not on file   Number of children: Not on file   Years of education: Not on file   Highest education level: Not on file  Occupational History   Not on file  Tobacco Use   Smoking status: Never   Smokeless tobacco: Never  Vaping Use   Vaping Use: Never used  Substance and Sexual Activity   Alcohol use: No   Drug use: No   Sexual activity: Never  Other Topics Concern   Not on file  Social History Narrative   Independent at baseline   Left handed    Social Determinants of Health   Financial Resource Strain: Low Risk    Difficulty of Paying Living Expenses: Not hard at all  Food Insecurity: No Food Insecurity   Worried About Charity fundraiser in the Last Year: Never true   Surprise in the Last Year: Never true  Transportation Needs: No Transportation Needs   Lack of Transportation (Medical): No   Lack of Transportation (Non-Medical): No  Physical Activity: Insufficiently Active   Days of Exercise per Week: 2 days   Minutes of Exercise per Session: 60 min  Stress: No Stress Concern Present   Feeling of Stress : Not at all  Social Connections: Unknown   Frequency of Communication with Friends and Family: More than three times a week   Frequency of Social Gatherings with Friends and Family: Not on file   Attends Religious Services: Not on Electrical engineer or Organizations: Not on file   Attends Archivist  Meetings: Not on file   Marital Status: Not on file  Intimate Partner Violence: Not At Risk   Fear of Current or Ex-Partner: No   Emotionally Abused: No   Physically Abused: No   Sexually Abused: No     PHYSICAL EXAM: Vitals:   01/30/21 1009  BP: (!) 150/75  Pulse: 74  SpO2: 98%   General: No acute distress Head:  Normocephalic/atraumatic Skin/Extremities: No rash, no edema Neurological Exam: Mental status: alert and awake, no dysarthria or aphasia, Fund of knowledge is appropriate.  Recent and remote memory are intact.  Attention and concentration are normal.     Cranial nerves: CN I: not tested CN II:  pupils equal, round and reactive to light, visual fields intact CN III, IV, VI:  full range of motion, no nystagmus, no ptosis CN V: facial sensation intact CN VII: upper and lower face symmetric CN VIII: hearing intact to conversation Bulk & Tone: normal, no fasciculations. Motor: 5/5 throughout with no pronator drift. Sensation: intact to light touch, cold, pin, vibration and joint position sense.  No extinction to double simultaneous stimulation.  Romberg test negative Deep Tendon Reflexes: +2 on both UE, unable to elicit on both LE Cerebellar: no incoordination on finger to nose testing Gait: narrow-based and steady, difficulty with tandem walk Tremor: none   IMPRESSION: This is a pleasant 87 year old left-handed woman with a history of hypertension, hyperlipidemia, hypothyroidism, macrocytosis, presenting for evaluation of abnormal brain MRI. We discussed her brain MRI, there is moderate chronic microvascular disease, no acute changes, no changes to explain the 2 episodes she had in August and September 2022 of imbalance to the left. The second episode was associated with loss of color vision. Her neurological exam today is normal. Etiology of episodes unclear, concerning for possible posterior circulation TIA, less likely seizure. Carotid dopplers unremarkable. MRA head  without contrast will be ordered to assess for intracranial stenosis. She was advised to start aspirin 81mg  daily, continue control of vascular risk factors. EEG will be done for completion. We discussed that if symptoms recur, she should go to the ER immediately. Follow-up in 6 months, call for any changes.   Thank you for allowing me to participate in the care of this patient. Please do not hesitate to call for any questions or concerns.   Tina Silva, M.D.  CC: Dr. Derrel Nip

## 2021-01-30 NOTE — Patient Instructions (Addendum)
Schedule MRA head without contrast at [facility]  2. Schedule EEG  3. Start daily aspirin 81mg   4. If symptoms recur, go to ER immediately  5. Follow-up in 6 months, call for any changes

## 2021-02-08 ENCOUNTER — Ambulatory Visit (INDEPENDENT_AMBULATORY_CARE_PROVIDER_SITE_OTHER): Payer: Medicare Other | Admitting: Internal Medicine

## 2021-02-08 ENCOUNTER — Other Ambulatory Visit: Payer: Self-pay

## 2021-02-08 ENCOUNTER — Encounter: Payer: Self-pay | Admitting: Internal Medicine

## 2021-02-08 VITALS — BP 136/76 | HR 63 | Temp 98.5°F | Ht 63.0 in | Wt 138.6 lb

## 2021-02-08 DIAGNOSIS — N1831 Chronic kidney disease, stage 3a: Secondary | ICD-10-CM

## 2021-02-08 DIAGNOSIS — D696 Thrombocytopenia, unspecified: Secondary | ICD-10-CM

## 2021-02-08 DIAGNOSIS — E785 Hyperlipidemia, unspecified: Secondary | ICD-10-CM | POA: Diagnosis not present

## 2021-02-08 DIAGNOSIS — I10 Essential (primary) hypertension: Secondary | ICD-10-CM

## 2021-02-08 DIAGNOSIS — G459 Transient cerebral ischemic attack, unspecified: Secondary | ICD-10-CM | POA: Diagnosis not present

## 2021-02-08 LAB — COMPREHENSIVE METABOLIC PANEL
ALT: 14 U/L (ref 0–35)
AST: 17 U/L (ref 0–37)
Albumin: 4.2 g/dL (ref 3.5–5.2)
Alkaline Phosphatase: 62 U/L (ref 39–117)
BUN: 19 mg/dL (ref 6–23)
CO2: 33 mEq/L — ABNORMAL HIGH (ref 19–32)
Calcium: 9.1 mg/dL (ref 8.4–10.5)
Chloride: 103 mEq/L (ref 96–112)
Creatinine, Ser: 0.93 mg/dL (ref 0.40–1.20)
GFR: 56.59 mL/min — ABNORMAL LOW (ref >60.00)
Glucose, Bld: 87 mg/dL (ref 70–99)
Potassium: 4.1 mEq/L (ref 3.5–5.1)
Sodium: 141 mEq/L (ref 135–145)
Total Bilirubin: 0.7 mg/dL (ref 0.2–1.2)
Total Protein: 6 g/dL (ref 6.0–8.3)

## 2021-02-08 LAB — MICROALBUMIN / CREATININE URINE RATIO
Creatinine,U: 35.4 mg/dL
Microalb Creat Ratio: 2 mg/g (ref 0.0–30.0)
Microalb, Ur: <0.7 mg/dL (ref 0.0–1.9)

## 2021-02-08 LAB — LIPID PANEL
Cholesterol: 131 mg/dL (ref 0–200)
HDL: 49.9 mg/dL (ref >39.00)
LDL Cholesterol: 55 mg/dL (ref 0–99)
NonHDL: 81.16
Total CHOL/HDL Ratio: 3
Triglycerides: 131 mg/dL (ref 0.0–149.0)
VLDL: 26.2 mg/dL (ref 0.0–40.0)

## 2021-02-08 NOTE — Assessment & Plan Note (Signed)
Scheduled to see Nephrology.  Reviewed need to avoid NSAIDS, which she is doing.  She has increased her water intake to 50-60 ounces daily

## 2021-02-08 NOTE — Assessment & Plan Note (Signed)
She is taking a low potency statin and LDL is <70.  No changes today  Lab Results  Component Value Date   CHOL 131 02/08/2021   HDL 49.90 02/08/2021   LDLCALC 55 02/08/2021   TRIG 131.0 02/08/2021   CHOLHDL 3 02/08/2021

## 2021-02-08 NOTE — Assessment & Plan Note (Signed)
Mild,   Stable.  Rechecking since asa was added by neurology.  Lab Results  Component Value Date   WBC 4.1 09/14/2020   HGB 12.8 09/14/2020   HCT 37.9 09/14/2020   MCV 101.1 (H) 09/14/2020   PLT 126.0 (L) 09/14/2020

## 2021-02-08 NOTE — Assessment & Plan Note (Addendum)
BP is at  goal on metoprolol only.  she has no  proteinuria   Lab Results  Component Value Date   MICROALBUR <0.7 02/08/2021   Lab Results  Component Value Date   CREATININE 0.93 02/08/2021

## 2021-02-08 NOTE — Progress Notes (Signed)
Subjective:  Patient ID: Tina Silva, female    DOB: 1934/07/07  Age: 87 y.o. MRN: [id number]  CC: The primary encounter diagnosis was Primary hypertension. Diagnoses of Hyperlipidemia with target LDL less than 100, TIA (transient ischemic attack), Stage 3a chronic kidney disease (CKD) ([place]), and Thrombocytopenia ([place]) were also pertinent to this visit.   This visit occurred during the SARS-CoV-2 public health emergency.  Safety protocols were in place, including screening questions prior to the visit, additional usage of staff PPE, and extensive cleaning of exam room while observing appropriate contact time as indicated for disinfecting solutions.    HPI Tina Silva presents for  Chief Complaint  Patient presents with   Follow-up    6 month follow up    Follow up on on  recent transient neurologic event   .  Has been diagnosed with  TIA by Neurology,  MRA done ,  EEG ordered . Taking baby asa daily without side effects   Increased anxiety since event.  Notices that her trombone playing is not as strong .  She has not had any panic attacks or insomnia .    CKD:  referred to nephrology in Dec.  Awaiting appt.  Has increased water intake .    Hyperlipidemia:  taking pravastatin .Marland Kitchen  breakfast was at 5:30    Outpatient Medications Prior to Visit  Medication Sig Dispense Refill   aspirin EC 81 MG tablet Take 81 mg by mouth daily. Swallow whole.     Calcium Carbonate-Vit D-Min (CALTRATE PLUS PO) Take 1,200 mg by mouth daily.     Cholecalciferol (VITAMIN D3) 1000 units CAPS Take 1 capsule by mouth.     metoprolol succinate (TOPROL-XL) 25 MG 24 hr tablet TAKE 1/2 TABLET TWICE A DAY 90 tablet 1   Multiple Vitamin (MULTIVITAMIN) tablet Take 1 tablet by mouth daily.     pravastatin (PRAVACHOL) 20 MG tablet TAKE 1 TABLET BY MOUTH DAILY. 90 tablet 3   No facility-administered medications prior to visit.    Review of Systems;  Patient denies headache, fevers, malaise, unintentional  weight loss, skin rash, eye pain, sinus congestion and sinus pain, sore throat, dysphagia,  hemoptysis , cough, dyspnea, wheezing, chest pain, palpitations, orthopnea, edema, abdominal pain, nausea, melena, diarrhea, constipation, flank pain, dysuria, hematuria, urinary  Frequency, nocturia, numbness, tingling, seizures,  Focal weakness, Loss of consciousness,  Tremor, insomnia, depression, anxiety, and suicidal ideation.      Objective:  BP 136/76 (BP Location: Left Arm, Patient Position: Sitting, Cuff Size: Normal)    Pulse 63    Temp 98.5 F (36.9 C) (Oral)    Ht '5\' 3"'  (1.6 m)    Wt 138 lb 9.6 oz (62.9 kg)    SpO2 98%    BMI 24.55 kg/m   BP Readings from Last 3 Encounters:  02/08/21 136/76  01/30/21 (!) 150/75  12/12/20 120/62    Wt Readings from Last 3 Encounters:  02/08/21 138 lb 9.6 oz (62.9 kg)  01/30/21 140 lb 3.2 oz (63.6 kg)  12/12/20 140 lb 6.4 oz (63.7 kg)    General appearance: alert, cooperative and appears stated age Ears: normal TM's and external ear canals both ears Throat: lips, mucosa, and tongue normal; teeth and gums normal Neck: no adenopathy, no carotid bruit, supple, symmetrical, trachea midline and thyroid not enlarged, symmetric, no tenderness/mass/nodules Back: symmetric, no curvature. ROM normal. No CVA tenderness. Lungs: clear to auscultation bilaterally Heart: regular rate and rhythm, S1, S2 normal, no murmur,  click, rub or gallop Abdomen: soft, non-tender; bowel sounds normal; no masses,  no organomegaly Pulses: 2+ and symmetric Skin: Skin color, texture, turgor normal. No rashes or lesions Lymph nodes: Cervical, supraclavicular, and axillary nodes normal.  Lab Results  Component Value Date   HGBA1C 5.2 07/03/2017   HGBA1C 5.6 01/10/2012    Lab Results  Component Value Date   CREATININE 0.93 02/08/2021   CREATININE 1.09 12/26/2020   CREATININE 0.99 09/14/2020    Lab Results  Component Value Date   WBC 4.1 09/14/2020   HGB 12.8 09/14/2020    HCT 37.9 09/14/2020   PLT 126.0 (L) 09/14/2020   GLUCOSE 87 02/08/2021   CHOL 131 02/08/2021   TRIG 131.0 02/08/2021   HDL 49.90 02/08/2021   LDLCALC 55 02/08/2021   ALT 14 02/08/2021   AST 17 02/08/2021   NA 141 02/08/2021   K 4.1 02/08/2021   CL 103 02/08/2021   CREATININE 0.93 02/08/2021   BUN 19 02/08/2021   CO2 33 (H) 02/08/2021   TSH 2.73 08/08/2020   HGBA1C 5.2 07/03/2017   MICROALBUR <0.7 02/08/2021    MR Brain Wo Contrast  Result Date: 12/30/2020 CLINICAL DATA:  TIA. Episode lasting 30 minutes on September 24th with dizziness and imbalance. EXAM: MRI HEAD WITHOUT CONTRAST TECHNIQUE: Multiplanar, multiecho pulse sequences of the brain and surrounding structures were obtained without intravenous contrast. COMPARISON:  MR head without contrast 07/28/2016 FINDINGS: Brain: No acute infarct, hemorrhage, or mass lesion is present. Moderate subcortical T2 hyperintensities bilaterally are similar the prior exam. No new lesions are present. Cerebellum is unremarkable. Minimal periventricular changes are present. Mild atrophy is stable. The ventricles are of normal size. No significant extraaxial fluid collection is present. The internal auditory canals are within normal limits. Vascular: Flow is present in the major intracranial arteries. Skull and upper cervical spine: The craniocervical junction is normal. Upper cervical spine is within normal limits. Marrow signal is unremarkable. Sinuses/Orbits: The paranasal sinuses and mastoid air cells are clear. Left lens replaced. Globes and orbits are otherwise within normal limits. IMPRESSION: 1. No acute intracranial abnormality or significant interval change. 2. Stable moderate subcortical T2 hyperintensities bilaterally. The finding is nonspecific but can be seen in the setting of chronic microvascular ischemia, a demyelinating process such as multiple sclerosis, vasculitis, complicated migraine headaches, or as the sequelae of a prior  infectious or inflammatory process. Electronically Signed   By: San Morelle M.D.   On: 12/30/2020 17:14    Assessment & Plan:   Problem List Items Addressed This Visit     Hypertension - Primary    BP is at  goal on metoprolol only.  she has no  proteinuria   Lab Results  Component Value Date   MICROALBUR <0.7 02/08/2021   Lab Results  Component Value Date   CREATININE 0.93 02/08/2021           Relevant Medications   aspirin EC 81 MG tablet   Other Relevant Orders   Comp Met (CMET) (Completed)   Urine Microalbumin w/creat. ratio (Completed)   Hyperlipidemia with target LDL less than 100    She is taking a low potency statin and LDL is <70.  No changes today  Lab Results  Component Value Date   CHOL 131 02/08/2021   HDL 49.90 02/08/2021   LDLCALC 55 02/08/2021   TRIG 131.0 02/08/2021   CHOLHDL 3 02/08/2021         Relevant Medications   aspirin EC 81 MG tablet  Other Relevant Orders   Lipid panel (Completed)   Thrombocytopenia (HCC)    Mild,   Stable.  Rechecking since asa was added by neurology.  Lab Results  Component Value Date   WBC 4.1 09/14/2020   HGB 12.8 09/14/2020   HCT 37.9 09/14/2020   MCV 101.1 (H) 09/14/2020   PLT 126.0 (L) 09/14/2020         Relevant Orders   CBC with Differential/Platelet   TIA (transient ischemic attack)    Continue  neurologic workup for Seizure< MS  And  CVD.  She is now  Taking a baby aspirin daily and tolerating it.  No subsequent events thus far .        Relevant Medications   aspirin EC 81 MG tablet   Stage 3a chronic kidney disease (CKD) ([place])    Scheduled to see Nephrology.  Reviewed need to avoid NSAIDS, which she is doing.  She has increased her water intake to 50-60 ounces daily       I am having Nonda Lou. Rankin maintain her multivitamin, Vitamin D3, Calcium Carbonate-Vit D-Min (CALTRATE PLUS PO), metoprolol succinate, pravastatin, and aspirin EC.  No orders of the defined types were  placed in this encounter.    I provided  30 minutes of  face-to-face time during this encounter reviewing patient's current problems and past surgeries, labs and imaging studies, providing counseling on the above mentioned problems , and coordination  of care .   Follow-up: No follow-ups on file.   Crecencio Mc, MD

## 2021-02-08 NOTE — Patient Instructions (Addendum)
°  The Tetanus vaccine is now COVERED BY MEDICARE if you get  it at your pharmacy and you are due next month   Your blood pressure is at goal  I agree with taking a baby aspirin daily to prevent strokes

## 2021-02-08 NOTE — Assessment & Plan Note (Addendum)
Continue  neurologic workup for Seizure< MS  And  CVD.  She is now  Taking a baby aspirin daily and tolerating it.  No subsequent events thus far .

## 2021-02-09 ENCOUNTER — Ambulatory Visit
Admission: RE | Admit: 2021-02-09 | Discharge: 2021-02-09 | Disposition: A | Discharge status: Home / Self Care | Payer: Medicare Other | Source: Ambulatory Visit | Attending: Neurology | Admitting: Neurology

## 2021-02-09 DIAGNOSIS — I672 Cerebral atherosclerosis: Secondary | ICD-10-CM | POA: Diagnosis not present

## 2021-02-09 DIAGNOSIS — G459 Transient cerebral ischemic attack, unspecified: Secondary | ICD-10-CM | POA: Insufficient documentation

## 2021-02-21 ENCOUNTER — Other Ambulatory Visit: Payer: Medicare Other

## 2021-02-21 ENCOUNTER — Other Ambulatory Visit: Payer: Self-pay | Admitting: Nephrology

## 2021-02-21 DIAGNOSIS — R82998 Other abnormal findings in urine: Secondary | ICD-10-CM | POA: Diagnosis not present

## 2021-02-21 DIAGNOSIS — I10 Essential (primary) hypertension: Secondary | ICD-10-CM

## 2021-02-21 DIAGNOSIS — N1831 Chronic kidney disease, stage 3a: Secondary | ICD-10-CM

## 2021-02-22 ENCOUNTER — Ambulatory Visit (INDEPENDENT_AMBULATORY_CARE_PROVIDER_SITE_OTHER): Payer: Medicare Other | Admitting: Neurology

## 2021-02-22 ENCOUNTER — Other Ambulatory Visit: Payer: Self-pay

## 2021-02-22 DIAGNOSIS — R2990 Unspecified symptoms and signs involving the nervous system: Secondary | ICD-10-CM

## 2021-02-22 DIAGNOSIS — G459 Transient cerebral ischemic attack, unspecified: Secondary | ICD-10-CM

## 2021-02-22 DIAGNOSIS — H539 Unspecified visual disturbance: Secondary | ICD-10-CM

## 2021-02-22 DIAGNOSIS — R29818 Other symptoms and signs involving the nervous system: Secondary | ICD-10-CM

## 2021-02-22 DIAGNOSIS — R2689 Other abnormalities of gait and mobility: Secondary | ICD-10-CM

## 2021-02-28 NOTE — Procedures (Signed)
ELECTROENCEPHALOGRAM REPORT  Date of Study: 02/22/2021  Patient's Name: Tina Silva MRN: [id number] Date of Birth: [birth year]  Referring Provider: Dr. Ellouise Newer  Clinical History: This is an 87 year old woman with 2 episodes of imbalance, one with vision changes. EEG for classification.  Medications: CALTRATE PLUS PO VITAMIN D3 1000 units CAPS TOPROL-XL 25 MG 24 hr tablet MULTIVITAMIN tablet PRAVACHOL 20 MG tablet  Technical Summary: A multichannel digital EEG recording measured by the international 10-20 system with electrodes applied with paste and impedances below 5000 ohms performed in our laboratory with EKG monitoring in an awake and asleep patient.  Hyperventilation was not performed. Photic stimulation was performed.  The digital EEG was referentially recorded, reformatted, and digitally filtered in a variety of bipolar and referential montages for optimal display.    Description: The patient is awake and asleep during the recording.  During maximal wakefulness, there is a symmetric, medium voltage 9 Hz posterior dominant rhythm that attenuates with eye opening.  The record is symmetric.  During drowsiness and sleep, there is an increase in theta slowing of the background with shifting asymmetry over the bilateral temporal regions.  Vertex waves and symmetric sleep spindles were seen.  Photic stimulation did not elicit any abnormalities.  There were no epileptiform discharges or electrographic seizures seen.    EKG lead was unremarkable.  Impression: This awake and asleep EEG is within normal limits.  Clinical Correlation: A normal EEG does not exclude a clinical diagnosis of epilepsy.  If further clinical questions remain, prolonged EEG may be helpful.  Clinical correlation is advised.   Ellouise Newer, M.D.

## 2021-03-06 ENCOUNTER — Ambulatory Visit
Admission: RE | Admit: 2021-03-06 | Discharge: 2021-03-06 | Disposition: A | Discharge status: Home / Self Care | Payer: Medicare Other | Source: Ambulatory Visit | Attending: Nephrology | Admitting: Nephrology

## 2021-03-06 ENCOUNTER — Other Ambulatory Visit: Payer: Self-pay

## 2021-03-06 DIAGNOSIS — R82998 Other abnormal findings in urine: Secondary | ICD-10-CM | POA: Diagnosis not present

## 2021-03-06 DIAGNOSIS — N1831 Chronic kidney disease, stage 3a: Secondary | ICD-10-CM | POA: Diagnosis not present

## 2021-03-06 DIAGNOSIS — I10 Essential (primary) hypertension: Secondary | ICD-10-CM | POA: Insufficient documentation

## 2021-03-26 DIAGNOSIS — Z20822 Contact with and (suspected) exposure to covid-19: Secondary | ICD-10-CM | POA: Diagnosis not present

## 2021-03-29 DIAGNOSIS — N1831 Chronic kidney disease, stage 3a: Secondary | ICD-10-CM | POA: Diagnosis not present

## 2021-03-29 DIAGNOSIS — I10 Essential (primary) hypertension: Secondary | ICD-10-CM | POA: Diagnosis not present

## 2021-04-04 DIAGNOSIS — I10 Essential (primary) hypertension: Secondary | ICD-10-CM | POA: Diagnosis not present

## 2021-04-04 DIAGNOSIS — N1831 Chronic kidney disease, stage 3a: Secondary | ICD-10-CM | POA: Diagnosis not present

## 2021-04-10 ENCOUNTER — Ambulatory Visit (INDEPENDENT_AMBULATORY_CARE_PROVIDER_SITE_OTHER): Payer: Medicare Other

## 2021-04-10 ENCOUNTER — Telehealth: Payer: Self-pay | Admitting: Internal Medicine

## 2021-04-10 VITALS — BP 125/60 | HR 68 | Ht 63.0 in | Wt 138.0 lb

## 2021-04-10 DIAGNOSIS — Z0000 Encounter for general adult medical examination without abnormal findings: Secondary | ICD-10-CM

## 2021-04-10 NOTE — Patient Instructions (Addendum)
?  Tina Silva , ?Thank you for taking time to come for your Medicare Wellness Visit. I appreciate your ongoing commitment to your health goals. Please review the following plan we discussed and let me know if I can assist you in the future.  ? ?These are the goals we discussed: ? Goals   ? ?  ? Patient Stated  ?   I would like to eat a healthier diet (pt-stated)   ?   Stay active  ?Stay hydrated  ?Maintain weight 138lb ?  ? ?  ?  ?This is a list of the screening recommended for you and due dates:  ?Health Maintenance  ?Topic Date Due  ? Complete foot exam   Never done  ? Hemoglobin A1C  01/02/2018  ? Mammogram  07/21/2021  ? Eye exam for diabetics  12/03/2021  ? Urine Protein Check  02/08/2022  ? Tetanus Vaccine  02/03/2031  ? Pneumonia Vaccine  Completed  ? Flu Shot  Completed  ? DEXA scan (bone density measurement)  Completed  ? COVID-19 Vaccine  Completed  ? Zoster (Shingles) Vaccine  Completed  ? HPV Vaccine  Aged Out  ?  ?

## 2021-04-10 NOTE — Progress Notes (Signed)
Subjective:   Tina Silva is a 87 y.o. female who presents for Medicare Annual (Subsequent) preventive examination.  Review of Systems    No ROS.  Medicare Wellness Virtual Visit.  Visual/audio telehealth visit, UTA vital signs.   See social history for additional risk factors.   Cardiac Risk Factors include: advanced age (>33men, >44 women)     Objective:    Today's Vitals   04/10/21 0902  BP: 125/60  Pulse: 68  SpO2: 98%  Weight: 138 lb (62.6 kg)  Height: 5\' 3"  (1.6 m)   Body mass index is 24.45 kg/m.  Advanced Directives 04/10/2021 01/30/2021 04/07/2020 04/07/2019 04/03/2018 04/01/2017 07/28/2016  Does Patient Have a Medical Advance Directive? Yes Yes Yes Yes Yes Yes Yes  Type of Estate agent of [place];Living will Healthcare Power of [place];Living will;Out of facility DNR (pink MOST or yellow form) Healthcare Power of [place];Living will Healthcare Power of [place];Living will Healthcare Power of [place];Living will Healthcare Power of [place];Living will Healthcare Power of [place];Living will  Does patient want to make changes to medical advance directive? No - Patient declined - No - Patient declined No - Patient declined - No - Patient declined No - Patient declined  Copy of Healthcare Power of Attorney in Chart? Yes - validated most recent copy scanned in chart (See row information) - Yes - validated most recent copy scanned in chart (See row information) Yes - validated most recent copy scanned in chart (See row information) Yes - validated most recent copy scanned in chart (See row information) No - copy requested No - copy requested  Would patient like information on creating a medical advance directive? - - - - - - No - Patient declined    Current Medications (verified) Outpatient Encounter Medications as of 04/10/2021  Medication Sig   losartan (COZAAR) 25 MG tablet    aspirin EC 81 MG tablet Take 81 mg by mouth daily. Swallow whole.    Calcium Carbonate-Vit D-Min (CALTRATE PLUS PO) Take 1,200 mg by mouth daily.   Cholecalciferol (VITAMIN D3) 1000 units CAPS Take 1 capsule by mouth.   metoprolol succinate (TOPROL-XL) 25 MG 24 hr tablet TAKE 1/2 TABLET TWICE A DAY   Multiple Vitamin (MULTIVITAMIN) tablet Take 1 tablet by mouth daily.   pravastatin (PRAVACHOL) 20 MG tablet TAKE 1 TABLET BY MOUTH DAILY.   No facility-administered encounter medications on file as of 04/10/2021.    Allergies (verified) Lidocaine-epinephrine, Ciprofloxacin, Doxycycline, Flagyl [metronidazole], Iodine, and Sudafed [pseudoephedrine hcl]   History: Past Medical History:  Diagnosis Date   Hyperlipidemia    Hypertension    Hypothyroidism    Osteoporosis    Vertigo 07/28/2016   Past Surgical History:  Procedure Laterality Date   ABDOMINAL HYSTERECTOMY     APPENDECTOMY  1974   BREAST BIOPSY Left 11/04/2005   core   COLONOSCOPY WITH PROPOFOL N/A 02/12/2016   Procedure: COLONOSCOPY WITH PROPOFOL;  Surgeon: Scot Jun, MD;  Location: [facility] ENDOSCOPY;  Service: Endoscopy;  Laterality: N/A;   EYE SURGERY  june 2013   cataract with lens,  dinglein left eye    TONSILLECTOMY     TONSILLECTOMY AND ADENOIDECTOMY     Family History  Problem Relation Age of Onset   Parkinson's disease Mother    Cancer Sister        AML   Diabetes Sister    Heart disease Father    Diabetes Paternal Aunt    Cancer Maternal Grandmother    Breast  cancer Neg Hx    Social History   Socioeconomic History   Marital status: Single    Spouse name: Not on file   Number of children: Not on file   Years of education: Not on file   Highest education level: Not on file  Occupational History   Not on file  Tobacco Use   Smoking status: Never   Smokeless tobacco: Never  Vaping Use   Vaping Use: Never used  Substance and Sexual Activity   Alcohol use: No   Drug use: No   Sexual activity: Never  Other Topics Concern   Not on file  Social History Narrative    Independent at baseline   Left handed    Social Determinants of Health   Financial Resource Strain: Low Risk    Difficulty of Paying Living Expenses: Not hard at all  Food Insecurity: No Food Insecurity   Worried About Programme researcher, broadcasting/film/video in the Last Year: Never true   Ran Out of Food in the Last Year: Never true  Transportation Needs: No Transportation Needs   Lack of Transportation (Medical): No   Lack of Transportation (Non-Medical): No  Physical Activity: Sufficiently Active   Days of Exercise per Week: 5 days   Minutes of Exercise per Session: 30 min  Stress: No Stress Concern Present   Feeling of Stress : Not at all  Social Connections: Unknown   Frequency of Communication with Friends and Family: More than three times a week   Frequency of Social Gatherings with Friends and Family: More than three times a week   Attends Religious Services: More than 4 times per year   Active Member of [employer] or Organizations: Yes   Attends Engineer, structural: More than 4 times per year   Marital Status: Not on file    Tobacco Counseling Counseling given: Not Answered   Clinical Intake:  Pre-visit preparation completed: Yes        Diabetes: No  How often do you need to have someone help you when you read instructions, pamphlets, or other written materials from your doctor or pharmacy?: 1 - Never   Interpreter Needed?: No      Activities of Daily Living In your present state of health, do you have any difficulty performing the following activities: 04/10/2021  Hearing? N  Vision? N  Difficulty concentrating or making decisions? N  Walking or climbing stairs? N  Dressing or bathing? N  Doing errands, shopping? N  Preparing Food and eating ? N  Using the Toilet? N  In the past six months, have you accidently leaked urine? N  Do you have problems with loss of bowel control? N  Managing your Medications? N  Managing your Finances? N  Housekeeping or managing  your Housekeeping? N  Some recent data might be hidden    Patient Care Team: Sherlene Shams, MD as PCP - General (Internal Medicine) Van Clines, MD as Consulting Physician (Neurology)  Indicate any recent Medical Services you may have received from other than [facility] providers in the past year (date may be approximate).     Assessment:   This is a routine wellness examination for Paislyn.  Virtual Visit via Telephone Note  I connected with  Dellar Covarrubias Leiva on 04/10/21 at  9:00 AM EDT by telephone and verified that I am speaking with the correct person using two identifiers.  Persons participating in the virtual visit: patient/Nurse Health Advisor   I discussed the  concerns of performing an evaluation and management service by telephone and the availability of in person appointments. The patient expressed understanding and agreed to proceed. We continued and completed visit with audio only.  Some vital signs may be absent or patient reported.   Hearing/Vision screen Hearing Screening - Comments:: Followed by [facility] Difficulty hearing conversational tones Does not wear hearing aids Vision Screening - Comments:: Followed by [facility] Wears corrective lenses Cataract extraction, L eye only  They have regular follow up with the ophthalmologist  Dietary issues and exercise activities discussed: Current Exercise Habits: Home exercise routine, Type of exercise: treadmill, Time (Minutes): 30, Frequency (Times/Week): 5, Weekly Exercise (Minutes/Week): 150, Intensity: Mild Healthy diet Good water intake   Goals Addressed               This Visit's Progress     Patient Stated     I would like to eat a healthier diet (pt-stated)   On track     Stay active  Stay hydrated  Maintain weight 138lb       Depression Screen PHQ 2/9 Scores 04/10/2021 02/08/2021 12/12/2020 08/08/2020 04/07/2020 04/07/2019 02/17/2019  PHQ - 2 Score 0 0 0 0 0 0 0  PHQ- 9 Score - - - - - 0  0    Fall Risk Fall Risk  04/10/2021 02/08/2021 01/30/2021 12/12/2020 08/08/2020  Falls in the past year? 0 0 0 0 -  Number falls in past yr: - - 0 - -  Injury with Fall? - - 0 - -  Risk for fall due to : - No Fall Risks - No Fall Risks No Fall Risks  Follow up Falls evaluation completed Falls evaluation completed - Falls evaluation completed Falls evaluation completed   FALL RISK PREVENTION PERTAINING TO THE HOME: Home free of loose throw rugs in walkways, pet beds, electrical cords, etc? Yes  Adequate lighting in your home to reduce risk of falls? Yes   ASSISTIVE DEVICES UTILIZED TO PREVENT FALLS:  Life alert? No  Use of a cane, walker or w/c? No  Fall detecting watch worn? Yes  TIMED UP AND GO: Was the test performed? No .   Cognitive Function: Patient is alert and oriented x3.  MMSE - Mini Mental State Exam 04/01/2017 03/26/2016 03/14/2015  Orientation to time 5 5 5   Orientation to Place 5 5 5   Registration 3 3 3   Attention/ Calculation 5 5 5   Recall 3 3 3   Language- name 2 objects 2 2 2   Language- repeat 1 1 1   Language- follow 3 step command 3 3 3   Language- read & follow direction 1 1 1   Write a sentence 1 1 1   Copy design 1 1 1   Total score 30 30 30      6CIT Screen 04/10/2021 04/07/2019 04/03/2018  What Year? 0 points 0 points 0 points  What month? 0 points 0 points 0 points  What time? 0 points 0 points 0 points  Count back from 20 - - 0 points  Months in reverse 0 points - 0 points  Repeat phrase - - 0 points  Total Score - - 0   Immunizations Immunization History  Administered Date(s) Administered   Fluad Quad(high Dose 65+) 10/12/2018, 10/15/2019, 11/07/2020   Influenza Whole 10/22/2011   Influenza, High Dose Seasonal PF 10/18/2016, 10/21/2017   Influenza-Unspecified 11/05/2012, 10/11/2013, 10/06/2014, 10/15/2015   Moderna Covid-19 Vaccine Bivalent Booster 61yrs & up 11/21/2020   PFIZER(Purple Top)SARS-COV-2 Vaccination 02/02/2019, 02/23/2019, 11/01/2019,  05/03/2020   Pneumococcal Conjugate-13 10/12/2013   Pneumococcal Polysaccharide-23 07/15/2012, 01/02/2018   Tdap 12/30/2011, 02/02/2021   Zoster Recombinat (Shingrix) 10/07/2017, 12/09/2017   Zoster, Live 07/22/2011   Screening Tests Health Maintenance  Topic Date Due   FOOT EXAM  06/28/2021 (Originally 12/27/1944)   HEMOGLOBIN A1C  06/28/2021 (Originally 01/02/2018)   MAMMOGRAM  07/21/2021   OPHTHALMOLOGY EXAM  12/03/2021   TETANUS/TDAP  02/03/2031   Pneumonia Vaccine 12+ Years old  Completed   INFLUENZA VACCINE  Completed   DEXA SCAN  Completed   COVID-19 Vaccine  Completed   Zoster Vaccines- Shingrix  Completed   HPV VACCINES  Aged Out   Health Maintenance There are no preventive care reminders to display for this patient.  Lung Cancer Screening: (Low Dose CT Chest recommended if Age 68-80 years, 30 pack-year currently smoking OR have quit w/in 15years.) does not qualify.   Hepatitis C Screening: does not qualify.  Vision Screening: Recommended annual ophthalmology exams for early detection of glaucoma and other disorders of the eye.  Dental Screening: Recommended annual dental exams for proper oral hygiene  Community Resource Referral / Chronic Care Management: CRR required this visit?  No   CCM required this visit?  No      Plan:   Keep all routine maintenance appointments.   I have personally reviewed and noted the following in the patient's chart:   Medical and social history Use of alcohol, tobacco or illicit drugs  Current medications and supplements including opioid prescriptions.  Functional ability and status Nutritional status Physical activity Advanced directives List of other physicians Hospitalizations, surgeries, and ER visits in previous 12 months Vitals Screenings to include cognitive, depression, and falls Referrals and appointments  In addition, I have reviewed and discussed with patient certain preventive protocols, quality metrics, and  best practice recommendations. A written personalized care plan for preventive services as well as general preventive health recommendations were provided to patient.     Ashok Pall, LPN   [id number]

## 2021-04-10 NOTE — Telephone Encounter (Signed)
Pt would like to get a Hep B shot that the kidney doctor recommended and pt want to know if she is due for any lab work ?

## 2021-04-12 NOTE — Telephone Encounter (Signed)
Pt just had some blood work done in January. Is it okay to schedule pt for a Hep B vaccine per her Kidney doctors recommendation?  ?

## 2021-04-13 NOTE — Telephone Encounter (Signed)
Spoke with pt and scheduled her for a nurse visit for 1st hep b vaccine. ?

## 2021-04-18 ENCOUNTER — Other Ambulatory Visit: Payer: Self-pay

## 2021-04-18 ENCOUNTER — Ambulatory Visit (INDEPENDENT_AMBULATORY_CARE_PROVIDER_SITE_OTHER): Payer: Medicare Other

## 2021-04-18 DIAGNOSIS — Z23 Encounter for immunization: Secondary | ICD-10-CM

## 2021-04-18 NOTE — Progress Notes (Signed)
Pt arrived for 1st dose of Heplisav-B vaccine, given in L deltoid. Pt tolerated injection well, showed no signs of distress nor voiced any concerns.  ? ?Advised pt to head to checkout to schedule second dose of vaccine 1 month apart. Pt verbalized understanding.  ?

## 2021-05-01 DIAGNOSIS — Z20822 Contact with and (suspected) exposure to covid-19: Secondary | ICD-10-CM | POA: Diagnosis not present

## 2021-05-03 DIAGNOSIS — Z20822 Contact with and (suspected) exposure to covid-19: Secondary | ICD-10-CM | POA: Diagnosis not present

## 2021-05-08 DIAGNOSIS — N1831 Chronic kidney disease, stage 3a: Secondary | ICD-10-CM | POA: Diagnosis not present

## 2021-05-08 DIAGNOSIS — I10 Essential (primary) hypertension: Secondary | ICD-10-CM | POA: Diagnosis not present

## 2021-05-14 DIAGNOSIS — Z20822 Contact with and (suspected) exposure to covid-19: Secondary | ICD-10-CM | POA: Diagnosis not present

## 2021-05-17 DIAGNOSIS — R894 Abnormal immunological findings in specimens from other organs, systems and tissues: Secondary | ICD-10-CM | POA: Diagnosis not present

## 2021-05-17 DIAGNOSIS — I10 Essential (primary) hypertension: Secondary | ICD-10-CM | POA: Diagnosis not present

## 2021-05-17 DIAGNOSIS — R600 Localized edema: Secondary | ICD-10-CM | POA: Diagnosis not present

## 2021-05-17 DIAGNOSIS — N1831 Chronic kidney disease, stage 3a: Secondary | ICD-10-CM | POA: Diagnosis not present

## 2021-05-21 ENCOUNTER — Ambulatory Visit (INDEPENDENT_AMBULATORY_CARE_PROVIDER_SITE_OTHER): Payer: Medicare Other | Admitting: *Deleted

## 2021-05-21 DIAGNOSIS — Z23 Encounter for immunization: Secondary | ICD-10-CM

## 2021-05-21 NOTE — Progress Notes (Signed)
Patient in for second and final dose Heplisav-B patient tolerated well. ?

## 2021-05-28 ENCOUNTER — Other Ambulatory Visit: Payer: Self-pay | Admitting: Internal Medicine

## 2021-05-29 ENCOUNTER — Encounter: Payer: Self-pay | Admitting: Oncology

## 2021-05-29 ENCOUNTER — Inpatient Hospital Stay: Payer: Medicare Other | Attending: Oncology | Admitting: Oncology

## 2021-05-29 ENCOUNTER — Inpatient Hospital Stay: Payer: Medicare Other

## 2021-05-29 VITALS — BP 125/73 | HR 65 | Temp 96.4°F | Ht 63.0 in | Wt 136.0 lb

## 2021-05-29 DIAGNOSIS — N189 Chronic kidney disease, unspecified: Secondary | ICD-10-CM | POA: Diagnosis not present

## 2021-05-29 DIAGNOSIS — R778 Other specified abnormalities of plasma proteins: Secondary | ICD-10-CM | POA: Diagnosis not present

## 2021-05-29 DIAGNOSIS — Z7289 Other problems related to lifestyle: Secondary | ICD-10-CM

## 2021-05-29 DIAGNOSIS — D472 Monoclonal gammopathy: Secondary | ICD-10-CM | POA: Insufficient documentation

## 2021-05-29 DIAGNOSIS — Z90710 Acquired absence of both cervix and uterus: Secondary | ICD-10-CM | POA: Insufficient documentation

## 2021-05-29 DIAGNOSIS — I129 Hypertensive chronic kidney disease with stage 1 through stage 4 chronic kidney disease, or unspecified chronic kidney disease: Secondary | ICD-10-CM | POA: Diagnosis not present

## 2021-05-29 DIAGNOSIS — D696 Thrombocytopenia, unspecified: Secondary | ICD-10-CM | POA: Diagnosis not present

## 2021-05-29 DIAGNOSIS — Z806 Family history of leukemia: Secondary | ICD-10-CM | POA: Insufficient documentation

## 2021-05-29 DIAGNOSIS — Z809 Family history of malignant neoplasm, unspecified: Secondary | ICD-10-CM | POA: Diagnosis not present

## 2021-05-29 DIAGNOSIS — Z114 Encounter for screening for human immunodeficiency virus [HIV]: Secondary | ICD-10-CM

## 2021-05-29 LAB — HEPATITIS PANEL, ACUTE
HCV Ab: NONREACTIVE
Hep A IgM: NONREACTIVE
Hep B C IgM: NONREACTIVE
Hepatitis B Surface Ag: NONREACTIVE

## 2021-05-29 LAB — COMPREHENSIVE METABOLIC PANEL
ALT: 21 U/L (ref 0–44)
AST: 20 U/L (ref 15–41)
Albumin: 4.5 g/dL (ref 3.5–5.0)
Alkaline Phosphatase: 60 U/L (ref 38–126)
Anion gap: 4 — ABNORMAL LOW (ref 5–15)
BUN: 26 mg/dL — ABNORMAL HIGH (ref 8–23)
CO2: 31 mmol/L (ref 22–32)
Calcium: 9.5 mg/dL (ref 8.9–10.3)
Chloride: 101 mmol/L (ref 98–111)
Creatinine, Ser: 1 mg/dL (ref 0.44–1.00)
GFR, Estimated: 56 mL/min — ABNORMAL LOW (ref >60)
Glucose, Bld: 95 mg/dL (ref 70–99)
Potassium: 4.2 mmol/L (ref 3.5–5.1)
Sodium: 136 mmol/L (ref 135–145)
Total Bilirubin: 0.8 mg/dL (ref 0.3–1.2)
Total Protein: 6.8 g/dL (ref 6.5–8.1)

## 2021-05-29 LAB — CBC WITH DIFFERENTIAL/PLATELET
Abs Immature Granulocytes: 0.01 10*3/uL (ref 0.00–0.07)
Basophils Absolute: 0 10*3/uL (ref 0.0–0.1)
Basophils Relative: 0 %
Eosinophils Absolute: 0.1 10*3/uL (ref 0.0–0.5)
Eosinophils Relative: 1 %
HCT: 41.4 % (ref 36.0–46.0)
Hemoglobin: 14.1 g/dL (ref 12.0–15.0)
Immature Granulocytes: 0 %
Lymphocytes Relative: 30 %
Lymphs Abs: 1.4 10*3/uL (ref 0.7–4.0)
MCH: 34.1 pg — ABNORMAL HIGH (ref 26.0–34.0)
MCHC: 34.1 g/dL (ref 30.0–36.0)
MCV: 100.2 fL — ABNORMAL HIGH (ref 80.0–100.0)
Monocytes Absolute: 0.2 10*3/uL (ref 0.1–1.0)
Monocytes Relative: 5 %
Neutro Abs: 3 10*3/uL (ref 1.7–7.7)
Neutrophils Relative %: 64 %
Platelets: 141 10*3/uL — ABNORMAL LOW (ref 150–400)
RBC: 4.13 MIL/uL (ref 3.87–5.11)
RDW: 11.7 % (ref 11.5–15.5)
WBC: 4.7 10*3/uL (ref 4.0–10.5)
nRBC: 0 % (ref 0.0–0.2)

## 2021-05-29 LAB — VITAMIN B12: Vitamin B-12: 840 pg/mL (ref 180–914)

## 2021-05-29 LAB — IMMATURE PLATELET FRACTION: Immature Platelet Fraction: 3.5 % (ref 1.2–8.6)

## 2021-05-29 LAB — HIV ANTIBODY (ROUTINE TESTING W REFLEX): HIV Screen 4th Generation wRfx: NONREACTIVE

## 2021-05-29 LAB — FOLATE: Folate: 58 ng/mL (ref >5.9)

## 2021-05-29 NOTE — Progress Notes (Signed)
?Hematology/Oncology Consult note ?[phone] [phone] ?  ? ?   ? ? ?Patient Care Team: ?Crecencio Mc, MD as PCP - General (Internal Medicine) ?Cameron Sprang, MD as Consulting Physician (Neurology) ? ?REFERRING PROVIDER: ?Murlean Iba, MD  ?CHIEF COMPLAINTS/REASON FOR VISIT:  ?Evaluation of abnormal SPEP ? ?HISTORY OF PRESENTING ILLNESS:  ? ?Tina Silva is a  87 y.o.  female with PMH listed below was seen in consultation at the request of  Murlean Iba, MD  for evaluation of abnormal SPEP. ? ?Patient has CKD, follows up with nephrology.  ?Work up showed  ?02/21/2021, free light chain ratio 10.4  dad ? MM, m grandmother MM.  ?03/29/2021, free light chain ratio 11.12 ?Random urine protein electrophoresis showed faint albumin band detected on urine protein electrophoresis.  No abnormal Bence Jones proteinuria. ?04/05/2021, serum protein electrophoresis showed a 2 abnormal protein bands detected in the gamma globulin. ? ?She has chronic history of thrombocytopenia, recent cbc showed platelet count of 111, which is slightly lower than her baseline.  Denies any easy bruising or active bleeding events.  Denies any alcohol use ? ?Review of Systems  ?Constitutional:  Positive for fatigue. Negative for appetite change, chills and fever.  ?HENT:   Negative for hearing loss and voice change.   ?Eyes:  Negative for eye problems.  ?Respiratory:  Negative for chest tightness and cough.   ?Cardiovascular:  Negative for chest pain.  ?Gastrointestinal:  Negative for abdominal distention, abdominal pain and blood in stool.  ?Endocrine: Negative for hot flashes.  ?Genitourinary:  Negative for difficulty urinating and frequency.   ?Musculoskeletal:  Negative for arthralgias.  ?Skin:  Negative for itching and rash.  ?Neurological:  Negative for extremity weakness.  ?Hematological:  Negative for adenopathy.  ?Psychiatric/Behavioral:  Negative for confusion.   ? ?MEDICAL HISTORY:  ?Past Medical History:   ?Diagnosis Date  ? Hyperlipidemia   ? Hypertension   ? Hypothyroidism   ? Osteoporosis   ? Vertigo 07/28/2016  ? ? ?SURGICAL HISTORY: ?Past Surgical History:  ?Procedure Laterality Date  ? ABDOMINAL HYSTERECTOMY    ? APPENDECTOMY  1974  ? BREAST BIOPSY Left 11/04/2005  ? core  ? COLONOSCOPY WITH PROPOFOL N/A 02/12/2016  ? Procedure: COLONOSCOPY WITH PROPOFOL;  Surgeon: Manya Silvas, MD;  Location: [facility];  Service: Endoscopy;  Laterality: N/A;  ? EYE SURGERY  june 2013  ? cataract with lens,  dinglein left eye   ? TONSILLECTOMY    ? TONSILLECTOMY AND ADENOIDECTOMY    ? ? ?SOCIAL HISTORY: ?Social History  ? ?Socioeconomic History  ? Marital status: Single  ?  Spouse name: Not on file  ? Number of children: Not on file  ? Years of education: Not on file  ? Highest education level: Not on file  ?Occupational History  ? Not on file  ?Tobacco Use  ? Smoking status: Never  ? Smokeless tobacco: Never  ?Vaping Use  ? Vaping Use: Never used  ?Substance and Sexual Activity  ? Alcohol use: No  ? Drug use: No  ? Sexual activity: Never  ?Other Topics Concern  ? Not on file  ?Social History Narrative  ? Independent at baseline  ? Left handed   ? ?Social Determinants of Health  ? ?Financial Resource Strain: Low Risk   ? Difficulty of Paying Living Expenses: Not hard at all  ?Food Insecurity: No Food Insecurity  ? Worried About Charity fundraiser in the Last Year: Never true  ? Ran  Out of Food in the Last Year: Never true  ?Transportation Needs: No Transportation Needs  ? Lack of Transportation (Medical): No  ? Lack of Transportation (Non-Medical): No  ?Physical Activity: Sufficiently Active  ? Days of Exercise per Week: 5 days  ? Minutes of Exercise per Session: 30 min  ?Stress: No Stress Concern Present  ? Feeling of Stress : Not at all  ?Social Connections: Unknown  ? Frequency of Communication with Friends and Family: More than three times a week  ? Frequency of Social Gatherings with Friends and Family: More than  three times a week  ? Attends Religious Services: More than 4 times per year  ? Active Member of Clubs or Organizations: Yes  ? Attends Archivist Meetings: More than 4 times per year  ? Marital Status: Not on file  ?Intimate Partner Violence: Not At Risk  ? Fear of Current or Ex-Partner: No  ? Emotionally Abused: No  ? Physically Abused: No  ? Sexually Abused: No  ? ? ?FAMILY HISTORY: ?Family History  ?Problem Relation Age of Onset  ? Parkinson's disease Mother   ? Cancer Sister   ?     AML  ? Diabetes Sister   ? Heart disease Father   ? Diabetes Paternal Aunt   ? Cancer Maternal Grandmother   ? Breast cancer Neg Hx   ? ? ?ALLERGIES:  is allergic to lidocaine-epinephrine, ciprofloxacin, doxycycline, flagyl [metronidazole], iodine, and sudafed [pseudoephedrine hcl]. ? ?MEDICATIONS:  ?Current Outpatient Medications  ?Medication Sig Dispense Refill  ? aspirin EC 81 MG tablet Take 81 mg by mouth daily. Swallow whole.    ? Calcium Carbonate-Vit D-Min (CALTRATE PLUS PO) Take 1,200 mg by mouth daily.    ? Cholecalciferol (VITAMIN D3) 1000 units CAPS Take 1 capsule by mouth.    ? losartan (COZAAR) 25 MG tablet     ? metoprolol succinate (TOPROL-XL) 25 MG 24 hr tablet TAKE 1/2 TABLET TWICE A DAY 90 tablet 1  ? Multiple Vitamin (MULTIVITAMIN) tablet Take 1 tablet by mouth daily.    ? pravastatin (PRAVACHOL) 20 MG tablet TAKE 1 TABLET BY MOUTH DAILY. 90 tablet 3  ? ?No current facility-administered medications for this visit.  ? ? ? ?PHYSICAL EXAMINATION: ?ECOG PERFORMANCE STATUS: 1 - Symptomatic but completely ambulatory ?Vitals:  ? 05/29/21 1109  ?BP: 125/73  ?Pulse: 65  ?Temp: (!) 96.4 ?F (35.8 ?C)  ? ?Filed Weights  ? 05/29/21 1109  ?Weight: 136 lb (61.7 kg)  ? ? ?Physical Exam ?Constitutional:   ?   General: She is not in acute distress. ?HENT:  ?   Head: Normocephalic and atraumatic.  ?Eyes:  ?   General: No scleral icterus. ?Cardiovascular:  ?   Rate and Rhythm: Normal rate and regular rhythm.  ?   Heart  sounds: Normal heart sounds.  ?Pulmonary:  ?   Effort: Pulmonary effort is normal. No respiratory distress.  ?   Breath sounds: No wheezing.  ?Abdominal:  ?   General: Bowel sounds are normal. There is no distension.  ?   Palpations: Abdomen is soft.  ?Musculoskeletal:     ?   General: No deformity. Normal range of motion.  ?   Cervical back: Normal range of motion and neck supple.  ?Skin: ?   General: Skin is warm and dry.  ?   Findings: No erythema or rash.  ?Neurological:  ?   Mental Status: She is alert and oriented to person, place, and time. Mental status  is at baseline.  ?   Cranial Nerves: No cranial nerve deficit.  ?   Coordination: Coordination normal.  ?Psychiatric:     ?   Mood and Affect: Mood normal.  ? ? ?LABORATORY DATA:  ?I have reviewed the data as listed ?Lab Results  ?Component Value Date  ? WBC 4.7 05/29/2021  ? HGB 14.1 05/29/2021  ? HCT 41.4 05/29/2021  ? MCV 100.2 (H) 05/29/2021  ? PLT 141 (L) 05/29/2021  ? ?Recent Labs  ?  08/08/20 ?1548 09/14/20 ?1400 12/26/20 ?1500 02/08/21 ?1751 05/29/21 ?1205  ?NA 141 139 143 141 136  ?K 4.3 4.0 4.1 4.1 4.2  ?CL 104 104 104 103 101  ?CO2 32 30 34* 33* 31  ?GLUCOSE 86 99 92 87 95  ?BUN _0 26*  ?CREATININE 1.02 0.99 1.09 0.93 1.00  ?CALCIUM 9.5 9.2  9.1 9.3 9.1 9.5  ?GFRNONAA  --   --   --   --  77*  ?PROT 5.8*  --   --  6.0 6.8  ?ALBUMIN 4.2 3.8  --  4.2 4.5  ?AST 16  --   --  17 20  ?ALT 13  --   --  14 21  ?ALKPHOS 59  --   --  62 60  ?BILITOT 0.5  --   --  0.7 0.8  ? ?Iron/TIBC/Ferritin/ %Sat ?No results found for: IRON, TIBC, FERRITIN, IRONPCTSAT  ? ? ?RADIOGRAPHIC STUDIES: ?I have personally reviewed the radiological images as listed and agreed with the findings in the report. ?No results found. ? ? ? ?ASSESSMENT & PLAN:  ?1. Abnormal SPEP   ?2. Thrombocytopenia ([place])   ?3. Screening for HIV without presence of risk factors   ?4. Other problems related to lifestyle   ? ?#. Abnormal SPEP ?Check CBC, CMP, multiple myeloma panel, light chain  ratio, 24-hour urine protein electrophoresis ? ?#Chronic thrombocytopenia, check vitamin B12, folate, HIV, hepatitis panel, ANA, peripheral blood flow cytometry. ? ?Orders Placed This Encounter  ?Procedures  ? Fol

## 2021-05-30 LAB — KAPPA/LAMBDA LIGHT CHAINS
Kappa free light chain: 114.4 mg/L — ABNORMAL HIGH (ref 3.3–19.4)
Kappa, lambda light chain ratio: 10.59 — ABNORMAL HIGH (ref 0.26–1.65)
Lambda free light chains: 10.8 mg/L (ref 5.7–26.3)

## 2021-05-31 LAB — COMP PANEL: LEUKEMIA/LYMPHOMA

## 2021-05-31 LAB — ANTINUCLEAR ANTIBODIES, IFA: ANA Ab, IFA: NEGATIVE

## 2021-06-01 LAB — MULTIPLE MYELOMA PANEL, SERUM
Albumin SerPl Elph-Mcnc: 3.9 g/dL (ref 2.9–4.4)
Albumin/Glob SerPl: 1.8 — ABNORMAL HIGH (ref 0.7–1.7)
Alpha 1: 0.2 g/dL (ref 0.0–0.4)
Alpha2 Glob SerPl Elph-Mcnc: 0.6 g/dL (ref 0.4–1.0)
B-Globulin SerPl Elph-Mcnc: 0.8 g/dL (ref 0.7–1.3)
Gamma Glob SerPl Elph-Mcnc: 0.6 g/dL (ref 0.4–1.8)
Globulin, Total: 2.2 g/dL (ref 2.2–3.9)
IgA: 67 mg/dL (ref 64–422)
IgG (Immunoglobin G), Serum: 616 mg/dL (ref 586–1602)
IgM (Immunoglobulin M), Srm: 108 mg/dL (ref 26–217)
M Protein SerPl Elph-Mcnc: 0.3 g/dL — ABNORMAL HIGH
Total Protein ELP: 6.1 g/dL (ref 6.0–8.5)

## 2021-06-02 DIAGNOSIS — Z20822 Contact with and (suspected) exposure to covid-19: Secondary | ICD-10-CM | POA: Diagnosis not present

## 2021-06-04 DIAGNOSIS — Z90710 Acquired absence of both cervix and uterus: Secondary | ICD-10-CM | POA: Diagnosis not present

## 2021-06-04 DIAGNOSIS — R778 Other specified abnormalities of plasma proteins: Secondary | ICD-10-CM | POA: Diagnosis not present

## 2021-06-04 DIAGNOSIS — Z20822 Contact with and (suspected) exposure to covid-19: Secondary | ICD-10-CM | POA: Diagnosis not present

## 2021-06-04 DIAGNOSIS — I129 Hypertensive chronic kidney disease with stage 1 through stage 4 chronic kidney disease, or unspecified chronic kidney disease: Secondary | ICD-10-CM | POA: Diagnosis not present

## 2021-06-04 DIAGNOSIS — D696 Thrombocytopenia, unspecified: Secondary | ICD-10-CM | POA: Diagnosis not present

## 2021-06-04 DIAGNOSIS — Z809 Family history of malignant neoplasm, unspecified: Secondary | ICD-10-CM | POA: Diagnosis not present

## 2021-06-04 DIAGNOSIS — N189 Chronic kidney disease, unspecified: Secondary | ICD-10-CM | POA: Diagnosis not present

## 2021-06-05 ENCOUNTER — Other Ambulatory Visit: Payer: Self-pay

## 2021-06-05 DIAGNOSIS — R778 Other specified abnormalities of plasma proteins: Secondary | ICD-10-CM

## 2021-06-05 DIAGNOSIS — D696 Thrombocytopenia, unspecified: Secondary | ICD-10-CM

## 2021-06-06 LAB — IFE+PROTEIN ELECTRO, 24-HR UR
% BETA, Urine: 0 %
ALPHA 1 URINE: 0 %
Albumin, U: 0 %
Alpha 2, Urine: 0 %
GAMMA GLOBULIN URINE: 0 %
Total Protein, Urine-Ur/day: <104 mg/24 hr (ref 30–150)
Total Protein, Urine: <4 mg/dL
Total Volume: 2600

## 2021-06-21 ENCOUNTER — Inpatient Hospital Stay (HOSPITAL_BASED_OUTPATIENT_CLINIC_OR_DEPARTMENT_OTHER): Payer: Medicare Other | Admitting: Oncology

## 2021-06-21 ENCOUNTER — Encounter: Payer: Self-pay | Admitting: Oncology

## 2021-06-21 VITALS — BP 136/61 | HR 61 | Temp 96.9°F | Ht 63.0 in | Wt 137.0 lb

## 2021-06-21 DIAGNOSIS — D696 Thrombocytopenia, unspecified: Secondary | ICD-10-CM

## 2021-06-21 DIAGNOSIS — D472 Monoclonal gammopathy: Secondary | ICD-10-CM

## 2021-06-21 DIAGNOSIS — R778 Other specified abnormalities of plasma proteins: Secondary | ICD-10-CM

## 2021-06-21 DIAGNOSIS — N189 Chronic kidney disease, unspecified: Secondary | ICD-10-CM | POA: Diagnosis not present

## 2021-06-21 DIAGNOSIS — Z809 Family history of malignant neoplasm, unspecified: Secondary | ICD-10-CM | POA: Diagnosis not present

## 2021-06-21 DIAGNOSIS — I129 Hypertensive chronic kidney disease with stage 1 through stage 4 chronic kidney disease, or unspecified chronic kidney disease: Secondary | ICD-10-CM | POA: Diagnosis not present

## 2021-06-21 DIAGNOSIS — Z90710 Acquired absence of both cervix and uterus: Secondary | ICD-10-CM | POA: Diagnosis not present

## 2021-06-22 NOTE — Progress Notes (Signed)
Hematology/Oncology Progress note [phone] [phone]            Patient Care Team: Crecencio Mc, MD as PCP - General (Internal Medicine) Cameron Sprang, MD as Consulting Physician (Neurology)  REFERRING PROVIDER: Crecencio Mc, MD  CHIEF COMPLAINTS/REASON FOR VISIT:  MGUS  HISTORY OF PRESENTING ILLNESS:   Tina Silva is a  87 y.o.  female with PMH listed below was seen in consultation at the request of  Crecencio Mc, MD  for evaluation of abnormal SPEP.  Patient has CKD, follows up with nephrology.  Work up showed  02/21/2021, free light chain ratio 10.4  dad ? MM, m grandmother MM.  03/29/2021, free light chain ratio 11.12 Random urine protein electrophoresis showed faint albumin band detected on urine protein electrophoresis.  No abnormal Bence Jones proteinuria. 04/05/2021, serum protein electrophoresis showed a 2 abnormal protein bands detected in the gamma globulin.  She has chronic history of thrombocytopenia, recent cbc showed platelet count of 111, which is slightly lower than her baseline.  Denies any easy bruising or active bleeding events.  Denies any alcohol use   INTERVAL HISTORY Tina Silva is a 87 y.o. female who has above history reviewed by me today presents for follow up visit for MGUS.  Patient presents to review lab results.  No new complaints.    Review of Systems  Constitutional:  Positive for fatigue. Negative for appetite change, chills and fever.  HENT:   Negative for hearing loss and voice change.   Eyes:  Negative for eye problems.  Respiratory:  Negative for chest tightness and cough.   Cardiovascular:  Negative for chest pain.  Gastrointestinal:  Negative for abdominal distention, abdominal pain and blood in stool.  Endocrine: Negative for hot flashes.  Genitourinary:  Negative for difficulty urinating and frequency.   Musculoskeletal:  Negative for arthralgias.  Skin:  Negative for itching and rash.   Neurological:  Negative for extremity weakness.  Hematological:  Negative for adenopathy.  Psychiatric/Behavioral:  Negative for confusion.    MEDICAL HISTORY:  Past Medical History:  Diagnosis Date   Hyperlipidemia    Hypertension    Hypothyroidism    Osteoporosis    Vertigo 07/28/2016    SURGICAL HISTORY: Past Surgical History:  Procedure Laterality Date   ABDOMINAL HYSTERECTOMY     APPENDECTOMY  1974   BREAST BIOPSY Left 11/04/2005   core   COLONOSCOPY WITH PROPOFOL N/A 02/12/2016   Procedure: COLONOSCOPY WITH PROPOFOL;  Surgeon: Manya Silvas, MD;  Location: [facility];  Service: Endoscopy;  Laterality: N/A;   EYE SURGERY  june 2013   cataract with lens,  dinglein left eye    TONSILLECTOMY     TONSILLECTOMY AND ADENOIDECTOMY      SOCIAL HISTORY: Social History   Socioeconomic History   Marital status: Single    Spouse name: Not on file   Number of children: Not on file   Years of education: Not on file   Highest education level: Not on file  Occupational History   Not on file  Tobacco Use   Smoking status: Never   Smokeless tobacco: Never  Vaping Use   Vaping Use: Never used  Substance and Sexual Activity   Alcohol use: No   Drug use: No   Sexual activity: Never  Other Topics Concern   Not on file  Social History Narrative   Independent at baseline   Left handed    Social Determinants of Health  Financial Resource Strain: Low Risk    Difficulty of Paying Living Expenses: Not hard at all  Food Insecurity: No Food Insecurity   Worried About Charity fundraiser in the Last Year: Never true   Ran Out of Food in the Last Year: Never true  Transportation Needs: No Transportation Needs   Lack of Transportation (Medical): No   Lack of Transportation (Non-Medical): No  Physical Activity: Sufficiently Active   Days of Exercise per Week: 5 days   Minutes of Exercise per Session: 30 min  Stress: No Stress Concern Present   Feeling of Stress : Not at  all  Social Connections: Unknown   Frequency of Communication with Friends and Family: More than three times a week   Frequency of Social Gatherings with Friends and Family: More than three times a week   Attends Religious Services: More than 4 times per year   Active Member of [employer] or Organizations: Yes   Attends Music therapist: More than 4 times per year   Marital Status: Not on file  Intimate Partner Violence: Not At Risk   Fear of Current or Ex-Partner: No   Emotionally Abused: No   Physically Abused: No   Sexually Abused: No    FAMILY HISTORY: Family History  Problem Relation Age of Onset   Parkinson's disease Mother    Cancer Sister        AML   Diabetes Sister    Heart disease Father    Diabetes Paternal Aunt    Cancer Maternal Grandmother    Breast cancer Neg Hx     ALLERGIES:  is allergic to lidocaine-epinephrine, ciprofloxacin, doxycycline, flagyl [metronidazole], iodine, and sudafed [pseudoephedrine hcl].  MEDICATIONS:  Current Outpatient Medications  Medication Sig Dispense Refill   aspirin EC 81 MG tablet Take 81 mg by mouth daily. Swallow whole.     Calcium Carbonate-Vit D-Min (CALTRATE PLUS PO) Take 1,200 mg by mouth daily.     Cholecalciferol (VITAMIN D3) 1000 units CAPS Take 1 capsule by mouth.     losartan (COZAAR) 25 MG tablet      metoprolol succinate (TOPROL-XL) 25 MG 24 hr tablet TAKE 1/2 TABLET TWICE A DAY 90 tablet 1   Multiple Vitamin (MULTIVITAMIN) tablet Take 1 tablet by mouth daily.     pravastatin (PRAVACHOL) 20 MG tablet TAKE 1 TABLET BY MOUTH DAILY. 90 tablet 3   No current facility-administered medications for this visit.     PHYSICAL EXAMINATION: ECOG PERFORMANCE STATUS: 1 - Symptomatic but completely ambulatory Vitals:   06/21/21 1121  BP: 136/61  Pulse: 61  Temp: (!) 96.9 F (36.1 C)   Filed Weights   06/21/21 1121  Weight: 137 lb (62.1 kg)    Physical Exam Constitutional:      General: She is not in acute  distress. HENT:     Head: Normocephalic and atraumatic.  Eyes:     General: No scleral icterus. Cardiovascular:     Rate and Rhythm: Normal rate and regular rhythm.     Heart sounds: Normal heart sounds.  Pulmonary:     Effort: Pulmonary effort is normal. No respiratory distress.     Breath sounds: No wheezing.  Abdominal:     General: Bowel sounds are normal. There is no distension.     Palpations: Abdomen is soft.  Musculoskeletal:        General: No deformity. Normal range of motion.     Cervical back: Normal range of motion and neck supple.  Skin:    General: Skin is warm and dry.     Findings: No erythema or rash.  Neurological:     Mental Status: She is alert and oriented to person, place, and time. Mental status is at baseline.     Cranial Nerves: No cranial nerve deficit.     Coordination: Coordination normal.  Psychiatric:        Mood and Affect: Mood normal.    LABORATORY DATA:  I have reviewed the data as listed Lab Results  Component Value Date   WBC 4.7 05/29/2021   HGB 14.1 05/29/2021   HCT 41.4 05/29/2021   MCV 100.2 (H) 05/29/2021   PLT 141 (L) 05/29/2021   Recent Labs    08/08/20 1548 09/14/20 1400 12/26/20 1500 02/08/21 0939 05/29/21 1205  NA 141 139 143 141 136  K 4.3 4.0 4.1 4.1 4.2  CL 104 104 104 103 101  CO2 32 30 34* 33* 31  GLUCOSE 86 99 92 87 95  BUN _0 26*  CREATININE 1.02 0.99 1.09 0.93 1.00  CALCIUM 9.5 9.2  9.1 9.3 9.1 9.5  GFRNONAA  --   --   --   --  56*  PROT 5.8*  --   --  6.0 6.8  ALBUMIN 4.2 3.8  --  4.2 4.5  AST 16  --   --  17 20  ALT 13  --   --  14 21  ALKPHOS 59  --   --  62 60  BILITOT 0.5  --   --  0.7 0.8    Iron/TIBC/Ferritin/ %Sat No results found for: IRON, TIBC, FERRITIN, IRONPCTSAT    RADIOGRAPHIC STUDIES: I have personally reviewed the radiological images as listed and agreed with the findings in the report. No results found.    ASSESSMENT & PLAN:  1. MGUS (monoclonal gammopathy of  unknown significance)   2. Thrombocytopenia ([place])    #.  IgG kappa MGUS Labs reviewed and discussed with Multiple myeloma panel showed M protein of 0.3, immunofixation showed a biclonal IgG protein with kappa specificity. Light chain ratio 10.59.  24-hour urine protein electrophoresis did not detect any M spike. Recommend monitoring repeat blood work every 6 months.  #Chronic thrombocytopenia, normal vitamin B12, normal folate, negative HIV, negative hepatitis panel, negative ANA, normal immature platelet fraction.  Peripheral blood flow cytometry is negative for significant immunophenotypic abnormality.  Thrombocytopenia is very mild.  Monitor.  No orders of the defined types were placed in this encounter.   All questions were answered. The patient knows to call the clinic with any problems questions or concerns.  cc Crecencio Mc, MD    Return of visit: 6 months Earlie Server, MD, PhD [facility] Hematology Oncology 06/22/2021

## 2021-06-27 DIAGNOSIS — S61011A Laceration without foreign body of right thumb without damage to nail, initial encounter: Secondary | ICD-10-CM | POA: Diagnosis not present

## 2021-07-16 ENCOUNTER — Telehealth: Payer: Self-pay | Admitting: Internal Medicine

## 2021-07-16 ENCOUNTER — Encounter: Payer: Self-pay | Admitting: Internal Medicine

## 2021-07-16 ENCOUNTER — Ambulatory Visit (INDEPENDENT_AMBULATORY_CARE_PROVIDER_SITE_OTHER): Payer: Medicare Other | Admitting: Internal Medicine

## 2021-07-16 VITALS — BP 122/74 | HR 75 | Temp 98.1°F | Ht 63.0 in | Wt 135.8 lb

## 2021-07-16 DIAGNOSIS — E785 Hyperlipidemia, unspecified: Secondary | ICD-10-CM | POA: Diagnosis not present

## 2021-07-16 DIAGNOSIS — D7589 Other specified diseases of blood and blood-forming organs: Secondary | ICD-10-CM | POA: Diagnosis not present

## 2021-07-16 DIAGNOSIS — D696 Thrombocytopenia, unspecified: Secondary | ICD-10-CM

## 2021-07-16 DIAGNOSIS — N1831 Chronic kidney disease, stage 3a: Secondary | ICD-10-CM

## 2021-07-16 DIAGNOSIS — L03032 Cellulitis of left toe: Secondary | ICD-10-CM | POA: Insufficient documentation

## 2021-07-16 DIAGNOSIS — I700 Atherosclerosis of aorta: Secondary | ICD-10-CM

## 2021-07-16 DIAGNOSIS — R7301 Impaired fasting glucose: Secondary | ICD-10-CM

## 2021-07-16 DIAGNOSIS — R5383 Other fatigue: Secondary | ICD-10-CM

## 2021-07-16 DIAGNOSIS — D472 Monoclonal gammopathy: Secondary | ICD-10-CM

## 2021-07-16 DIAGNOSIS — G459 Transient cerebral ischemic attack, unspecified: Secondary | ICD-10-CM

## 2021-07-16 DIAGNOSIS — I10 Essential (primary) hypertension: Secondary | ICD-10-CM | POA: Diagnosis not present

## 2021-07-16 LAB — HEMOGLOBIN A1C: Hgb A1c MFr Bld: 5.1 % (ref 4.6–6.5)

## 2021-07-16 LAB — TSH: TSH: 2.59 u[IU]/mL (ref 0.35–5.50)

## 2021-07-16 MED ORDER — CEPHALEXIN 500 MG PO CAPS: 500.0000 mg | ORAL_CAPSULE | Freq: Four times a day (QID) | ORAL | 0 refills | Status: DC

## 2021-07-16 MED ORDER — ROSUVASTATIN CALCIUM 10 MG PO TABS: 10.0000 mg | ORAL_TABLET | Freq: Every day | ORAL | 0 refills | Status: DC

## 2021-07-16 NOTE — Assessment & Plan Note (Signed)
No recurrence of headache and loss of color vision, loss of balance  or other TIA symptoms

## 2021-07-16 NOTE — Assessment & Plan Note (Signed)
Referred to hematology:  Chronic, mild.   With  normal vitamin B12, normal folate, negative HIV, negative hepatitis panel, negative ANA, normal immature platelet fraction.  Peripheral blood flow cytometry is negative for significant immunophenotypic abnormality.

## 2021-07-16 NOTE — Assessment & Plan Note (Addendum)
Well controlled on current regimen of losartan and metoprolol . Renal function stable, no changes today.  Lab Results  Component Value Date   CREATININE 1.00 05/29/2021   Lab Results  Component Value Date   NA 136 05/29/2021   K 4.2 05/29/2021   CL 101 05/29/2021   CO2 31 05/29/2021

## 2021-07-16 NOTE — Telephone Encounter (Signed)
noted 

## 2021-07-16 NOTE — Assessment & Plan Note (Signed)
Reviewed recent workup by  Nephrology.  Reviewed need to avoid NSAIDS, which she is doing.  She has increased her water intake to 50-60 ounces daily

## 2021-07-16 NOTE — Assessment & Plan Note (Addendum)
Referred to Hematology for IgG kappa MGU Multiple myeloma panel showed M protein of 0.3, immunofixation showed a biclonal IgG protein with kappa specificity. Light chain ratio 10.59.  24-hour urine protein electrophoresis did not detect any M spike. Recommend monitoring repeat blood work every 6 months.

## 2021-07-16 NOTE — Assessment & Plan Note (Signed)
She is taking a low potency statin and LDL is <70.  No changes today  Lab Results  Component Value Date   CHOL 131 02/08/2021   HDL 49.90 02/08/2021   LDLCALC 55 02/08/2021   TRIG 131.0 02/08/2021   CHOLHDL 3 02/08/2021

## 2021-07-16 NOTE — Assessment & Plan Note (Signed)
  Aortic atherosclerosis :  Discussed need for high intensity therapy given documented evidence of moderate  atherosclerosis in the aorta .  She is willing to switch from pravastaitn to rosuvastatin

## 2021-07-16 NOTE — Assessment & Plan Note (Signed)
Empiric cephalexin prescribed

## 2021-07-16 NOTE — Patient Instructions (Signed)
  I am changing your cholesterol medication from pravastatin to rosuvastatin to help prevent placque rupture  You have a cuticle infection called Paronychia.  Treat with Keflex 4 times daily for one week and continue soaking  the  toe in epsom salts.     Please take a probiotic ( Align, Floraque or Culturelle), or the generic version of one of these over the counter medications, or eat a serving of Yogurt, for a minimum of 3 weeks  ANY TIME YOU ARE TREATED WITH AN ANTIBIOTIIC to prevent a serious antibiotic associated diarrhea  Called clostridium dificile colitis.  Taking a probiotic may also prevent vaginitis due to yeast infections and can be continued indefinitely if you feel that it improves your digestion or your elimination (bowels).

## 2021-07-16 NOTE — Progress Notes (Signed)
Subjective:  Patient ID: Tina Silva, female    DOB: 07/19/1934  Age: 87 y.o. MRN: [id number]  CC: The primary encounter diagnosis was Primary hypertension. Diagnoses of Hyperlipidemia with target LDL less than 100, Thrombocytopenia (HCC), Other fatigue, Impaired fasting glucose, MGUS (monoclonal gammopathy of unknown significance), TIA (transient ischemic attack), Paronychia of second toe, left, Macrocytosis without anemia, Aortic atherosclerosis ([place]), and Stage 3a chronic kidney disease (CKD) ([place]) were also pertinent to this visit.   HPI Tina Silva presents for follow up on hypertension hyperlipidemia, IPG , CKD and MGUS Chief Complaint  Patient presents with   Follow-up    6 month follow up    1) HTN:  Hypertension: patient checks blood pressure twice weekly at home.  Readings have been for the most part < 140/80 at rest . Patient is following a reduced salt diet most days and is taking medications as prescribed   2) MGUS:  reviewed recent  workup .  Found during CKD workup   3) Paronychia of left 2 toenail x 1 week not responding to soaks of [place]   4) Aortic atherosclerosis: Reviewed findings of prior CT scan today..  Patient is tolerating low potency statin therapy      Outpatient Medications Prior to Visit  Medication Sig Dispense Refill   aspirin EC 81 MG tablet Take 81 mg by mouth daily. Swallow whole.     Calcium Carbonate-Vit D-Min (CALTRATE PLUS PO) Take 1,200 mg by mouth daily.     Cholecalciferol (VITAMIN D3) 1000 units CAPS Take 1 capsule by mouth.     losartan (COZAAR) 25 MG tablet      metoprolol succinate (TOPROL-XL) 25 MG 24 hr tablet TAKE 1/2 TABLET TWICE A DAY 90 tablet 1   Multiple Vitamin (MULTIVITAMIN) tablet Take 1 tablet by mouth daily.     pravastatin (PRAVACHOL) 20 MG tablet TAKE 1 TABLET BY MOUTH DAILY. 90 tablet 3   No facility-administered medications prior to visit.    Review of Systems;  Patient denies headache, fevers, malaise,  unintentional weight loss, skin rash, eye pain, sinus congestion and sinus pain, sore throat, dysphagia,  hemoptysis , cough, dyspnea, wheezing, chest pain, palpitations, orthopnea, edema, abdominal pain, nausea, melena, diarrhea, constipation, flank pain, dysuria, hematuria, urinary  Frequency, nocturia, numbness, tingling, seizures,  Focal weakness, Loss of consciousness,  Tremor, insomnia, depression, anxiety, and suicidal ideation.      Objective:  BP 122/74 (BP Location: Left Arm, Patient Position: Sitting, Cuff Size: Normal)   Pulse 75   Temp 98.1 F (36.7 C) (Oral)   Ht _0  (1.6 m)   Wt 135 lb 12.8 oz (61.6 kg)   SpO2 99%   BMI 24.06 kg/m   BP Readings from Last 3 Encounters:  07/16/21 122/74  06/21/21 136/61  05/29/21 125/73    Wt Readings from Last 3 Encounters:  07/16/21 135 lb 12.8 oz (61.6 kg)  06/21/21 137 lb (62.1 kg)  05/29/21 136 lb (61.7 kg)    General appearance: alert, cooperative and appears stated age Ears: normal TM's and external ear canals both ears Throat: lips, mucosa, and tongue normal; teeth and gums normal Neck: no adenopathy, no carotid bruit, supple, symmetrical, trachea midline and thyroid not enlarged, symmetric, no tenderness/mass/nodules Back: symmetric, no curvature. ROM normal. No CVA tenderness. Lungs: clear to auscultation bilaterally Heart: regular rate and rhythm, S1, S2 normal, no murmur, click, rub or gallop Abdomen: soft, non-tender; bowel sounds normal; no masses,  no organomegaly Pulses: 2+ and  symmetric Skin: Skin color, texture, turgor normal. No rashes or lesions Ext:  lleft 2nd toe with swollen red cuticle.  Lymph nodes: Cervical, supraclavicular, and axillary nodes normal.  Lab Results  Component Value Date   HGBA1C 5.1 07/16/2021   HGBA1C 5.2 07/03/2017   HGBA1C 5.6 01/10/2012    Lab Results  Component Value Date   CREATININE 1.00 05/29/2021   CREATININE 0.93 02/08/2021   CREATININE 1.09 12/26/2020    Lab  Results  Component Value Date   WBC 4.7 05/29/2021   HGB 14.1 05/29/2021   HCT 41.4 05/29/2021   PLT 141 (L) 05/29/2021   GLUCOSE 95 05/29/2021   CHOL 131 02/08/2021   TRIG 131.0 02/08/2021   HDL 49.90 02/08/2021   LDLCALC 55 02/08/2021   ALT 21 05/29/2021   AST 20 05/29/2021   NA 136 05/29/2021   K 4.2 05/29/2021   CL 101 05/29/2021   CREATININE 1.00 05/29/2021   BUN 26 (H) 05/29/2021   CO2 31 05/29/2021   TSH 2.59 07/16/2021   HGBA1C 5.1 07/16/2021   MICROALBUR <0.7 02/08/2021    US RENAL  Result Date: 03/08/2021 CLINICAL DATA:  Chronic kidney disease stage 3 a. EXAM: RENAL / URINARY TRACT ULTRASOUND COMPLETE COMPARISON:  None. FINDINGS: Right Kidney: Renal measurements: 10.4 x 3.6 x 3.9 cm = volume: 76 mL. Increased echogenicity of renal parenchyma is noted suggesting medical renal disease. 8 mm simple cyst is noted in upper pole. No mass or hydronephrosis visualized. Left Kidney: Renal measurements: 10.7 x 4.4 x 3.8 cm = volume: 94 mL. Increased echogenicity of renal parenchyma is noted suggesting medical renal disease. No mass or hydronephrosis visualized. Bladder: Appears normal for degree of bladder distention. Bilateral urinary jets are noted. Other: None. IMPRESSION: Mildly increased echogenicity of renal parenchyma is noted bilaterally suggesting medical renal disease. No hydronephrosis or renal obstruction is noted. Electronically Signed   By: Marijo Conception M.D.   On: 03/08/2021 08:40    Assessment & Plan:   Problem List Items Addressed This Visit     Hypertension - Primary    Well controlled on current regimen of losartan and metoprolol . Renal function stable, no changes today.  Lab Results  Component Value Date   CREATININE 1.00 05/29/2021   Lab Results  Component Value Date   NA 136 05/29/2021   K 4.2 05/29/2021   CL 101 05/29/2021   CO2 31 05/29/2021         Relevant Medications   rosuvastatin (CRESTOR) 10 MG tablet   Hyperlipidemia with target LDL  less than 100    She is taking a low potency statin and LDL is <70.  No changes today  Lab Results  Component Value Date   CHOL 131 02/08/2021   HDL 49.90 02/08/2021   LDLCALC 55 02/08/2021   TRIG 131.0 02/08/2021   CHOLHDL 3 02/08/2021         Relevant Medications   rosuvastatin (CRESTOR) 10 MG tablet   Thrombocytopenia (HCC)    Referred to hematology:  Chronic, mild.   With  normal vitamin B12, normal folate, negative HIV, negative hepatitis panel, negative ANA, normal immature platelet fraction.  Peripheral blood flow cytometry is negative for significant immunophenotypic abnormality.       TIA (transient ischemic attack)    No recurrence of headache and loss of color vision, loss of balance  or other TIA symptoms       Relevant Medications   rosuvastatin (CRESTOR) 10 MG tablet  Macrocytosis without anemia    MGUS noted   Last vitamin B12 and Folate Lab Results  Component Value Date   VITAMINB12 840 05/29/2021   FOLATE 58.0 05/29/2021        Stage 3a chronic kidney disease (CKD) ([place])    Reviewed recent workup by  Nephrology.  Reviewed need to avoid NSAIDS, which she is doing.  She has increased her water intake to 50-60 ounces daily      MGUS (monoclonal gammopathy of unknown significance)    Referred to Hematology for IgG kappa MGU Multiple myeloma panel showed M protein of 0.3, immunofixation showed a biclonal IgG protein with kappa specificity. Light chain ratio 10.59.  24-hour urine protein electrophoresis did not detect any M spike. Recommend monitoring repeat blood work every 6 months.         Paronychia of second toe, left    Empiric cephalexin prescribed       Relevant Medications   cephALEXin (KEFLEX) 500 MG capsule   Aortic atherosclerosis (HCC)     Aortic atherosclerosis :  Discussed need for high intensity therapy given documented evidence of moderate  atherosclerosis in the aorta .  She is willing to switch from pravastaitn to rosuvastatin         Relevant Medications   rosuvastatin (CRESTOR) 10 MG tablet   Other Visit Diagnoses     Other fatigue       Relevant Orders   TSH (Completed)   Impaired fasting glucose       Relevant Orders   HgB A1c (Completed)       I spent a total of 16mnutes with this patient in a face to face visit on the date of this encounter reviewing the last office visit with me., ,  most recent workups by nephrology,  oncology, patient's diet and eating habits, home blood pressure readings ,  most recent imaging study ,   and post visit ordering of testing and therapeutics.    Follow-up: No follow-ups on file.   TCrecencio Mc MD

## 2021-07-16 NOTE — Telephone Encounter (Signed)
Tina [facility] from [facility] called needing clarification for a medication-cephALEXin

## 2021-07-16 NOTE — Assessment & Plan Note (Addendum)
MGUS noted   Last vitamin B12 and Folate Lab Results  Component Value Date   VITAMINB12 840 05/29/2021   FOLATE 58.0 05/29/2021

## 2021-08-03 DIAGNOSIS — R21 Rash and other nonspecific skin eruption: Secondary | ICD-10-CM | POA: Diagnosis not present

## 2021-08-06 ENCOUNTER — Ambulatory Visit (INDEPENDENT_AMBULATORY_CARE_PROVIDER_SITE_OTHER): Payer: Medicare Other | Admitting: Neurology

## 2021-08-06 ENCOUNTER — Encounter: Payer: Self-pay | Admitting: Neurology

## 2021-08-06 VITALS — BP 129/72 | HR 71 | Ht 63.0 in | Wt 137.4 lb

## 2021-08-06 DIAGNOSIS — I671 Cerebral aneurysm, nonruptured: Secondary | ICD-10-CM

## 2021-08-06 DIAGNOSIS — G459 Transient cerebral ischemic attack, unspecified: Secondary | ICD-10-CM

## 2021-08-06 NOTE — Progress Notes (Signed)
NEUROLOGY FOLLOW UP OFFICE NOTE  Khristin Keleher Schopf [id number] [birth year]  HISTORY OF PRESENT ILLNESS: I had the pleasure of seeing Chezney Huether in follow-up in the neurology clinic on 08/06/2021.  The patient was last seen 6 months ago for 2 episodes of imbalance with brain MRI showing no acute changes, moderate chronic microvascular disease. Carotid doppler no significant stenosis. I personally reviewed images for MRA head done 01/2021 which did not show any flow-limiting stenosis. There was note of atherosclerotic disease with mild irregularity of the left M2 MCA vessels, there was a 1-61m inferiorly projecting vascular protrusion arising from the cavernous left ICA which may reflect a small aneurysm. EEG in 01/2021 was normal.   Since her last visit, she reports doing well with no further similar episodes of imbalance or vision changes. She is on a daily baby aspirin, statin has been changed to Rosuvastatin. LDL 55 in 01/2021. BP today 129/72. She denies any headaches, dizziness, focal numbness/tingling/weakness. No falls. She feels that since these episodes, her brain is clearer, it is easier to do things. She provides additional information that her mother had Parkinson's disease and a stroke, her paternal aunt had mini-strokes.    History on Initial Assessment 01/30/2021: This is a pleasant 87year old left-handed woman with a history of hypertension, hyperlipidemia, hypothyroidism, MGUS, macrocytosis, presenting for evaluation of abnormal brain MRI. MRI brain was ordered due to 2 episodes of imbalance. The first episode occurred in 08/2020, she was seated in her living and got up, then noticed she kept bumping the wall on her left side. This lasted for a day then resolved. There were no other associated symptoms. A month later on 10/21/20, she drove to the end of her street then her head hurt real bad on the vertex for a few seconds. She continued driving then noticed that "everything was black and  white, there was no color, just like a black veil in front of me." She drove back home and as she got out of the car, she almost fell. Her balance was "really, really bad on the left," she had to hold on until she got inside. She closed her eyes, and 15 minutes later, color and balance was back. She denied feeling dizzy, no spinning/lightheaded sensation, nausea/vomiting, no diplopia, dysarthria/dysphagia, focal numbness/tingling/weakness, bowel/bladder dysfunction. No loss of awareness. She denies any olfactory/gustatory hallucinations, myoclonic jerks. Sleep is good. She had seen her eye doctor and ENT with normal exams. She denies any further head pains, no recent head injuries, infections, or medication changes. No family history of similar symptoms.   Carotid dopplers in 11/2020 no significant stenosis. She had an MRI brain without contrast on 12/30/20 which I personally reviewed, no acute changes. There was moderate chronic microvascular disease, no change from 2018 imaging.   Laboratory Data: Lab Results  Component Value Date   CHOL 131 02/08/2021   HDL 49.90 02/08/2021   LDLCALC 55 02/08/2021   TRIG 131.0 02/08/2021   CHOLHDL 3 02/08/2021   Lab Results  Component Value Date   HGBA1C 5.1 07/16/2021     PAST MEDICAL HISTORY: Past Medical History:  Diagnosis Date   Hyperlipidemia    Hypertension    Hypothyroidism    Osteoporosis    Vertigo 07/28/2016    MEDICATIONS: Current Outpatient Medications on File Prior to Visit  Medication Sig Dispense Refill   aspirin EC 81 MG tablet Take 81 mg by mouth daily. Swallow whole.     Calcium Carbonate-Vit D-Min (CALTRATE PLUS PO)  Take 1,200 mg by mouth daily.     Cholecalciferol (VITAMIN D3) 1000 units CAPS Take 1 capsule by mouth.     losartan (COZAAR) 25 MG tablet      metoprolol succinate (TOPROL-XL) 25 MG 24 hr tablet TAKE 1/2 TABLET TWICE A DAY 90 tablet 1   Multiple Vitamin (MULTIVITAMIN) tablet Take 1 tablet by mouth daily.      rosuvastatin (CRESTOR) 10 MG tablet Take 1 tablet (10 mg total) by mouth daily. 90 tablet 0   No current facility-administered medications on file prior to visit.    ALLERGIES: Allergies  Allergen Reactions   Lidocaine-Epinephrine Other (See Comments)    Chest tightness   Ciprofloxacin     Causes heart beat to skip   Doxycycline Other (See Comments)    Chest pain   Flagyl [Metronidazole]     Causes heart beat to skip   Iodine     Hot and tremmors   Lidocaine    Sudafed [Pseudoephedrine Hcl]     Felt like her body would explode.    FAMILY HISTORY: Family History  Problem Relation Age of Onset   Parkinson's disease Mother    Cancer Sister        AML   Diabetes Sister    Heart disease Father    Diabetes Paternal Aunt    Cancer Maternal Grandmother    Breast cancer Neg Hx     SOCIAL HISTORY: Social History   Socioeconomic History   Marital status: Single    Spouse name: Not on file   Number of children: Not on file   Years of education: Not on file   Highest education level: Not on file  Occupational History   Not on file  Tobacco Use   Smoking status: Never   Smokeless tobacco: Never  Vaping Use   Vaping Use: Never used  Substance and Sexual Activity   Alcohol use: No   Drug use: No   Sexual activity: Never  Other Topics Concern   Not on file  Social History Narrative   Independent at baseline   Left handed    Social Determinants of Health   Financial Resource Strain: Low Risk  (04/10/2021)   Overall Financial Resource Strain (CARDIA)    Difficulty of Paying Living Expenses: Not hard at all  Food Insecurity: No Food Insecurity (04/10/2021)   Hunger Vital Sign    Worried About Running Out of Food in the Last Year: Never true    Ran Out of Food in the Last Year: Never true  Transportation Needs: No Transportation Needs (04/10/2021)   PRAPARE - Hydrologist (Medical): No    Lack of Transportation (Non-Medical): No  Physical  Activity: Sufficiently Active (04/10/2021)   Exercise Vital Sign    Days of Exercise per Week: 5 days    Minutes of Exercise per Session: 30 min  Stress: No Stress Concern Present (04/10/2021)   [place]    Feeling of Stress : Not at all  Social Connections: Unknown (04/10/2021)   Social Connection and Isolation Panel [NHANES]    Frequency of Communication with Friends and Family: More than three times a week    Frequency of Social Gatherings with Friends and Family: More than three times a week    Attends Religious Services: More than 4 times per year    Active Member of [employer] or Organizations: Yes    Attends Archivist  Meetings: More than 4 times per year    Marital Status: Not on file  Intimate Partner Violence: Not At Risk (04/10/2021)   Humiliation, Afraid, Rape, and Kick questionnaire    Fear of Current or Ex-Partner: No    Emotionally Abused: No    Physically Abused: No    Sexually Abused: No     PHYSICAL EXAM: Vitals:   08/06/21 1126  BP: 129/72  Pulse: 71  SpO2: 98%   General: No acute distress Head:  Normocephalic/atraumatic Skin/Extremities: No rash, no edema Neurological Exam: alert and oriented to person, place, and time. No aphasia or dysarthria. Fund of knowledge is appropriate.  Recent and remote memory are intact.  Attention and concentration are normal.   Cranial nerves: Pupils equal, round. Extraocular movements intact with no nystagmus. Visual fields full.  No facial asymmetry.  Motor: Bulk and tone normal, muscle strength 5/5 throughout with no pronator drift.   Finger to nose testing intact.  Gait narrow-based and steady, able to tandem walk adequately.  Romberg negative.   IMPRESSION: This is a pleasant 87 yo LH woman with a history of hypertension, hyperlipidemia, hypothyroidism, macrocytosis, with episodes of imbalance, brain MRI no acute changes, there was moderate chronic  microvascular disease. Carotid doppler, MRA head did not show any significant flow-limiting stenosis. EEG normal. Symptoms concerning for possible posterior circulation TIA, she is now on a daily aspirin with no further similar symptoms, continue control of vascular risk factors. MRA head showed a very small 1-6m inferiorly projecting vascular protrusion arising from the cavernous left ICA, which may reflect a small aneurysm. Interval MRA head will be ordered for January 2024 to assess stability. She knows to go to the ER for any sudden change in symptoms. Follow-up in 1 year, call for any changes.    Thank you for allowing me to participate in her care.  Please do not hesitate to call for any questions or concerns.    KEllouise Newer M.D.   CC: Dr. TDerrel Nip

## 2021-08-06 NOTE — Patient Instructions (Signed)
Good to see you.  Schedule repeat MRA head without contrast for January 2024  2. Continue daily aspirin, control of blood pressure, cholesterol, sugar levels  3. Follow-up in 1 year, call for any changes

## 2021-08-13 ENCOUNTER — Ambulatory Visit
Admission: RE | Admit: 2021-08-13 | Discharge: 2021-08-13 | Disposition: A | Discharge status: Home / Self Care | Payer: Medicare Other | Source: Ambulatory Visit | Attending: Neurology | Admitting: Neurology

## 2021-08-13 DIAGNOSIS — Q283 Other malformations of cerebral vessels: Secondary | ICD-10-CM | POA: Diagnosis not present

## 2021-08-13 DIAGNOSIS — G459 Transient cerebral ischemic attack, unspecified: Secondary | ICD-10-CM

## 2021-08-13 DIAGNOSIS — Z8679 Personal history of other diseases of the circulatory system: Secondary | ICD-10-CM | POA: Diagnosis not present

## 2021-08-13 DIAGNOSIS — I671 Cerebral aneurysm, nonruptured: Secondary | ICD-10-CM

## 2021-08-22 ENCOUNTER — Other Ambulatory Visit: Payer: Self-pay | Admitting: Internal Medicine

## 2021-08-22 DIAGNOSIS — Z1231 Encounter for screening mammogram for malignant neoplasm of breast: Secondary | ICD-10-CM

## 2021-08-29 ENCOUNTER — Ambulatory Visit
Admission: RE | Admit: 2021-08-29 | Discharge: 2021-08-29 | Disposition: A | Discharge status: Home / Self Care | Payer: Medicare Other | Source: Ambulatory Visit | Attending: Internal Medicine | Admitting: Internal Medicine

## 2021-08-29 DIAGNOSIS — Z1231 Encounter for screening mammogram for malignant neoplasm of breast: Secondary | ICD-10-CM | POA: Insufficient documentation

## 2021-09-26 ENCOUNTER — Other Ambulatory Visit: Payer: Self-pay | Admitting: Internal Medicine

## 2021-10-20 DIAGNOSIS — H1031 Unspecified acute conjunctivitis, right eye: Secondary | ICD-10-CM | POA: Diagnosis not present

## 2021-10-20 DIAGNOSIS — H1131 Conjunctival hemorrhage, right eye: Secondary | ICD-10-CM | POA: Diagnosis not present

## 2021-10-23 ENCOUNTER — Ambulatory Visit (INDEPENDENT_AMBULATORY_CARE_PROVIDER_SITE_OTHER): Payer: Medicare Other

## 2021-10-23 DIAGNOSIS — Z23 Encounter for immunization: Secondary | ICD-10-CM | POA: Diagnosis not present

## 2021-11-15 ENCOUNTER — Ambulatory Visit (INDEPENDENT_AMBULATORY_CARE_PROVIDER_SITE_OTHER): Payer: Medicare Other | Admitting: Internal Medicine

## 2021-11-15 ENCOUNTER — Encounter: Payer: Self-pay | Admitting: Internal Medicine

## 2021-11-15 DIAGNOSIS — N1831 Chronic kidney disease, stage 3a: Secondary | ICD-10-CM | POA: Diagnosis not present

## 2021-11-15 DIAGNOSIS — F5102 Adjustment insomnia: Secondary | ICD-10-CM

## 2021-11-15 DIAGNOSIS — J310 Chronic rhinitis: Secondary | ICD-10-CM | POA: Diagnosis not present

## 2021-11-15 DIAGNOSIS — I10 Essential (primary) hypertension: Secondary | ICD-10-CM

## 2021-11-15 DIAGNOSIS — E785 Hyperlipidemia, unspecified: Secondary | ICD-10-CM

## 2021-11-15 MED ORDER — TRAZODONE HCL 50 MG PO TABS: 25.0000 mg | ORAL_TABLET | Freq: Every evening | ORAL | 1 refills | Status: DC | PRN
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
## 2021-11-15 NOTE — Assessment & Plan Note (Addendum)
Reassurance provided that her symptoms are not suggestive of infection or other pathology
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
## 2021-11-15 NOTE — Assessment & Plan Note (Signed)
Well controlled on current regimen of losartan and metoprolol . Renal function stable, no changes today.

## 2021-11-15 NOTE — Patient Instructions (Addendum)
  I am recommending a trial of trazodone starting at 25 mg 30 to 60 minutes before bedtime.  The dose may be advanced  every 3 or 4 days  as needed to maximum dose of 100 mg nightly    The mucus you have been having is due to "gustatory rhinitis"  .  Any treatment has more side effects than benefits!     You  can  try [employer]'s Sinus rinse ;  It is a stong sinus "flush" using water and medicated salts.  Do it  once daily either in the morning,  or in the evening before bedtime  over the sink because it can be a bit messy .  This is a good habit to get into during flu season,  and can prevent infections

## 2021-11-15 NOTE — Assessment & Plan Note (Addendum)
She is taking a high  potency statin and LDL is <70.  No changes today. LFTs are normal;  Lab Results  Component Value Date   CHOL 131 02/08/2021   HDL 49.90 02/08/2021   LDLCALC 55 02/08/2021   TRIG 131.0 02/08/2021   CHOLHDL 3 02/08/2021   Lab Results  Component Value Date   ALT 21 05/29/2021   AST 20 05/29/2021   ALKPHOS 60 05/29/2021   BILITOT 0.8 05/29/2021

## 2021-11-15 NOTE — Assessment & Plan Note (Signed)
Reviewed recent workup by  Nephrology.  Reviewed need to avoid NSAIDS, which she is doing.  She has increased her water intake to 50-60 ounces daily   Lab Results  Component Value Date   CREATININE 1.00 05/29/2021

## 2021-11-16 ENCOUNTER — Other Ambulatory Visit: Payer: Self-pay | Admitting: Internal Medicine

## 2021-11-19 DIAGNOSIS — R894 Abnormal immunological findings in specimens from other organs, systems and tissues: Secondary | ICD-10-CM | POA: Diagnosis not present

## 2021-11-19 DIAGNOSIS — I10 Essential (primary) hypertension: Secondary | ICD-10-CM | POA: Diagnosis not present

## 2021-11-19 DIAGNOSIS — R600 Localized edema: Secondary | ICD-10-CM | POA: Diagnosis not present

## 2021-11-19 DIAGNOSIS — R82998 Other abnormal findings in urine: Secondary | ICD-10-CM | POA: Diagnosis not present

## 2021-11-19 DIAGNOSIS — N1831 Chronic kidney disease, stage 3a: Secondary | ICD-10-CM | POA: Diagnosis not present

## 2021-11-20 ENCOUNTER — Telehealth: Payer: Self-pay

## 2021-11-20 NOTE — Telephone Encounter (Signed)
Access Nurse stated that they will call patient in about 30 minutes.  I relayed message to patient.

## 2021-11-20 NOTE — Telephone Encounter (Signed)
Pt has been scheduled to see Mable Paris, NP tomorrow.

## 2021-11-20 NOTE — Telephone Encounter (Signed)
Patient states this past Saturday she was dizzy when she got up, and when she tried to walk she was holding onto furniture and counters to keep herself from falling.  Patient states it was like that all day.    Patient states she took her blood pressure on Saturday, and at 7:25am it was 140/72, pulse 84, at 7:27am it was 131/72, pulse 75, at 7:28am it was 124/72, pulse 75, 7:32am 120/74, pulse 78, 7:43am 122/71, pulse 81, 9:28am 136/70, pulse 64, 5:45pm 126/70, pulse 68.  On Sunday 7:00am 124/61, pulse 71.  Sunday she wasn't dizzy, but she wasn't completely normal, she wasn't steady when walking.  Patient states if she had a sudden movement or if she turned her head quickly, she felt like she would fall.  Patient states she was about normal on Monday, but still a little off.  Patient states today she feels fine.  Patient states when she had her tia, her hair on the left side became brittle and straight.  Patient states after the episode she had this past weekend her hair was back to normal.  Patient states when she had her tia, she fell to her left.  Patient states with this episode, when she was falling, she would fall to her left also and she is wondering if there is any significance to it.  I transferred call to Access Nurse.

## 2021-11-21 ENCOUNTER — Ambulatory Visit (INDEPENDENT_AMBULATORY_CARE_PROVIDER_SITE_OTHER): Payer: Medicare Other | Admitting: Family

## 2021-11-21 ENCOUNTER — Ambulatory Visit
Admission: RE | Admit: 2021-11-21 | Discharge: 2021-11-21 | Disposition: A | Discharge status: Home / Self Care | Payer: Medicare Other | Source: Ambulatory Visit | Attending: Family | Admitting: Family

## 2021-11-21 ENCOUNTER — Encounter: Payer: Self-pay | Admitting: Family

## 2021-11-21 VITALS — BP 124/62 | HR 60 | Temp 98.1°F | Ht 63.0 in | Wt 136.0 lb

## 2021-11-21 DIAGNOSIS — R42 Dizziness and giddiness: Secondary | ICD-10-CM

## 2021-11-21 MED ORDER — GADOBUTROL 1 MMOL/ML IV SOLN
6.0000 mL | Freq: Once | INTRAVENOUS | Status: AC | PRN
  Administered 2021-11-21: 6 mL via INTRAVENOUS

## 2021-11-21 NOTE — Patient Instructions (Signed)
We are working diligently to schedule your MRI brain in a timely manner.  As discussed, if dizziness were to recur or certainly if other symptoms including vision changes, headache, confusion, facial numbness, arm numbness or weakness were to occur, please call 911 or present to nearest emergency department to be evaluated for stroke.  Transient Ischemic Attack A transient ischemic attack (TIA) happens when blood supply to the brain is blocked temporarily. A TIA causes stroke-like symptoms that go away quickly without causing any permanent damage. Having a TIA can be considered a warning sign for a stroke and should not be ignored. A person who has a TIA is at higher risk for a stroke. What are the causes? This condition is caused by a temporary blockage in an artery in the head or neck. This means the brain does not get the blood supply it needs. A blockage can be caused by: Fatty buildup in an artery in the head or neck (atherosclerosis). A blood clot traveling from the heart. An artery tear (dissection). Inflammation of an artery (vasculitis). Sometimes the cause is not known. What increases the risk? Certain factors make you more likely to develop this condition. Some of these are things you can change, including: Using products that contain nicotine or tobacco. Being inactive. Heavy alcohol use. Drug use, especially cocaine and methamphetamine. Medical conditions that may increase your risk include: High blood pressure (hypertension). High cholesterol. Diabetes. Heart disease (coronary artery disease). An irregular heartbeat, also called atrial fibrillation (AFib). Sickle cell disease. Blood clotting disorders (hypercoagulable state). Other risk factors include: Being over the age of 34. Being female. Obesity. Sleep problems such as sleep apnea. Family history of stroke. Previous history of blood clots, stroke, TIA, or heart attack. What are the signs or symptoms? Symptoms of a TIA  are the same as those of a stroke. The symptoms develop suddenly, and then go away quickly. They may include: Dizziness, loss of balance and coordination, or trouble walking. Vision changes, such as double vision, blurred vision, or loss of vision. Weakness or numbness in your face, arm, or leg, especially on one side of your body. Trouble speaking, understanding speech, or both (aphasia). Nausea and vomiting. Severe headache. Confusion. If possible, note what time your symptoms started. Tell your health care provider. How is this diagnosed? This condition may be diagnosed based on: Your symptoms and medical history. A physical exam. Imaging tests, usually a CT scan or MRI of the brain. Blood tests. You may also have other tests, including: Electrocardiogram (ECG). Echocardiogram. Continuous heart monitoring. Carotid ultrasound. A scan of blood circulation in the brain (CT angiogram or MR angiogram). How is this treated? The goal of treatment is to reduce the risk for a stroke. Stroke prevention therapies may include: Changes to diet and lifestyle, such as being physically active and stopping smoking. Treating other health conditions, such as diabetes or AFib. Medicines to thin the blood (antiplatelets or anticoagulants). Blood pressure medicines. Medicines to reduce cholesterol. If testing shows a narrowing in the arteries to your brain, your health care provider may recommend a procedure, such as: Carotid endarterectomy. This is done to remove the blockage from your artery. Carotid angioplasty and stenting. This uses a small mesh tube (stent) to open or widen an artery in the neck. The stent helps keep the artery open by supporting the artery walls. Follow these instructions at home: Medicines Take over-the-counter and prescription medicines only as told by your health care provider. If you were told to take  a medicine to thin your blood, such as aspirin or an anticoagulant, use  it exactly as told by your health care provider. Taking too much blood-thinning medicine can cause bleeding. Taking too little will not protect you against a stroke and other problems. Eating and drinking  Eat 5 or more servings of fruits and vegetables each day. Follow guidelines from your health care provider about your diet. You may need to follow a certain diet to help manage risk factors for stroke. This may include: Eating a low-fat, low-salt diet. Choosing high-fiber foods. Limiting carbohydrates and sugar. If you drink alcohol: Limit how much you have to: 0-1 drink a day for women who are not pregnant. 0-2 drinks a day for men. Know how much alcohol is in your drink. In the [place] one drink equals one 12 oz bottle of beer (355 mL), one 5 oz glass of wine (148 mL), or one 1 oz glass of hard liquor (44 mL). General instructions Maintain a healthy weight. Try to get at least 30 minutes of exercise on most days. Get treatment if you have sleep apnea. Do not use any products that contain nicotine or tobacco. These products include cigarettes, chewing tobacco, and vaping devices, such as e-cigarettes. If you need help quitting, ask your health care provider. Do not use illegal drugs. Keep all follow-up visits. Your health care provider will want to know if you have any more symptoms and to check blood labs if any medicines were prescribed. Where to find more information American Stroke Association: stroke.org Get help right away if: You have chest pain. You have fast or irregular heartbeats (palpitations). You have any symptoms of a stroke. "BE FAST" is an easy way to remember the main warning signs of a stroke. B - Balance. Signs are dizziness, sudden trouble walking, or loss of balance. E - Eyes. Signs are trouble seeing or a sudden change in vision. F - Face. Signs are sudden weakness or numbness of the face, or the face or eyelid drooping on one side. A - Arms. Signs are weakness  or numbness in an arm. This happens suddenly and usually on one side of the body. S - Speech.Signs are sudden trouble speaking, slurred speech, or trouble understanding what people say. T - Time. Time to call emergency services. Write down what time symptoms started. You have other signs of a stroke, such as: A sudden, severe headache with no known cause. Nausea or vomiting. Seizure. These symptoms may be an emergency. Get help right away. Call 911. Do not wait to see if the symptoms will go away. Do not drive yourself to the hospital. This information is not intended to replace advice given to you by your health care provider. Make sure you discuss any questions you have with your health care provider. Document Revised: 06/29/2021 Document Reviewed: 06/29/2021 Elsevier Patient Education  [place].   Stroke Prevention Some medical conditions and behaviors can lead to a higher chance of having a stroke. You can help prevent a stroke by eating healthy, exercising, not smoking, and managing any medical conditions you have. Stroke is a leading cause of functional impairment. Primary prevention is particularly important because a majority of strokes are first-time events. Stroke changes the lives of not only those who experience a stroke but also their family and other caregivers. How can this condition affect me? A stroke is a medical emergency and should be treated right away. A stroke can lead to brain damage and can sometimes  be life-threatening. If a person gets medical treatment right away, there is a better chance of surviving and recovering from a stroke. What can increase my risk? The following medical conditions may increase your risk of a stroke: Cardiovascular disease. High blood pressure (hypertension). Diabetes. High cholesterol. Sickle cell disease. Blood clotting disorders (hypercoagulable state). Obesity. Sleep disorders (obstructive sleep apnea). Other risk  factors include: Being older than age 30. Having a history of blood clots, stroke, or mini-stroke (transient ischemic attack, TIA). Genetic factors, such as race, ethnicity, or a family history of stroke. Smoking cigarettes or using other tobacco products. Taking birth control pills, especially if you also use tobacco. Heavy use of alcohol or drugs, especially cocaine and methamphetamine. Physical inactivity. What actions can I take to prevent this? Manage your health conditions High cholesterol levels. Eating a healthy diet is important for preventing high cholesterol. If cholesterol cannot be managed through diet alone, you may need to take medicines. Take any prescribed medicines to control your cholesterol as told by your health care provider. Hypertension. To reduce your risk of stroke, try to keep your blood pressure below 130/80. Eating a healthy diet and exercising regularly are important for controlling blood pressure. If these steps are not enough to manage your blood pressure, you may need to take medicines. Take any prescribed medicines to control hypertension as told by your health care provider. Ask your health care provider if you should monitor your blood pressure at home. Have your blood pressure checked every year, even if your blood pressure is normal. Blood pressure increases with age and some medical conditions. Diabetes. Eating a healthy diet and exercising regularly are important parts of managing your blood sugar (glucose). If your blood sugar cannot be managed through diet and exercise, you may need to take medicines. Take any prescribed medicines to control your diabetes as told by your health care provider. Get evaluated for obstructive sleep apnea. Talk to your health care provider about getting a sleep evaluation if you snore a lot or have excessive sleepiness. Make sure that any other medical conditions you have, such as atrial fibrillation or atherosclerosis, are  managed. Nutrition Follow instructions from your health care provider about what to eat or drink to help manage your health condition. These instructions may include: Reducing your daily calorie intake. Limiting how much salt (sodium) you use to 1,500 milligrams (mg) each day. Using only healthy fats for cooking, such as olive oil, canola oil, or sunflower oil. Eating healthy foods. You can do this by: Choosing foods that are high in fiber, such as whole grains, and fresh fruits and vegetables. Eating at least 5 servings of fruits and vegetables a day. Try to fill one-half of your plate with fruits and vegetables at each meal. Choosing lean protein foods, such as lean cuts of meat, poultry without skin, fish, tofu, beans, and nuts. Eating low-fat dairy products. Avoiding foods that are high in sodium. This can help lower blood pressure. Avoiding foods that have saturated fat, trans fat, and cholesterol. This can help prevent high cholesterol. Avoiding processed and prepared foods. Counting your daily carbohydrate intake.  Lifestyle If you drink alcohol: Limit how much you have to: 0-1 drink a day for women who are not pregnant. 0-2 drinks a day for men. Know how much alcohol is in your drink. In the [place] one drink equals one 12 oz bottle of beer (321m), one 5 oz glass of wine (1470m, or one 1 oz glass of hard liquor (445m  Do not use any products that contain nicotine or tobacco. These products include cigarettes, chewing tobacco, and vaping devices, such as e-cigarettes. If you need help quitting, ask your health care provider. Avoid secondhand smoke. Do not use drugs. Activity  Try to stay at a healthy weight. Get at least 30 minutes of exercise on most days, such as: Fast walking. Biking. Swimming. Medicines Take over-the-counter and prescription medicines only as told by your health care provider. Aspirin or blood thinners (antiplatelets or anticoagulants) may be recommended  to reduce your risk of forming blood clots that can lead to stroke. Avoid taking birth control pills. Talk to your health care provider about the risks of taking birth control pills if: You are over 17 years old. You smoke. You get very bad headaches. You have had a blood clot. Where to find more information American Stroke Association: www.strokeassociation.org Get help right away if: You or a loved one has any symptoms of a stroke. "BE FAST" is an easy way to remember the main warning signs of a stroke: B - Balance. Signs are dizziness, sudden trouble walking, or loss of balance. E - Eyes. Signs are trouble seeing or a sudden change in vision. F - Face. Signs are sudden weakness or numbness of the face, or the face or eyelid drooping on one side. A - Arms. Signs are weakness or numbness in an arm. This happens suddenly and usually on one side of the body. S - Speech. Signs are sudden trouble speaking, slurred speech, or trouble understanding what people say. T - Time. Time to call emergency services. Write down what time symptoms started. You or a loved one has other signs of a stroke, such as: A sudden, severe headache with no known cause. Nausea or vomiting. Seizure. These symptoms may represent a serious problem that is an emergency. Do not wait to see if the symptoms will go away. Get medical help right away. Call your local emergency services (911 in the [place] Do not drive yourself to the hospital. Summary You can help to prevent a stroke by eating healthy, exercising, not smoking, limiting alcohol intake, and managing any medical conditions you may have. Do not use any products that contain nicotine or tobacco. These include cigarettes, chewing tobacco, and vaping devices, such as e-cigarettes. If you need help quitting, ask your health care provider. Remember "BE FAST" for warning signs of a stroke. Get help right away if you or a loved one has any of these signs. This information  is not intended to replace advice given to you by your health care provider. Make sure you discuss any questions you have with your health care provider. Document Revised: 08/16/2019 Document Reviewed: 08/16/2019 Elsevier Patient Education  [place].

## 2021-11-21 NOTE — Assessment & Plan Note (Addendum)
Fortunately symptoms have completely resolved at this time.  Presentation more consistent with dizziness, question if related to blood pressure being on the low end of normal range.  She did experience dizziness with turning her head quickly although she did not so describe this as  vertigo.   Reassuring neurologic exam today.  She is not orthostatic.  See flow sheet.  She is compliant with antihypertensives, aspirin, statin.  LDL and blood pressure adequately controlled.  History of TIA and we opted to order a stat MRI brain.  Counseled her explicitly if dizziness were to recur or be associated with other features including vision changes, numbness of the face, confusion or headache she needs to call 911 or present to the nearest emergency room for stat imaging as scheduling MRI brain as an outpatient may take days.  We however we will diligently to get this scheduled in a timely manner.

## 2021-11-21 NOTE — Progress Notes (Signed)
Subjective:    Patient ID: Tina Silva, female    DOB: [birth year], 87 y.o.   MRN: [id number]  CC: Tina Silva is a 87 y.o. female who presents today for an acute visit.    HPI: Dizziness 'spell' 5 days ago, completely resolved   Onset of dizziness when she woke up in the morning and she sat on the side of bed. The room was not spinning. It didn't feel like vertigo.   All day she felt fine sitting however when she got up to walk she felt imbalanced.   If she turns her head quickly, ' I will feel dizzy. '  No HA, vision changes, vision loss, syncope, cp, SOB, leg swelling, palpitations, fever  Progressively dizziness got better as each day passed.   She drinks 8 eight ounce bottles of water.   Eye exam is in 2 weeks.     Hypertension-compliant losartan 25 mg, metoprolol 25 mg daily  History of TIA.  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   Currently repeating blood work every 6 months.  She is following with neurology, Dr. Delice Lesch, recently seen 08/06/2021 for TIA.  MRI brain originally ordered due to episodes of imbalance, first episode occurring 08/2020.  MRI brain 12/29/2020 no acute intracranial abnormality. Ultrasound carotid 11/2020 without significant stenosis Planning interval MRA after MRA Head 08/13/21 , MRA head planned for January 2024 to assess stability of possible small aneurysm.   LDL cholesterol 55 01/2021 Crt 0.97  HISTORY:  Past Medical History:  Diagnosis Date   Hyperlipidemia    Hypertension    Hypothyroidism    Osteoporosis    Vertigo 07/28/2016   Past Surgical History:  Procedure Laterality Date   ABDOMINAL HYSTERECTOMY     APPENDECTOMY  1974   BREAST BIOPSY Left 11/04/2005   core   COLONOSCOPY WITH PROPOFOL N/A 02/12/2016   Procedure: COLONOSCOPY WITH PROPOFOL;  Surgeon: Manya Silvas, MD;  Location: [facility];  Service:  Endoscopy;  Laterality: N/A;   EYE SURGERY  june 2013   cataract with lens,  dinglein left eye    TONSILLECTOMY     TONSILLECTOMY AND ADENOIDECTOMY     Family History  Problem Relation Age of Onset   Parkinson's disease Mother    Cancer Sister        AML   Diabetes Sister    Heart disease Father    Diabetes Paternal Aunt    Cancer Maternal Grandmother    Breast cancer Neg Hx     Allergies: Lidocaine-epinephrine, Ciprofloxacin, Doxycycline, Flagyl [metronidazole], Iodine, Lidocaine, and Sudafed [pseudoephedrine hcl] Current Outpatient Medications on File Prior to Visit  Medication Sig Dispense Refill   aspirin EC 81 MG tablet Take 81 mg by mouth daily. Swallow whole.     Calcium Carbonate-Vit D-Min (CALTRATE PLUS PO) Take 1,200 mg by mouth daily.     Cholecalciferol (VITAMIN D3) 1000 units CAPS Take 1 capsule by mouth.     losartan (COZAAR) 25 MG tablet      metoprolol succinate (TOPROL-XL) 25 MG 24 hr tablet TAKE 1/2 TABLET TWICE A DAY 90 tablet 1   Multiple Vitamin (MULTIVITAMIN) tablet Take 1 tablet by mouth daily.     rosuvastatin (CRESTOR) 10 MG tablet TAKE 1 TABLET BY MOUTH DAILY 90 tablet 3   traZODone (DESYREL) 50 MG tablet Take 0.5-1 tablets (25-50 mg total) by mouth  at bedtime as needed for sleep. 90 tablet 1   No current facility-administered medications on file prior to visit.    Social History   Tobacco Use   Smoking status: Never   Smokeless tobacco: Never  Vaping Use   Vaping Use: Never used  Substance Use Topics   Alcohol use: No   Drug use: No    Review of Systems  Constitutional:  Negative for chills, fever and unexpected weight change.  Eyes:  Negative for visual disturbance.  Respiratory:  Negative for cough.   Cardiovascular:  Negative for chest pain and palpitations.  Gastrointestinal:  Negative for nausea and vomiting.  Neurological:  Negative for dizziness (resolved) and headaches.      Objective:    BP 124/62 (BP Location: Left Arm,  Patient Position: Sitting, Cuff Size: Normal)   Pulse 60   Temp 98.1 F (36.7 C) (Oral)   Ht '5\' 3"'$  (1.6 m)   Wt 136 lb (61.7 kg)   SpO2 96%   BMI 24.09 kg/m    Physical Exam Vitals reviewed.  Constitutional:      Appearance: She is well-developed.  HENT:     Mouth/Throat:     Pharynx: Uvula midline.  Eyes:     Conjunctiva/sclera: Conjunctivae normal.     Pupils: Pupils are equal, round, and reactive to light.     Comments: Fundus normal bilaterally.   Cardiovascular:     Rate and Rhythm: Normal rate and regular rhythm.     Pulses: Normal pulses.     Heart sounds: Normal heart sounds.  Pulmonary:     Effort: Pulmonary effort is normal.     Breath sounds: Normal breath sounds. No wheezing, rhonchi or rales.  Skin:    General: Skin is warm and dry.  Neurological:     Mental Status: She is alert.     Cranial Nerves: No cranial nerve deficit.     Sensory: No sensory deficit.     Deep Tendon Reflexes:     Reflex Scores:      Bicep reflexes are 2+ on the right side and 2+ on the left side.      Patellar reflexes are 2+ on the right side and 2+ on the left side.    Comments: Grip equal and strong bilateral upper extremities. Gait strong and steady. Able to perform rapid alternating movement without difficulty.  Dix hall pike maneuver did not elicit vertigo or dizziness. No nystagmus noted.    Psychiatric:        Speech: Speech normal.        Behavior: Behavior normal.        Thought Content: Thought content normal.        Assessment & Plan:   Problem List Items Addressed This Visit       Other   Dizziness - Primary    Fortunately symptoms have completely resolved at this time.  Presentation more consistent with dizziness, question if related to blood pressure being on the low end of normal range.  She did experience dizziness with turning her head quickly although she did not so describe this as  vertigo.   Reassuring neurologic exam today.  She is not orthostatic.   See flow sheet.  She is compliant with antihypertensives, aspirin, statin.  LDL and blood pressure adequately controlled.  History of TIA and we opted to order a stat MRI brain.  Counseled her explicitly if dizziness were to recur or be associated with other features including vision  changes, numbness of the face, confusion or headache she needs to call 911 or present to the nearest emergency room for stat imaging as scheduling MRI brain as an outpatient may take days.  We however we will diligently to get this scheduled in a timely manner.      Relevant Orders   MR BRAIN W WO CONTRAST      I am having Tina Silva. Strider maintain her multivitamin, Vitamin D3, Calcium Carbonate-Vit D-Min (CALTRATE PLUS PO), aspirin EC, losartan, rosuvastatin, traZODone, and metoprolol succinate.   No orders of the defined types were placed in this encounter.   Return precautions given.   Risks, benefits, and alternatives of the medications and treatment plan prescribed today were discussed, and patient expressed understanding.   Education regarding symptom management and diagnosis given to patient on AVS.  Continue to follow with Tina Mc, MD for routine health maintenance.   Tina Silva and I agreed with plan.   Tina Paris, FNP  I have spent 32 minutes with a patient including precharting, exam, reviewing medical records, and discussion plan of care.

## 2021-11-26 DIAGNOSIS — R779 Abnormality of plasma protein, unspecified: Secondary | ICD-10-CM | POA: Diagnosis not present

## 2021-11-26 DIAGNOSIS — E782 Mixed hyperlipidemia: Secondary | ICD-10-CM | POA: Diagnosis not present

## 2021-11-26 DIAGNOSIS — N1831 Chronic kidney disease, stage 3a: Secondary | ICD-10-CM | POA: Diagnosis not present

## 2021-11-26 DIAGNOSIS — I10 Essential (primary) hypertension: Secondary | ICD-10-CM | POA: Diagnosis not present

## 2021-12-03 DIAGNOSIS — Z961 Presence of intraocular lens: Secondary | ICD-10-CM | POA: Diagnosis not present

## 2021-12-26 ENCOUNTER — Other Ambulatory Visit: Payer: Self-pay

## 2021-12-26 DIAGNOSIS — D472 Monoclonal gammopathy: Secondary | ICD-10-CM

## 2021-12-27 ENCOUNTER — Inpatient Hospital Stay: Payer: Medicare Other | Attending: Oncology

## 2021-12-27 DIAGNOSIS — D696 Thrombocytopenia, unspecified: Secondary | ICD-10-CM | POA: Diagnosis not present

## 2021-12-27 DIAGNOSIS — D472 Monoclonal gammopathy: Secondary | ICD-10-CM | POA: Diagnosis not present

## 2021-12-27 LAB — COMPREHENSIVE METABOLIC PANEL
ALT: 23 U/L (ref 0–44)
AST: 21 U/L (ref 15–41)
Albumin: 4 g/dL (ref 3.5–5.0)
Alkaline Phosphatase: 52 U/L (ref 38–126)
Anion gap: 7 (ref 5–15)
BUN: 27 mg/dL — ABNORMAL HIGH (ref 8–23)
CO2: 30 mmol/L (ref 22–32)
Calcium: 9.1 mg/dL (ref 8.9–10.3)
Chloride: 103 mmol/L (ref 98–111)
Creatinine, Ser: 0.86 mg/dL (ref 0.44–1.00)
GFR, Estimated: >60 mL/min (ref >60)
Glucose, Bld: 99 mg/dL (ref 70–99)
Potassium: 4.3 mmol/L (ref 3.5–5.1)
Sodium: 140 mmol/L (ref 135–145)
Total Bilirubin: 0.9 mg/dL (ref 0.3–1.2)
Total Protein: 6.8 g/dL (ref 6.5–8.1)

## 2021-12-27 LAB — CBC WITH DIFFERENTIAL/PLATELET
Abs Immature Granulocytes: 0.01 10*3/uL (ref 0.00–0.07)
Basophils Absolute: 0 10*3/uL (ref 0.0–0.1)
Basophils Relative: 1 %
Eosinophils Absolute: 0 10*3/uL (ref 0.0–0.5)
Eosinophils Relative: 1 %
HCT: 40.7 % (ref 36.0–46.0)
Hemoglobin: 13.6 g/dL (ref 12.0–15.0)
Immature Granulocytes: 0 %
Lymphocytes Relative: 32 %
Lymphs Abs: 1.2 10*3/uL (ref 0.7–4.0)
MCH: 33.5 pg (ref 26.0–34.0)
MCHC: 33.4 g/dL (ref 30.0–36.0)
MCV: 100.2 fL — ABNORMAL HIGH (ref 80.0–100.0)
Monocytes Absolute: 0.2 10*3/uL (ref 0.1–1.0)
Monocytes Relative: 6 %
Neutro Abs: 2.3 10*3/uL (ref 1.7–7.7)
Neutrophils Relative %: 60 %
Platelets: 108 10*3/uL — ABNORMAL LOW (ref 150–400)
RBC: 4.06 MIL/uL (ref 3.87–5.11)
RDW: 11.6 % (ref 11.5–15.5)
WBC: 3.8 10*3/uL — ABNORMAL LOW (ref 4.0–10.5)
nRBC: 0 % (ref 0.0–0.2)

## 2021-12-31 LAB — KAPPA/LAMBDA LIGHT CHAINS
Kappa free light chain: 112.3 mg/L — ABNORMAL HIGH (ref 3.3–19.4)
Kappa, lambda light chain ratio: 9.06 — ABNORMAL HIGH (ref 0.26–1.65)
Lambda free light chains: 12.4 mg/L (ref 5.7–26.3)

## 2021-12-31 LAB — MULTIPLE MYELOMA PANEL, SERUM
Albumin SerPl Elph-Mcnc: 3.6 g/dL (ref 2.9–4.4)
Albumin/Glob SerPl: 1.7 (ref 0.7–1.7)
Alpha 1: 0.2 g/dL (ref 0.0–0.4)
Alpha2 Glob SerPl Elph-Mcnc: 0.7 g/dL (ref 0.4–1.0)
B-Globulin SerPl Elph-Mcnc: 0.8 g/dL (ref 0.7–1.3)
Gamma Glob SerPl Elph-Mcnc: 0.5 g/dL (ref 0.4–1.8)
Globulin, Total: 2.2 g/dL (ref 2.2–3.9)
IgA: 51 mg/dL — ABNORMAL LOW (ref 64–422)
IgG (Immunoglobin G), Serum: 516 mg/dL — ABNORMAL LOW (ref 586–1602)
IgM (Immunoglobulin M), Srm: 93 mg/dL (ref 26–217)
M Protein SerPl Elph-Mcnc: 0.1 g/dL — ABNORMAL HIGH
Total Protein ELP: 5.8 g/dL — ABNORMAL LOW (ref 6.0–8.5)

## 2022-01-03 ENCOUNTER — Inpatient Hospital Stay: Payer: Medicare Other | Attending: Oncology | Admitting: Oncology

## 2022-01-03 ENCOUNTER — Encounter: Payer: Self-pay | Admitting: Oncology

## 2022-01-03 VITALS — BP 134/69 | HR 65 | Temp 96.0°F | Wt 138.8 lb

## 2022-01-03 DIAGNOSIS — Z90710 Acquired absence of both cervix and uterus: Secondary | ICD-10-CM | POA: Insufficient documentation

## 2022-01-03 DIAGNOSIS — D472 Monoclonal gammopathy: Secondary | ICD-10-CM | POA: Diagnosis not present

## 2022-01-03 DIAGNOSIS — D696 Thrombocytopenia, unspecified: Secondary | ICD-10-CM | POA: Insufficient documentation

## 2022-01-03 DIAGNOSIS — Z806 Family history of leukemia: Secondary | ICD-10-CM | POA: Diagnosis not present

## 2022-01-03 DIAGNOSIS — I129 Hypertensive chronic kidney disease with stage 1 through stage 4 chronic kidney disease, or unspecified chronic kidney disease: Secondary | ICD-10-CM | POA: Insufficient documentation

## 2022-01-03 DIAGNOSIS — D72819 Decreased white blood cell count, unspecified: Secondary | ICD-10-CM | POA: Diagnosis not present

## 2022-01-03 DIAGNOSIS — N189 Chronic kidney disease, unspecified: Secondary | ICD-10-CM | POA: Insufficient documentation

## 2022-01-03 NOTE — Assessment & Plan Note (Signed)
Very mildly decreased total WBC.  Repeat CBC in 4 weeks

## 2022-01-03 NOTE — Assessment & Plan Note (Signed)
Previous workup includes normal vitamin B12, normal folate, negative HIV, negative hepatitis panel, negative ANA, normal immature platelet fraction.  Peripheral blood flow cytometry is negative for significant immunophenotypic abnormality.  Labs reviewed and discussed with patient. Platelet count is slightly worse than her baseline. Recommend to repeat CBC in 4 weeks.

## 2022-01-03 NOTE — Progress Notes (Signed)
Hematology/Oncology Progress note [phone] [phone]            Patient Care Team: Crecencio Mc, MD as PCP - General (Internal Medicine) Cameron Sprang, MD as Consulting Physician (Neurology)  ASSESSMENT & PLAN:   MGUS (monoclonal gammopathy of unknown significance) #.  IgG kappa MGUS Labs reviewed and discussed with Multiple myeloma panel showed M protein of 0.1, immunofixation showed IgG monoclonal protein with kappa light chain. [Previously specificity. a biclonal IgG protein with kappa specificity] Light chain ratio 9.06  previous 24-hour urine protein electrophoresis did not detect any M spike. Recommend monitoring repeat blood work every 6 months.  Thrombocytopenia (HCC) Previous workup includes normal vitamin B12, normal folate, negative HIV, negative hepatitis panel, negative ANA, normal immature platelet fraction.  Peripheral blood flow cytometry is negative for significant immunophenotypic abnormality.  Labs reviewed and discussed with patient. Platelet count is slightly worse than her baseline. Recommend to repeat CBC in 4 weeks.  Leukopenia Very mildly decreased total WBC.  Repeat CBC in 4 weeks  Orders Placed This Encounter  Procedures   CBC with Differential/Platelet    Standing Status:   Future    Standing Expiration Date:   01/03/2023   Technologist smear review    Standing Status:   Future    Standing Expiration Date:   01/04/2023    Order Specific Question:   Clinical information:    Answer:   Multiple myeloma   Vitamin B12    Standing Status:   Future    Standing Expiration Date:   01/04/2023   Folate    Standing Status:   Future    Standing Expiration Date:   01/04/2023   Immature Platelet Fraction    Standing Status:   Future    Standing Expiration Date:   01/04/2023   CBC with Differential/Platelet    Standing Status:   Future    Standing Expiration Date:   01/03/2023   Comprehensive metabolic panel    Standing  Status:   Future    Standing Expiration Date:   01/03/2023   Technologist smear review    Standing Status:   Future    Standing Expiration Date:   01/04/2023    Order Specific Question:   Clinical information:    Answer:   multiple myeloma   Immature Platelet Fraction    Standing Status:   Future    Standing Expiration Date:   01/04/2023   Multiple Myeloma Panel (SPEP&IFE w/QIG)    Standing Status:   Future    Standing Expiration Date:   01/03/2023   Kappa/lambda light chains    Standing Status:   Future    Standing Expiration Date:   01/03/2023   Repeat CBC in 4 weeks Follow up in 6 months.  All questions were answered. The patient knows to call the clinic with any problems, questions or concerns.  Earlie Server, MD, PhD [facility] Hematology Oncology 01/03/2022   CHIEF COMPLAINTS/REASON FOR VISIT:  MGUS  HISTORY OF PRESENTING ILLNESS:   Tina Silva is a  87 y.o.  female with PMH listed below was seen in consultation at the request of  Crecencio Mc, MD  for evaluation of abnormal SPEP.  Patient has CKD, follows up with nephrology.  Work up showed  02/21/2021, free light chain ratio 10.4  dad ? MM, m grandmother MM.  03/29/2021, free light chain ratio 11.12 Random urine protein electrophoresis showed faint albumin band detected on urine protein electrophoresis.  No  abnormal Bence Jones proteinuria. 04/05/2021, serum protein electrophoresis showed a 2 abnormal protein bands detected in the gamma globulin.  She has chronic history of thrombocytopenia, recent cbc showed platelet count of 111, which is slightly lower than her baseline.  Denies any easy bruising or active bleeding events.  Denies any alcohol use   INTERVAL HISTORY Tina Silva is a 87 y.o. female who has above history reviewed by me today presents for follow up visit for MGUS.  Patient presents to review lab results.  No new complaints.  Denies any recent infections, vaccinations.   Review of Systems   Constitutional:  Positive for fatigue. Negative for appetite change, chills and fever.  HENT:   Negative for hearing loss and voice change.   Eyes:  Negative for eye problems.  Respiratory:  Negative for chest tightness and cough.   Cardiovascular:  Negative for chest pain.  Gastrointestinal:  Negative for abdominal distention, abdominal pain and blood in stool.  Endocrine: Negative for hot flashes.  Genitourinary:  Negative for difficulty urinating and frequency.   Musculoskeletal:  Negative for arthralgias.  Skin:  Negative for itching and rash.  Neurological:  Negative for extremity weakness.  Hematological:  Negative for adenopathy.  Psychiatric/Behavioral:  Negative for confusion.     MEDICAL HISTORY:  Past Medical History:  Diagnosis Date   Hyperlipidemia    Hypertension    Hypothyroidism    Osteoporosis    Vertigo 07/28/2016    SURGICAL HISTORY: Past Surgical History:  Procedure Laterality Date   ABDOMINAL HYSTERECTOMY     APPENDECTOMY  1974   BREAST BIOPSY Left 11/04/2005   core   COLONOSCOPY WITH PROPOFOL N/A 02/12/2016   Procedure: COLONOSCOPY WITH PROPOFOL;  Surgeon: Manya Silvas, MD;  Location: [facility];  Service: Endoscopy;  Laterality: N/A;   EYE SURGERY  june 2013   cataract with lens,  dinglein left eye    TONSILLECTOMY     TONSILLECTOMY AND ADENOIDECTOMY      SOCIAL HISTORY: Social History   Socioeconomic History   Marital status: Single    Spouse name: Not on file   Number of children: Not on file   Years of education: Not on file   Highest education level: Not on file  Occupational History   Not on file  Tobacco Use   Smoking status: Never   Smokeless tobacco: Never  Vaping Use   Vaping Use: Never used  Substance and Sexual Activity   Alcohol use: No   Drug use: No   Sexual activity: Never  Other Topics Concern   Not on file  Social History Narrative   Independent at baseline   Left handed    Social Determinants of Health    Financial Resource Strain: Low Risk  (04/10/2021)   Overall Financial Resource Strain (CARDIA)    Difficulty of Paying Living Expenses: Not hard at all  Food Insecurity: No Food Insecurity (04/10/2021)   Hunger Vital Sign    Worried About Running Out of Food in the Last Year: Never true    Ran Out of Food in the Last Year: Never true  Transportation Needs: No Transportation Needs (04/10/2021)   PRAPARE - Hydrologist (Medical): No    Lack of Transportation (Non-Medical): No  Physical Activity: Sufficiently Active (04/10/2021)   Exercise Vital Sign    Days of Exercise per Week: 5 days    Minutes of Exercise per Session: 30 min  Stress: No Stress Concern Present (04/10/2021)  [place] Questionnaire    Feeling of Stress : Not at all  Social Connections: Unknown (04/10/2021)   Social Connection and Isolation Panel [NHANES]    Frequency of Communication with Friends and Family: More than three times a week    Frequency of Social Gatherings with Friends and Family: More than three times a week    Attends Religious Services: More than 4 times per year    Active Member of [employer] or Organizations: Yes    Attends Music therapist: More than 4 times per year    Marital Status: Not on file  Intimate Partner Violence: Not At Risk (04/10/2021)   Humiliation, Afraid, Rape, and Kick questionnaire    Fear of Current or Ex-Partner: No    Emotionally Abused: No    Physically Abused: No    Sexually Abused: No    FAMILY HISTORY: Family History  Problem Relation Age of Onset   Parkinson's disease Mother    Cancer Sister        AML   Diabetes Sister    Heart disease Father    Diabetes Paternal Aunt    Cancer Maternal Grandmother    Breast cancer Neg Hx     ALLERGIES:  is allergic to lidocaine-epinephrine, ciprofloxacin, doxycycline, flagyl [metronidazole], iodine, lidocaine, and sudafed  [pseudoephedrine hcl].  MEDICATIONS:  Current Outpatient Medications  Medication Sig Dispense Refill   aspirin EC 81 MG tablet Take 81 mg by mouth daily. Swallow whole.     Calcium Carbonate-Vit D-Min (CALTRATE PLUS PO) Take 1,200 mg by mouth daily.     Cholecalciferol (VITAMIN D3) 1000 units CAPS Take 1 capsule by mouth.     losartan (COZAAR) 25 MG tablet      metoprolol succinate (TOPROL-XL) 25 MG 24 hr tablet TAKE 1/2 TABLET TWICE A DAY 90 tablet 1   Multiple Vitamin (MULTIVITAMIN) tablet Take 1 tablet by mouth daily.     rosuvastatin (CRESTOR) 10 MG tablet TAKE 1 TABLET BY MOUTH DAILY 90 tablet 3   rosuvastatin (CRESTOR) 10 MG tablet Take by mouth.     traZODone (DESYREL) 50 MG tablet Take 0.5-1 tablets (25-50 mg total) by mouth at bedtime as needed for sleep. 90 tablet 1   No current facility-administered medications for this visit.     PHYSICAL EXAMINATION: ECOG PERFORMANCE STATUS: 1 - Symptomatic but completely ambulatory Vitals:   01/03/22 1011  BP: 134/69  Pulse: 65  Temp: (!) 96 F (35.6 C)  SpO2: 100%   Filed Weights   01/03/22 1011  Weight: 138 lb 12.8 oz (63 kg)    Physical Exam Constitutional:      General: She is not in acute distress. HENT:     Head: Normocephalic and atraumatic.  Eyes:     General: No scleral icterus. Cardiovascular:     Rate and Rhythm: Normal rate and regular rhythm.     Heart sounds: Normal heart sounds.  Pulmonary:     Effort: Pulmonary effort is normal. No respiratory distress.     Breath sounds: No wheezing.  Abdominal:     General: Bowel sounds are normal. There is no distension.     Palpations: Abdomen is soft.  Musculoskeletal:        General: No deformity. Normal range of motion.     Cervical back: Normal range of motion and neck supple.  Skin:    General: Skin is warm and dry.     Findings: No erythema or  rash.  Neurological:     Mental Status: She is alert and oriented to person, place, and time. Mental status is  at baseline.     Cranial Nerves: No cranial nerve deficit.     Coordination: Coordination normal.  Psychiatric:        Mood and Affect: Mood normal.     LABORATORY DATA:  I have reviewed the data as listed    Latest Ref Rng & Units 12/27/2021   10:58 AM 05/29/2021   12:05 PM 09/14/2020    2:00 PM  CBC  WBC 4.0 - 10.5 K/uL 3.8  4.7  4.1   Hemoglobin 12.0 - 15.0 g/dL 13.6  14.1  12.8   Hematocrit 36.0 - 46.0 % 40.7  41.4  37.9   Platelets 150 - 400 K/uL 108  141  126.0       Latest Ref Rng & Units 12/27/2021   10:58 AM 05/29/2021   12:05 PM 02/08/2021    9:39 AM  CMP  Glucose 70 - 99 mg/dL 99  95  87   BUN 8 - 23 mg/dL _0 Creatinine 0.44 - 1.00 mg/dL 0.86  1.00  0.93   Sodium 135 - 145 mmol/L 140  136  141   Potassium 3.5 - 5.1 mmol/L 4.3  4.2  4.1   Chloride 98 - 111 mmol/L 103  101  103   CO2 22 - 32 mmol/L 30  31  33   Calcium 8.9 - 10.3 mg/dL 9.1  9.5  9.1   Total Protein 6.5 - 8.1 g/dL 6.8  6.8  6.0   Total Bilirubin 0.3 - 1.2 mg/dL 0.9  0.8  0.7   Alkaline Phos 38 - 126 U/L 52  60  62   AST 15 - 41 U/L _1 ALT 0 - 44 U/L _2 Iron/TIBC/Ferritin/ %Sat No results found for: "IRON", "TIBC", "FERRITIN", "IRONPCTSAT"    RADIOGRAPHIC STUDIES: I have personally reviewed the radiological images as listed and agreed with the findings in the report. No results found.

## 2022-01-03 NOTE — Assessment & Plan Note (Addendum)
#.  IgG kappa MGUS Labs reviewed and discussed with Multiple myeloma panel showed M protein of 0.1, immunofixation showed IgG monoclonal protein with kappa light chain. [Previously specificity. a biclonal IgG protein with kappa specificity] Light chain ratio 9.06  previous 24-hour urine protein electrophoresis did not detect any M spike. Recommend monitoring repeat blood work every 6 months.

## 2022-01-04 DIAGNOSIS — Z23 Encounter for immunization: Secondary | ICD-10-CM | POA: Diagnosis not present

## 2022-01-15 ENCOUNTER — Ambulatory Visit (INDEPENDENT_AMBULATORY_CARE_PROVIDER_SITE_OTHER): Payer: Medicare Other | Admitting: Internal Medicine

## 2022-01-15 ENCOUNTER — Encounter: Payer: Self-pay | Admitting: Internal Medicine

## 2022-01-15 VITALS — BP 124/62 | HR 78 | Temp 98.3°F | Ht 62.0 in | Wt 135.2 lb

## 2022-01-15 DIAGNOSIS — D472 Monoclonal gammopathy: Secondary | ICD-10-CM

## 2022-01-15 DIAGNOSIS — E785 Hyperlipidemia, unspecified: Secondary | ICD-10-CM

## 2022-01-15 DIAGNOSIS — I10 Essential (primary) hypertension: Secondary | ICD-10-CM | POA: Diagnosis not present

## 2022-01-15 NOTE — Patient Instructions (Addendum)
You are doing well.  Your blood pressure will fluctuate   no changes unless  your readings at home are persistently over 150  I recommend trying melatonin for your insomnia.  It is not a sedative,  But must be taken on  a regular basis to help your internal clock.  Take every evening after dinner start with 3 mg dose   Max effective dose is 6 mg  I do recommend the RSV vaccine for you, .  It is now available at your pharmacy  and will protect you against the Respiratory Syncytial Virus.  Hoping you have a wonderful and blessed Christmas season,    Dr. Derrel Nip

## 2022-01-15 NOTE — Assessment & Plan Note (Signed)
Well controlled on current regimen. Renal function stable, no changes today. 

## 2022-01-15 NOTE — Progress Notes (Signed)
Subjective:  Patient ID: Tina Silva, female    DOB: 15-Aug-1934  Age: 87 y.o. MRN: [id number]  CC: The primary encounter diagnosis was Hyperlipidemia with target LDL less than 100. Diagnoses of Primary hypertension and MGUS (monoclonal gammopathy of unknown significance) were also pertinent to this visit.   HPI Tina Silva presents for  Chief Complaint  Patient presents with   Hypertension    Hypertension: patient checks blood pressure twice weekly at home.  Readings have been for the most part > 140/80 at rest . Patient is following a reduced salt diet most days and is taking medications as prescribed (losartan 25 mg daily, metoprolol 12.5 mg twice daily .  Had a recent episode of dizziness, lasted over  day without true vertigo (but balance lost and veering to the left)   saw Joycelyn Schmid,  BP was 124/62 . Reviewed  January EEG and neurology evaluation.   2) MGUS:  seeing Dr Miachel Roux,  last visit Dec 7 , BP was 134/69 o]n Jan 03 2022 \  3) CKD:  seeing nephrology BP was 118/68 at Oct 2023 visit   4) Insomnia:  did not tolerate the trazodone due to increased wakefulness .  Has not tried melatonin .  Her cat wakes her up at 2 am ,  4 am and midnight.  She sleeps better if she waits until 9 pm to go to bed (she had been getting in bed at 6 pm!)  5) MGUS:  numbers improving ,  for repeat and follow up with [facility] in January. Energy level is good , appetite  has lost 4 lbs.  Home weights have been stable EBC and plts have dropped , repeat in January   Outpatient Medications Prior to Visit  Medication Sig Dispense Refill   aspirin EC 81 MG tablet Take 81 mg by mouth daily. Swallow whole.     Calcium Carbonate-Vit D-Min (CALTRATE PLUS PO) Take 1,200 mg by mouth daily.     Cholecalciferol (VITAMIN D3) 1000 units CAPS Take 1 capsule by mouth.     losartan (COZAAR) 25 MG tablet      metoprolol succinate (TOPROL-XL) 25 MG 24 hr tablet TAKE 1/2 TABLET TWICE A DAY 90 tablet 1   Multiple Vitamin  (MULTIVITAMIN) tablet Take 1 tablet by mouth daily.     rosuvastatin (CRESTOR) 10 MG tablet TAKE 1 TABLET BY MOUTH DAILY 90 tablet 3   traZODone (DESYREL) 50 MG tablet Take 0.5-1 tablets (25-50 mg total) by mouth at bedtime as needed for sleep. 90 tablet 1   rosuvastatin (CRESTOR) 10 MG tablet Take by mouth.     No facility-administered medications prior to visit.    Review of Systems;  Patient denies headache, fevers, malaise, unintentional weight loss, skin rash, eye pain, sinus congestion and sinus pain, sore throat, dysphagia,  hemoptysis , cough, dyspnea, wheezing, chest pain, palpitations, orthopnea, edema, abdominal pain, nausea, melena, diarrhea, constipation, flank pain, dysuria, hematuria, urinary  Frequency, nocturia, numbness, tingling, seizures,  Focal weakness, Loss of consciousness,  Tremor, insomnia, depression, anxiety, and suicidal ideation.      Objective:  BP 124/62   Pulse 78   Temp 98.3 F (36.8 C) (Oral)   Ht _0  (1.575 m)   Wt 135 lb 3.2 oz (61.3 kg)   SpO2 99%   BMI 24.73 kg/m   BP Readings from Last 3 Encounters:  01/15/22 124/62  01/03/22 134/69  11/21/21 124/62    Wt Readings from Last 3 Encounters:  01/15/22 135 lb 3.2 oz (61.3 kg)  01/03/22 138 lb 12.8 oz (63 kg)  11/21/21 136 lb (61.7 kg)    Physical Exam Vitals reviewed.  Constitutional:      General: She is not in acute distress.    Appearance: Normal appearance. She is normal weight. She is not ill-appearing, toxic-appearing or diaphoretic.  HENT:     Head: Normocephalic.  Eyes:     General: No scleral icterus.       Right eye: No discharge.        Left eye: No discharge.     Conjunctiva/sclera: Conjunctivae normal.  Cardiovascular:     Rate and Rhythm: Normal rate and regular rhythm.     Heart sounds: Normal heart sounds.  Pulmonary:     Effort: Pulmonary effort is normal. No respiratory distress.     Breath sounds: Normal breath sounds.  Musculoskeletal:        General:  Normal range of motion.  Skin:    General: Skin is warm and dry.  Neurological:     General: No focal deficit present.     Mental Status: She is alert and oriented to person, place, and time. Mental status is at baseline.  Psychiatric:        Mood and Affect: Mood normal.        Behavior: Behavior normal.        Thought Content: Thought content normal.        Judgment: Judgment normal.     Lab Results  Component Value Date   HGBA1C 5.1 07/16/2021   HGBA1C 5.2 07/03/2017   HGBA1C 5.6 01/10/2012    Lab Results  Component Value Date   CREATININE 0.86 12/27/2021   CREATININE 1.00 05/29/2021   CREATININE 0.93 02/08/2021    Lab Results  Component Value Date   WBC 3.8 (L) 12/27/2021   HGB 13.6 12/27/2021   HCT 40.7 12/27/2021   PLT 108 (L) 12/27/2021   GLUCOSE 99 12/27/2021   CHOL 131 02/08/2021   TRIG 131.0 02/08/2021   HDL 49.90 02/08/2021   LDLCALC 55 02/08/2021   ALT 23 12/27/2021   AST 21 12/27/2021   NA 140 12/27/2021   K 4.3 12/27/2021   CL 103 12/27/2021   CREATININE 0.86 12/27/2021   BUN 27 (H) 12/27/2021   CO2 30 12/27/2021   TSH 2.59 07/16/2021   HGBA1C 5.1 07/16/2021   MICROALBUR <0.7 02/08/2021    MR BRAIN W WO CONTRAST  Result Date: 11/21/2021 CLINICAL DATA:  Dizziness and imbalance, fall EXAM: MRI HEAD WITHOUT AND WITH CONTRAST TECHNIQUE: Multiplanar, multiecho pulse sequences of the brain and surrounding structures were obtained without and with intravenous contrast. CONTRAST:  17m GADAVIST GADOBUTROL 1 MMOL/ML IV SOLN COMPARISON:  12/29/2020 FINDINGS: Brain: No restricted diffusion to suggest acute or subacute infarct. No abnormal parenchymal or meningeal enhancement. No acute hemorrhage, mass, mass effect, or midline shift. No hydrocephalus or extra-axial collection. No hemosiderin deposition to suggest remote hemorrhage. Cerebral volume is within normal limits for age. T2 hyperintense signal in the periventricular white matter, likely the sequela of  mild-to-moderate chronic small vessel ischemic disease. Vascular: Normal arterial flow voids. Normal arterial and venous enhancement. Skull and upper cervical spine: Normal marrow signal. Sinuses/Orbits: Clear paranasal sinuses. Status post left lens replacement. Other: The mastoids are well aerated. IMPRESSION: No acute intracranial process. No evidence of acute or subacute infarct. No abnormal enhancement. No etiology is seen for the patient's dizziness. Electronically Signed   By: ABryson Ha  Vasan M.D.   On: 11/21/2021 20:52    Assessment & Plan:  .Hyperlipidemia with target LDL less than 100 Assessment & Plan: She is taking a high  potency statin and LDL is <70.  No changes today. LFTs are normal;  Lab Results  Component Value Date   CHOL 131 02/08/2021   HDL 49.90 02/08/2021   LDLCALC 55 02/08/2021   TRIG 131.0 02/08/2021   CHOLHDL 3 02/08/2021   Lab Results  Component Value Date   ALT 23 12/27/2021   AST 21 12/27/2021   ALKPHOS 52 12/27/2021   BILITOT 0.9 12/27/2021      Primary hypertension Assessment & Plan: Well controlled on current regimen. Renal function stable, no changes today.    MGUS (monoclonal gammopathy of unknown significance) Assessment & Plan:  IgG kappa MGUS.  Multiple myeloma panel showed M protein of 0.1, immunofixation showed IgG monoclonal protein with kappa light chain. [Previously specificity. a biclonal IgG protein with kappa specificity] Light chain ratio 9.06  previous 24-hour urine protein electrophoresis did not detect any M spike. Recommend monitoring repeat blood work every 6 months.     Follow-up: Return in about 6 months (around 07/17/2022) for hypertension.   Crecencio Mc, MD

## 2022-01-15 NOTE — Assessment & Plan Note (Signed)
IgG kappa MGUS.  Multiple myeloma panel showed M protein of 0.1, immunofixation showed IgG monoclonal protein with kappa light chain. [Previously specificity. a biclonal IgG protein with kappa specificity] Light chain ratio 9.06  previous 24-hour urine protein electrophoresis did not detect any M spike. Recommend monitoring repeat blood work every 6 months.

## 2022-01-15 NOTE — Assessment & Plan Note (Signed)
She is taking a high  potency statin and LDL is <70.  No changes today. LFTs are normal;  Lab Results  Component Value Date   CHOL 131 02/08/2021   HDL 49.90 02/08/2021   LDLCALC 55 02/08/2021   TRIG 131.0 02/08/2021   CHOLHDL 3 02/08/2021   Lab Results  Component Value Date   ALT 23 12/27/2021   AST 21 12/27/2021   ALKPHOS 52 12/27/2021   BILITOT 0.9 12/27/2021

## 2022-01-31 ENCOUNTER — Inpatient Hospital Stay: Payer: Medicare Other | Attending: Oncology

## 2022-01-31 DIAGNOSIS — D696 Thrombocytopenia, unspecified: Secondary | ICD-10-CM | POA: Diagnosis not present

## 2022-01-31 DIAGNOSIS — D472 Monoclonal gammopathy: Secondary | ICD-10-CM | POA: Insufficient documentation

## 2022-01-31 LAB — VITAMIN B12: Vitamin B-12: 827 pg/mL (ref 180–914)

## 2022-01-31 LAB — CBC WITH DIFFERENTIAL/PLATELET
Abs Immature Granulocytes: 0.01 10*3/uL (ref 0.00–0.07)
Basophils Absolute: 0 10*3/uL (ref 0.0–0.1)
Basophils Relative: 0 %
Eosinophils Absolute: 0 10*3/uL (ref 0.0–0.5)
Eosinophils Relative: 1 %
HCT: 39.3 % (ref 36.0–46.0)
Hemoglobin: 13.4 g/dL (ref 12.0–15.0)
Immature Granulocytes: 0 %
Lymphocytes Relative: 27 %
Lymphs Abs: 1.2 10*3/uL (ref 0.7–4.0)
MCH: 33.9 pg (ref 26.0–34.0)
MCHC: 34.1 g/dL (ref 30.0–36.0)
MCV: 99.5 fL (ref 80.0–100.0)
Monocytes Absolute: 0.3 10*3/uL (ref 0.1–1.0)
Monocytes Relative: 6 %
Neutro Abs: 3 10*3/uL (ref 1.7–7.7)
Neutrophils Relative %: 66 %
Platelets: 131 10*3/uL — ABNORMAL LOW (ref 150–400)
RBC: 3.95 MIL/uL (ref 3.87–5.11)
RDW: 11.7 % (ref 11.5–15.5)
WBC: 4.6 10*3/uL (ref 4.0–10.5)
nRBC: 0 % (ref 0.0–0.2)

## 2022-01-31 LAB — FOLATE: Folate: >40 ng/mL (ref >5.9)

## 2022-01-31 LAB — IMMATURE PLATELET FRACTION: Immature Platelet Fraction: 2.9 % (ref 1.2–8.6)

## 2022-04-03 ENCOUNTER — Telehealth: Payer: Self-pay | Admitting: Internal Medicine

## 2022-04-03 NOTE — Telephone Encounter (Signed)
Contacted Tina Silva to schedule their annual wellness visit. Appointment made for 04/12/2022.  Thank you,  [facility] Direct dial  [phone]

## 2022-04-12 ENCOUNTER — Ambulatory Visit (INDEPENDENT_AMBULATORY_CARE_PROVIDER_SITE_OTHER): Payer: Medicare Other

## 2022-04-12 VITALS — BP 116/64 | HR 77 | Ht 63.0 in | Wt 135.8 lb

## 2022-04-12 DIAGNOSIS — Z0000 Encounter for general adult medical examination without abnormal findings: Secondary | ICD-10-CM

## 2022-04-12 DIAGNOSIS — Z1231 Encounter for screening mammogram for malignant neoplasm of breast: Secondary | ICD-10-CM

## 2022-04-12 NOTE — Patient Instructions (Addendum)
[name] , Thank you for taking time to come for your Medicare Wellness Visit. I appreciate your ongoing commitment to your health goals. Please review the following plan we discussed and let me know if I can assist you in the future.   These are the goals we discussed:  Goals       Patient Stated     I would like to eat a healthier diet (pt-stated)      Stay active  Stay hydrated  Maintain weight 137lb        This is a list of the screening recommended for you and due dates:  Health Maintenance  Topic Date Due   Hemoglobin A1C  01/15/2022   Yearly kidney health urinalysis for diabetes  02/08/2022   COVID-19 Vaccine (6 - 2023-24 season) 04/28/2022*   Mammogram  08/30/2022   Eye exam for diabetics  12/13/2022   Yearly kidney function blood test for diabetes  12/28/2022   Medicare Annual Wellness Visit  04/12/2023   DTaP/Tdap/Td vaccine (3 - Td or Tdap) 02/03/2031   Pneumonia Vaccine  Completed   Flu Shot  Completed   DEXA scan (bone density measurement)  Completed   Zoster (Shingles) Vaccine  Completed   HPV Vaccine  Aged Out  *Topic was postponed. The date shown is not the original due date.    Mammogram- ordered per consent. Number provided for scheduling. [phone].   Advanced directives: on file  Conditions/risks identified: none new  Next appointment: Follow up in one year for your annual wellness visit    Preventive Care 65 Years and Older, Female Preventive care refers to lifestyle choices and visits with your health care provider that can promote health and wellness. What does preventive care include? A yearly physical exam. This is also called an annual well check. Dental exams once or twice a year. Routine eye exams. Ask your health care provider how often you should have your eyes checked. Personal lifestyle choices, including: Daily care of your teeth and gums. Regular physical activity. Eating a healthy diet. Avoiding tobacco and drug  use. Limiting alcohol use. Practicing safe sex. Taking low-dose aspirin every day. Taking vitamin and mineral supplements as recommended by your health care provider. What happens during an annual well check? The services and screenings done by your health care provider during your annual well check will depend on your age, overall health, lifestyle risk factors, and family history of disease. Counseling  Your health care provider may ask you questions about your: Alcohol use. Tobacco use. Drug use. Emotional well-being. Home and relationship well-being. Sexual activity. Eating habits. History of falls. Memory and ability to understand (cognition). Work and work Statistician. Reproductive health. Screening  You may have the following tests or measurements: Height, weight, and BMI. Blood pressure. Lipid and cholesterol levels. These may be checked every 5 years, or more frequently if you are over 29 years old. Skin check. Lung cancer screening. You may have this screening every year starting at age 62 if you have a 30-pack-year history of smoking and currently smoke or have quit within the past 15 years. Fecal occult blood test (FOBT) of the stool. You may have this test every year starting at age 7. Flexible sigmoidoscopy or colonoscopy. You may have a sigmoidoscopy every 5 years or a colonoscopy every 10 years starting at age 27. Hepatitis C blood test. Hepatitis B blood test. Sexually transmitted disease (STD) testing. Diabetes screening. This is done by checking your blood sugar (glucose) after  you have not eaten for a while (fasting). You may have this done every 1-3 years. Bone density scan. This is done to screen for osteoporosis. You may have this done starting at age 24. Mammogram. This may be done every 1-2 years. Talk to your health care provider about how often you should have regular mammograms. Talk with your health care provider about your test results, treatment  options, and if necessary, the need for more tests. Vaccines  Your health care provider may recommend certain vaccines, such as: Influenza vaccine. This is recommended every year. Tetanus, diphtheria, and acellular pertussis (Tdap, Td) vaccine. You may need a Td booster every 10 years. Zoster vaccine. You may need this after age 55. Pneumococcal 13-valent conjugate (PCV13) vaccine. One dose is recommended after age 50. Pneumococcal polysaccharide (PPSV23) vaccine. One dose is recommended after age 27. Talk to your health care provider about which screenings and vaccines you need and how often you need them. This information is not intended to replace advice given to you by your health care provider. Make sure you discuss any questions you have with your health care provider. Document Released: 02/10/2015 Document Revised: 10/04/2015 Document Reviewed: 11/15/2014 Elsevier Interactive Patient Education  2017 [place] Prevention in the Home Falls can cause injuries. They can happen to people of all ages. There are many things you can do to make your home safe and to help prevent falls. What can I do on the outside of my home? Regularly fix the edges of walkways and driveways and fix any cracks. Remove anything that might make you trip as you walk through a door, such as a raised step or threshold. Trim any bushes or trees on the path to your home. Use bright outdoor lighting. Clear any walking paths of anything that might make someone trip, such as rocks or tools. Regularly check to see if handrails are loose or broken. Make sure that both sides of any steps have handrails. Any raised decks and porches should have guardrails on the edges. Have any leaves, snow, or ice cleared regularly. Use sand or salt on walking paths during winter. Clean up any spills in your garage right away. This includes oil or grease spills. What can I do in the bathroom? Use night lights. Install grab  bars by the toilet and in the tub and shower. Do not use towel bars as grab bars. Use non-skid mats or decals in the tub or shower. If you need to sit down in the shower, use a plastic, non-slip stool. Keep the floor dry. Clean up any water that spills on the floor as soon as it happens. Remove soap buildup in the tub or shower regularly. Attach bath mats securely with double-sided non-slip rug tape. Do not have throw rugs and other things on the floor that can make you trip. What can I do in the bedroom? Use night lights. Make sure that you have a light by your bed that is easy to reach. Do not use any sheets or blankets that are too big for your bed. They should not hang down onto the floor. Have a firm chair that has side arms. You can use this for support while you get dressed. Do not have throw rugs and other things on the floor that can make you trip. What can I do in the kitchen? Clean up any spills right away. Avoid walking on wet floors. Keep items that you use a lot in easy-to-reach places. If you  need to reach something above you, use a strong step stool that has a grab bar. Keep electrical cords out of the way. Do not use floor polish or wax that makes floors slippery. If you must use wax, use non-skid floor wax. Do not have throw rugs and other things on the floor that can make you trip. What can I do with my stairs? Do not leave any items on the stairs. Make sure that there are handrails on both sides of the stairs and use them. Fix handrails that are broken or loose. Make sure that handrails are as long as the stairways. Check any carpeting to make sure that it is firmly attached to the stairs. Fix any carpet that is loose or worn. Avoid having throw rugs at the top or bottom of the stairs. If you do have throw rugs, attach them to the floor with carpet tape. Make sure that you have a light switch at the top of the stairs and the bottom of the stairs. If you do not have them,  ask someone to add them for you. What else can I do to help prevent falls? Wear shoes that: Do not have high heels. Have rubber bottoms. Are comfortable and fit you well. Are closed at the toe. Do not wear sandals. If you use a stepladder: Make sure that it is fully opened. Do not climb a closed stepladder. Make sure that both sides of the stepladder are locked into place. Ask someone to hold it for you, if possible. Clearly mark and make sure that you can see: Any grab bars or handrails. First and last steps. Where the edge of each step is. Use tools that help you move around (mobility aids) if they are needed. These include: Canes. Walkers. Scooters. Crutches. Turn on the lights when you go into a dark area. Replace any light bulbs as soon as they burn out. Set up your furniture so you have a clear path. Avoid moving your furniture around. If any of your floors are uneven, fix them. If there are any pets around you, be aware of where they are. Review your medicines with your doctor. Some medicines can make you feel dizzy. This can increase your chance of falling. Ask your doctor what other things that you can do to help prevent falls. This information is not intended to replace advice given to you by your health care provider. Make sure you discuss any questions you have with your health care provider. Document Released: 11/10/2008 Document Revised: 06/22/2015 Document Reviewed: 02/18/2014 Elsevier Interactive Patient Education  2017 [employer].

## 2022-04-12 NOTE — Progress Notes (Addendum)
Subjective:   Tina Silva is a 88 y.o. female who presents for Medicare Annual (Subsequent) preventive examination.  Review of Systems    No ROS.  Medicare Wellness Virtual Visit.  Visual/audio telehealth visit, UTA vital signs.   See social history for additional risk factors.   Cardiac Risk Factors include: advanced age (>20men, >25 women);hypertension     Objective:    Today's Vitals   04/12/22 0855  BP: 116/64  Pulse: 77  Weight: 135 lb 12.8 oz (61.6 kg)  Height: 5\' 3"  (1.6 m)   Body mass index is 24.06 kg/m.     04/12/2022    9:05 AM 01/03/2022   10:17 AM 08/06/2021   11:30 AM 06/21/2021   11:19 AM 05/29/2021   11:05 AM 04/10/2021    9:09 AM 01/30/2021   10:18 AM  Advanced Directives  Does Patient Have a Medical Advance Directive? Yes Yes Yes Yes Yes Yes Yes  Type of Estate agent of [place];Living will Healthcare Power of [place];Living will Healthcare Power of [place];Living will;Out of facility DNR (pink MOST or yellow form)   Healthcare Power of [place];Living will Healthcare Power of [place];Living will;Out of facility DNR (pink MOST or yellow form)  Does patient want to make changes to medical advance directive? No - Patient declined     No - Patient declined   Copy of Healthcare Power of Attorney in Chart? Yes - validated most recent copy scanned in chart (See row information) No - copy requested    Yes - validated most recent copy scanned in chart (See row information)     Current Medications (verified) Outpatient Encounter Medications as of 04/12/2022  Medication Sig   aspirin EC 81 MG tablet Take 81 mg by mouth daily. Swallow whole.   Calcium Carbonate-Vit D-Min (CALTRATE PLUS PO) Take 1,200 mg by mouth daily.   Cholecalciferol (VITAMIN D3) 1000 units CAPS Take 1 capsule by mouth.   losartan (COZAAR) 25 MG tablet    metoprolol succinate (TOPROL-XL) 25 MG 24 hr tablet TAKE 1/2 TABLET TWICE A DAY   Multiple Vitamin (MULTIVITAMIN)  tablet Take 1 tablet by mouth daily.   rosuvastatin (CRESTOR) 10 MG tablet TAKE 1 TABLET BY MOUTH DAILY   traZODone (DESYREL) 50 MG tablet Take 0.5-1 tablets (25-50 mg total) by mouth at bedtime as needed for sleep.   No facility-administered encounter medications on file as of 04/12/2022.    Allergies (verified) Lidocaine-epinephrine, Ciprofloxacin, Doxycycline, Flagyl [metronidazole], Iodine, Lidocaine, and Sudafed [pseudoephedrine hcl]   History: Past Medical History:  Diagnosis Date   Hyperlipidemia    Hypertension    Hypothyroidism    Osteoporosis    Vertigo 07/28/2016   Past Surgical History:  Procedure Laterality Date   ABDOMINAL HYSTERECTOMY     APPENDECTOMY  1974   BREAST BIOPSY Left 11/04/2005   core   COLONOSCOPY WITH PROPOFOL N/A 02/12/2016   Procedure: COLONOSCOPY WITH PROPOFOL;  Surgeon: Scot Jun, MD;  Location: [facility] ENDOSCOPY;  Service: Endoscopy;  Laterality: N/A;   EYE SURGERY  june 2013   cataract with lens,  dinglein left eye    TONSILLECTOMY     TONSILLECTOMY AND ADENOIDECTOMY     Family History  Problem Relation Age of Onset   Parkinson's disease Mother    Cancer Sister        AML   Diabetes Sister    Heart disease Father    Diabetes Paternal Aunt    Cancer Maternal Grandmother    Breast  cancer Neg Hx    Social History   Socioeconomic History   Marital status: Single    Spouse name: Not on file   Number of children: Not on file   Years of education: Not on file   Highest education level: Not on file  Occupational History   Not on file  Tobacco Use   Smoking status: Never   Smokeless tobacco: Never  Vaping Use   Vaping Use: Never used  Substance and Sexual Activity   Alcohol use: No   Drug use: No   Sexual activity: Never  Other Topics Concern   Not on file  Social History Narrative   Independent at baseline   Left handed    Social Determinants of Health   Financial Resource Strain: Low Risk  (04/08/2022)   Overall Financial  Resource Strain (CARDIA)    Difficulty of Paying Living Expenses: Not hard at all  Food Insecurity: Unknown (04/08/2022)   Hunger Vital Sign    Worried About Running Out of Food in the Last Year: Not on file    Ran Out of Food in the Last Year: Never true  Transportation Needs: No Transportation Needs (04/08/2022)   PRAPARE - Administrator, Civil Service (Medical): No    Lack of Transportation (Non-Medical): No  Physical Activity: Sufficiently Active (04/08/2022)   Exercise Vital Sign    Days of Exercise per Week: 5 days    Minutes of Exercise per Session: 30 min  Stress: No Stress Concern Present (04/08/2022)   [employer] of Occupational Health - Occupational Stress Questionnaire    Feeling of Stress : Only a little  Social Connections: Unknown (04/08/2022)   Social Connection and Isolation Panel [NHANES]    Frequency of Communication with Friends and Family: More than three times a week    Frequency of Social Gatherings with Friends and Family: Three times a week    Attends Religious Services: Not on file    Active Member of Clubs or Organizations: Yes    Attends Banker Meetings: More than 4 times per year    Marital Status: Never married    Tobacco Counseling Counseling given: Not Answered   Clinical Intake:  Pre-visit preparation completed: Yes        Diabetes: No  How often do you need to have someone help you when you read instructions, pamphlets, or other written materials from your doctor or pharmacy?: 1 - Never    Interpreter Needed?: No      Activities of Daily Living    04/08/2022    1:45 PM  In your present state of health, do you have any difficulty performing the following activities:  Hearing? 1  Vision? 0  Difficulty concentrating or making decisions? 0  Walking or climbing stairs? 0  Dressing or bathing? 0  Doing errands, shopping? 0  Preparing Food and eating ? N  Using the Toilet? N  In the past six months,  have you accidently leaked urine? N  Do you have problems with loss of bowel control? N  Managing your Medications? N  Managing your Finances? N  Housekeeping or managing your Housekeeping? N    Patient Care Team: Sherlene Shams, MD as PCP - General (Internal Medicine) Van Clines, MD as Consulting Physician (Neurology)  Indicate any recent Medical Services you may have received from other than [facility] providers in the past year (date may be approximate).     Assessment:   This is  a routine wellness examination for [place].  Patient Medicare AWV questionnaire was completed by the patient on 04/08/22, I have confirmed that all information answered by patient is correct and no changes since this date.   I connected with  Chiann Plaster Belshe on 04/12/22 by a audio enabled telemedicine application and verified that I am speaking with the correct person using two identifiers.  Patient Location: Home  Provider Location: Office/Clinic  I discussed the limitations of evaluation and management by telemedicine. The patient expressed understanding and agreed to proceed.   Hearing/Vision screen Hearing Screening - Comments:: Followed by [facility] Difficulty hearing conversational tones Wears hearing aids   Vision Screening - Comments:: Followed by [facility] Wears corrective lenses Cataract extraction, L eye only They have regular follow up with the ophthalmologist    Dietary issues and exercise activities discussed: Current Exercise Habits: Home exercise routine, Type of exercise: treadmill, Time (Minutes): 30, Frequency (Times/Week): 5, Weekly Exercise (Minutes/Week): 150, Intensity: Mild Healthy diet/low cholesterol  Good water intake   Goals Addressed               This Visit's Progress     Patient Stated     I would like to eat a healthier diet (pt-stated)   On track     Stay active  Stay hydrated  Maintain weight 137lb       Depression Screen    04/12/2022     8:52 AM 01/15/2022   12:58 PM 11/15/2021    8:04 AM 07/16/2021   10:31 AM 04/10/2021    9:06 AM 02/08/2021    9:09 AM 12/12/2020    4:29 PM  PHQ 2/9 Scores  PHQ - 2 Score 0 0 0 0 0 0 0    Fall Risk    04/08/2022    1:45 PM 01/15/2022   12:58 PM 11/15/2021    8:04 AM 08/06/2021   11:30 AM 07/16/2021   10:31 AM  Fall Risk   Falls in the past year? 0 0 0 0 0  Number falls in past yr: 0 0 0 0   Injury with Fall? 0 0 0 0   Risk for fall due to :  No Fall Risks No Fall Risks  No Fall Risks  Follow up Falls evaluation completed;Falls prevention discussed Falls evaluation completed Falls evaluation completed  Falls evaluation completed    FALL RISK PREVENTION PERTAINING TO THE HOME: Home free of loose throw rugs in walkways, pet beds, electrical cords, etc? Yes  Adequate lighting in your home to reduce risk of falls? Yes   ASSISTIVE DEVICES UTILIZED TO PREVENT FALLS: Life alert apple watch? Yes  Use of a cane, walker or w/c? No  Grab bars in the bathroom? No  Shower chair or bench in shower? No  Elevated toilet seat or a handicapped toilet? No   TIMED UP AND GO: Was the test performed? No .   Cognitive Function:    04/01/2017    9:37 AM 03/26/2016    9:28 AM 03/14/2015    8:32 AM  MMSE - Mini Mental State Exam  Orientation to time 5 5 5   Orientation to Place 5 5 5   Registration 3 3 3   Attention/ Calculation 5 5 5   Recall 3 3 3   Language- name 2 objects 2 2 2   Language- repeat 1 1 1   Language- follow 3 step command 3 3 3   Language- read & follow direction 1 1 1   Write a sentence 1  1 1  Copy design 1 1 1   Total score 30 30 30         04/12/2022    8:53 AM 04/10/2021    9:34 AM 04/07/2019    9:31 AM 04/03/2018    8:57 AM  6CIT Screen  What Year? 0 points 0 points 0 points 0 points  What month? 0 points 0 points 0 points 0 points  What time? 0 points 0 points 0 points 0 points  Count back from 20 0 points   0 points  Months in reverse 0 points 0 points  0 points   Repeat phrase 0 points   0 points  Total Score 0 points   0 points    Immunizations Immunization History  Administered Date(s) Administered   Fluad Quad(high Dose 65+) 10/12/2018, 10/15/2019, 11/07/2020, 10/23/2021   Hepb-cpg 04/18/2021, 05/21/2021   Influenza Whole 10/22/2011   Influenza, High Dose Seasonal PF 10/18/2016, 10/21/2017   Influenza-Unspecified 11/05/2012, 10/11/2013, 10/06/2014, 10/15/2015   Moderna Covid-19 Vaccine Bivalent Booster 73yrs & up 11/21/2020   PFIZER(Purple Top)SARS-COV-2 Vaccination 02/02/2019, 02/23/2019, 11/01/2019, 05/03/2020   Pneumococcal Conjugate-13 10/12/2013   Pneumococcal Polysaccharide-23 07/15/2012, 01/02/2018   Tdap 12/30/2011, 02/02/2021   Zoster Recombinat (Shingrix) 10/07/2017, 12/09/2017   Zoster, Live 07/22/2011   Screening Tests Health Maintenance  Topic Date Due   HEMOGLOBIN A1C  01/15/2022   Diabetic kidney evaluation - Urine ACR  02/08/2022   COVID-19 Vaccine (6 - 2023-24 season) 04/28/2022 (Originally 09/28/2021)   MAMMOGRAM  08/30/2022   OPHTHALMOLOGY EXAM  12/13/2022   Diabetic kidney evaluation - eGFR measurement  12/28/2022   Medicare Annual Wellness (AWV)  04/12/2023   DTaP/Tdap/Td (3 - Td or Tdap) 02/03/2031   Pneumonia Vaccine 68+ Years old  Completed   INFLUENZA VACCINE  Completed   DEXA SCAN  Completed   Zoster Vaccines- Shingrix  Completed   HPV VACCINES  Aged Out    Health Maintenance Health Maintenance Due  Topic Date Due   HEMOGLOBIN A1C  01/15/2022   Diabetic kidney evaluation - Urine ACR  02/08/2022   Mammogram- ordered per consent. Number provided for scheduling. [phone].   Lung Cancer Screening: (Low Dose CT Chest recommended if Age 17-80 years, 30 pack-year currently smoking OR have quit w/in 15years.) does not qualify.   Hepatitis C Screening: does not qualify.  Vision Screening: Recommended annual ophthalmology exams for early detection of glaucoma and other disorders of the eye.  Dental  Screening: Recommended annual dental exams for proper oral hygiene  Community Resource Referral / Chronic Care Management: CRR required this visit?  No   CCM required this visit?  No      Plan:     I have personally reviewed and noted the following in the patient's chart:   Medical and social history Use of alcohol, tobacco or illicit drugs  Current medications and supplements including opioid prescriptions. Patient is not currently taking opioid prescriptions. Functional ability and status Nutritional status Physical activity Advanced directives List of other physicians Hospitalizations, surgeries, and ER visits in previous 12 months Vitals Screenings to include cognitive, depression, and falls Referrals and appointments  In addition, I have reviewed and discussed with patient certain preventive protocols, quality metrics, and best practice recommendations. A written personalized care plan for preventive services as well as general preventive health recommendations were provided to patient.     Braxden Lovering L Motley, LPN   [id number]     I have reviewed the above information and agree with above.  Duncan Dull, MD

## 2022-05-20 DIAGNOSIS — R82998 Other abnormal findings in urine: Secondary | ICD-10-CM | POA: Diagnosis not present

## 2022-05-20 DIAGNOSIS — N1831 Chronic kidney disease, stage 3a: Secondary | ICD-10-CM | POA: Diagnosis not present

## 2022-05-20 DIAGNOSIS — R894 Abnormal immunological findings in specimens from other organs, systems and tissues: Secondary | ICD-10-CM | POA: Diagnosis not present

## 2022-05-20 DIAGNOSIS — R600 Localized edema: Secondary | ICD-10-CM | POA: Diagnosis not present

## 2022-05-20 DIAGNOSIS — R779 Abnormality of plasma protein, unspecified: Secondary | ICD-10-CM | POA: Diagnosis not present

## 2022-05-20 DIAGNOSIS — I10 Essential (primary) hypertension: Secondary | ICD-10-CM | POA: Diagnosis not present

## 2022-05-20 DIAGNOSIS — E782 Mixed hyperlipidemia: Secondary | ICD-10-CM | POA: Diagnosis not present

## 2022-05-27 DIAGNOSIS — N182 Chronic kidney disease, stage 2 (mild): Secondary | ICD-10-CM | POA: Diagnosis not present

## 2022-05-27 DIAGNOSIS — R894 Abnormal immunological findings in specimens from other organs, systems and tissues: Secondary | ICD-10-CM | POA: Diagnosis not present

## 2022-05-27 DIAGNOSIS — I10 Essential (primary) hypertension: Secondary | ICD-10-CM | POA: Diagnosis not present

## 2022-07-05 ENCOUNTER — Inpatient Hospital Stay: Payer: Medicare Other | Attending: Oncology

## 2022-07-05 DIAGNOSIS — D472 Monoclonal gammopathy: Secondary | ICD-10-CM | POA: Insufficient documentation

## 2022-07-05 DIAGNOSIS — D696 Thrombocytopenia, unspecified: Secondary | ICD-10-CM | POA: Diagnosis not present

## 2022-07-05 DIAGNOSIS — I129 Hypertensive chronic kidney disease with stage 1 through stage 4 chronic kidney disease, or unspecified chronic kidney disease: Secondary | ICD-10-CM | POA: Insufficient documentation

## 2022-07-05 DIAGNOSIS — N189 Chronic kidney disease, unspecified: Secondary | ICD-10-CM | POA: Insufficient documentation

## 2022-07-05 DIAGNOSIS — Z806 Family history of leukemia: Secondary | ICD-10-CM | POA: Diagnosis not present

## 2022-07-05 DIAGNOSIS — Z90710 Acquired absence of both cervix and uterus: Secondary | ICD-10-CM | POA: Insufficient documentation

## 2022-07-05 LAB — COMPREHENSIVE METABOLIC PANEL
ALT: 17 U/L (ref 0–44)
AST: 21 U/L (ref 15–41)
Albumin: 4 g/dL (ref 3.5–5.0)
Alkaline Phosphatase: 53 U/L (ref 38–126)
Anion gap: 8 (ref 5–15)
BUN: 22 mg/dL (ref 8–23)
CO2: 29 mmol/L (ref 22–32)
Calcium: 9.4 mg/dL (ref 8.9–10.3)
Chloride: 106 mmol/L (ref 98–111)
Creatinine, Ser: 0.9 mg/dL (ref 0.44–1.00)
GFR, Estimated: >60 mL/min (ref >60)
Glucose, Bld: 126 mg/dL — ABNORMAL HIGH (ref 70–99)
Potassium: 4.4 mmol/L (ref 3.5–5.1)
Sodium: 143 mmol/L (ref 135–145)
Total Bilirubin: 0.7 mg/dL (ref 0.3–1.2)
Total Protein: 6.1 g/dL — ABNORMAL LOW (ref 6.5–8.1)

## 2022-07-05 LAB — CBC WITH DIFFERENTIAL/PLATELET
Abs Immature Granulocytes: 0.01 10*3/uL (ref 0.00–0.07)
Basophils Absolute: 0 10*3/uL (ref 0.0–0.1)
Basophils Relative: 0 %
Eosinophils Absolute: 0 10*3/uL (ref 0.0–0.5)
Eosinophils Relative: 1 %
HCT: 39.7 % (ref 36.0–46.0)
Hemoglobin: 13 g/dL (ref 12.0–15.0)
Immature Granulocytes: 0 %
Lymphocytes Relative: 24 %
Lymphs Abs: 1.1 10*3/uL (ref 0.7–4.0)
MCH: 33.2 pg (ref 26.0–34.0)
MCHC: 32.7 g/dL (ref 30.0–36.0)
MCV: 101.3 fL — ABNORMAL HIGH (ref 80.0–100.0)
Monocytes Absolute: 0.2 10*3/uL (ref 0.1–1.0)
Monocytes Relative: 4 %
Neutro Abs: 3.2 10*3/uL (ref 1.7–7.7)
Neutrophils Relative %: 71 %
Platelets: 123 10*3/uL — ABNORMAL LOW (ref 150–400)
RBC: 3.92 MIL/uL (ref 3.87–5.11)
RDW: 11.9 % (ref 11.5–15.5)
WBC: 4.5 10*3/uL (ref 4.0–10.5)
nRBC: 0 % (ref 0.0–0.2)

## 2022-07-05 LAB — TECHNOLOGIST SMEAR REVIEW
Plt Morphology: NORMAL
RBC MORPHOLOGY: NORMAL
WBC MORPHOLOGY: NORMAL

## 2022-07-05 LAB — IMMATURE PLATELET FRACTION: Immature Platelet Fraction: 3.5 % (ref 1.2–8.6)

## 2022-07-10 LAB — KAPPA/LAMBDA LIGHT CHAINS
Kappa free light chain: 107.4 mg/L — ABNORMAL HIGH (ref 3.3–19.4)
Kappa, lambda light chain ratio: 11.07 — ABNORMAL HIGH (ref 0.26–1.65)
Lambda free light chains: 9.7 mg/L (ref 5.7–26.3)

## 2022-07-12 DIAGNOSIS — L608 Other nail disorders: Secondary | ICD-10-CM | POA: Diagnosis not present

## 2022-07-12 DIAGNOSIS — L570 Actinic keratosis: Secondary | ICD-10-CM | POA: Diagnosis not present

## 2022-07-12 DIAGNOSIS — L821 Other seborrheic keratosis: Secondary | ICD-10-CM | POA: Diagnosis not present

## 2022-07-12 LAB — MULTIPLE MYELOMA PANEL, SERUM
Albumin SerPl Elph-Mcnc: 3.8 g/dL (ref 2.9–4.4)
Albumin/Glob SerPl: 2.2 — ABNORMAL HIGH (ref 0.7–1.7)
Alpha 1: 0.2 g/dL (ref 0.0–0.4)
Alpha2 Glob SerPl Elph-Mcnc: 0.5 g/dL (ref 0.4–1.0)
B-Globulin SerPl Elph-Mcnc: 0.7 g/dL (ref 0.7–1.3)
Gamma Glob SerPl Elph-Mcnc: 0.4 g/dL (ref 0.4–1.8)
Globulin, Total: 1.8 g/dL — ABNORMAL LOW (ref 2.2–3.9)
IgA: 46 mg/dL — ABNORMAL LOW (ref 64–422)
IgG (Immunoglobin G), Serum: 484 mg/dL — ABNORMAL LOW (ref 586–1602)
IgM (Immunoglobulin M), Srm: 82 mg/dL (ref 26–217)
M Protein SerPl Elph-Mcnc: 0.2 g/dL — ABNORMAL HIGH
Total Protein ELP: 5.6 g/dL — ABNORMAL LOW (ref 6.0–8.5)

## 2022-07-15 ENCOUNTER — Inpatient Hospital Stay (HOSPITAL_BASED_OUTPATIENT_CLINIC_OR_DEPARTMENT_OTHER): Payer: Medicare Other | Admitting: Oncology

## 2022-07-15 ENCOUNTER — Encounter: Payer: Self-pay | Admitting: Oncology

## 2022-07-15 VITALS — BP 105/70 | HR 69 | Temp 97.4°F | Resp 18 | Wt 136.7 lb

## 2022-07-15 DIAGNOSIS — D472 Monoclonal gammopathy: Secondary | ICD-10-CM | POA: Diagnosis not present

## 2022-07-15 DIAGNOSIS — N189 Chronic kidney disease, unspecified: Secondary | ICD-10-CM | POA: Diagnosis not present

## 2022-07-15 DIAGNOSIS — I129 Hypertensive chronic kidney disease with stage 1 through stage 4 chronic kidney disease, or unspecified chronic kidney disease: Secondary | ICD-10-CM | POA: Diagnosis not present

## 2022-07-15 DIAGNOSIS — D696 Thrombocytopenia, unspecified: Secondary | ICD-10-CM | POA: Diagnosis not present

## 2022-07-15 DIAGNOSIS — Z806 Family history of leukemia: Secondary | ICD-10-CM | POA: Diagnosis not present

## 2022-07-15 DIAGNOSIS — Z90710 Acquired absence of both cervix and uterus: Secondary | ICD-10-CM | POA: Diagnosis not present

## 2022-07-15 NOTE — Assessment & Plan Note (Addendum)
Previous workup includes normal vitamin B12, normal folate, negative HIV, negative hepatitis panel, negative ANA, normal immature platelet fraction.  Peripheral blood flow cytometry is negative for significant immunophenotypic abnormality.  Labs reviewed and discussed with patient. Stable counts.  Continue to monitor.

## 2022-07-15 NOTE — Assessment & Plan Note (Addendum)
#.    IgG kappa MGUS Labs reviewed and discussed with Multiple myeloma panel showed M protein of 0.1, immunofixation showed IgG monoclonal protein with kappa light chain. [Previously specificity. a biclonal IgG protein with kappa specificity] Lab Results  Component Value Date   MPROTEIN 0.2 (H) 07/05/2022   KPAFRELGTCHN 107.4 (H) 07/05/2022   LAMBDASER 9.7 07/05/2022   KAPLAMBRATIO 11.07 (H) 07/05/2022    Overall stable. Obtain 24 hour UPEP Recommend monitoring repeat blood work every 6 months.

## 2022-07-15 NOTE — Progress Notes (Signed)
Hematology/Oncology Progress note [phone] [phone]            Patient Care Team: Sherlene Shams, MD as PCP - General (Internal Medicine) Van Clines, MD as Consulting Physician (Neurology)  ASSESSMENT & PLAN:   MGUS (monoclonal gammopathy of unknown significance) #.  IgG kappa MGUS Labs reviewed and discussed with Multiple myeloma panel showed M protein of 0.1, immunofixation showed IgG monoclonal protein with kappa light chain. [Previously specificity. a biclonal IgG protein with kappa specificity] Lab Results  Component Value Date   MPROTEIN 0.2 (H) 07/05/2022   KPAFRELGTCHN 107.4 (H) 07/05/2022   LAMBDASER 9.7 07/05/2022   KAPLAMBRATIO 11.07 (H) 07/05/2022    Overall stable. Obtain 24 hour UPEP Recommend monitoring repeat blood work every 6 months.  Thrombocytopenia (HCC) Previous workup includes normal vitamin B12, normal folate, negative HIV, negative hepatitis panel, negative ANA, normal immature platelet fraction.  Peripheral blood flow cytometry is negative for significant immunophenotypic abnormality.  Labs reviewed and discussed with patient. Stable counts.  Continue to monitor.   Orders Placed This Encounter  Procedures   IFE+PROTEIN ELECTRO, 24-HR UR    Standing Status:   Future    Standing Expiration Date:   07/15/2023   CBC with Differential (Cancer Center Only)    Standing Status:   Future    Standing Expiration Date:   07/15/2023   CMP (Cancer Center only)    Standing Status:   Future    Standing Expiration Date:   07/15/2023   Multiple Myeloma Panel (SPEP&IFE w/QIG)    Standing Status:   Future    Standing Expiration Date:   07/15/2023   Kappa/lambda light chains    Standing Status:   Future    Standing Expiration Date:   07/15/2023   Immature Platelet Fraction    Standing Status:   Future    Standing Expiration Date:   07/15/2023   IFE+PROTEIN ELECTRO, 24-HR UR    Standing Status:   Future    Standing Expiration  Date:   07/15/2023   Technologist smear review    Standing Status:   Future    Standing Expiration Date:   07/15/2023    Order Specific Question:   Clinical information:    Answer:   MGUS    Follow up in 6 months.  All questions were answered. The patient knows to call the clinic with any problems, questions or concerns.  Rickard Patience, MD, PhD [facility] Hematology Oncology 07/15/2022   CHIEF COMPLAINTS/REASON FOR VISIT:  MGUS  HISTORY OF PRESENTING ILLNESS:   Tina Silva is a  88 y.o.  female presents for MGUS  Patient has CKD, follows up with nephrology.  Work up showed  02/21/2021, free light chain ratio 10.4  dad ? MM, m grandmother MM.  03/29/2021, free light chain ratio 11.12 Random urine protein electrophoresis showed faint albumin band detected on urine protein electrophoresis.  No abnormal Bence Jones proteinuria. 04/05/2021, serum protein electrophoresis showed a 2 abnormal protein bands detected in the gamma globulin.  She has chronic history of thrombocytopenia, recent cbc showed platelet count of 111, which is slightly lower than her baseline.  Denies any easy bruising or active bleeding events.  Denies any alcohol use   INTERVAL HISTORY Tina Silva is a 88 y.o. female who has above history reviewed by me today presents for follow up visit for MGUS.  Patient presents to review lab results.  No new complaints.  Denies any recent infections, vaccinations.  Review of Systems  Constitutional:  Positive for fatigue. Negative for appetite change, chills and fever.  HENT:   Negative for hearing loss and voice change.   Eyes:  Negative for eye problems.  Respiratory:  Negative for chest tightness and cough.   Cardiovascular:  Negative for chest pain.  Gastrointestinal:  Negative for abdominal distention, abdominal pain and blood in stool.  Endocrine: Negative for hot flashes.  Genitourinary:  Negative for difficulty urinating and frequency.   Musculoskeletal:  Negative  for arthralgias.  Skin:  Negative for itching and rash.  Neurological:  Negative for extremity weakness.  Hematological:  Negative for adenopathy.  Psychiatric/Behavioral:  Negative for confusion.     MEDICAL HISTORY:  Past Medical History:  Diagnosis Date   Hyperlipidemia    Hypertension    Hypothyroidism    Osteoporosis    Vertigo 07/28/2016    SURGICAL HISTORY: Past Surgical History:  Procedure Laterality Date   ABDOMINAL HYSTERECTOMY     APPENDECTOMY  1974   BREAST BIOPSY Left 11/04/2005   core   COLONOSCOPY WITH PROPOFOL N/A 02/12/2016   Procedure: COLONOSCOPY WITH PROPOFOL;  Surgeon: Scot Jun, MD;  Location: [facility] ENDOSCOPY;  Service: Endoscopy;  Laterality: N/A;   EYE SURGERY  june 2013   cataract with lens,  dinglein left eye    TONSILLECTOMY     TONSILLECTOMY AND ADENOIDECTOMY      SOCIAL HISTORY: Social History   Socioeconomic History   Marital status: Single    Spouse name: Not on file   Number of children: Not on file   Years of education: Not on file   Highest education level: Bachelor's degree (e.g., BA, AB, BS)  Occupational History   Not on file  Tobacco Use   Smoking status: Never   Smokeless tobacco: Never  Vaping Use   Vaping Use: Never used  Substance and Sexual Activity   Alcohol use: No   Drug use: No   Sexual activity: Never  Other Topics Concern   Not on file  Social History Narrative   Independent at baseline   Left handed    Social Determinants of Health   Financial Resource Strain: Low Risk  (07/13/2022)   Overall Financial Resource Strain (CARDIA)    Difficulty of Paying Living Expenses: Not hard at all  Food Insecurity: No Food Insecurity (07/13/2022)   Hunger Vital Sign    Worried About Running Out of Food in the Last Year: Never true    Ran Out of Food in the Last Year: Never true  Transportation Needs: No Transportation Needs (07/13/2022)   PRAPARE - Administrator, Civil Service (Medical): No    Lack of  Transportation (Non-Medical): No  Physical Activity: Sufficiently Active (07/13/2022)   Exercise Vital Sign    Days of Exercise per Week: 5 days    Minutes of Exercise per Session: 30 min  Stress: No Stress Concern Present (07/13/2022)   [employer] of Occupational Health - Occupational Stress Questionnaire    Feeling of Stress : Not at all  Social Connections: Moderately Integrated (07/13/2022)   Social Connection and Isolation Panel [NHANES]    Frequency of Communication with Friends and Family: More than three times a week    Frequency of Social Gatherings with Friends and Family: More than three times a week    Attends Religious Services: More than 4 times per year    Active Member of [employer] or Organizations: Yes    Attends Banker  Meetings: More than 4 times per year    Marital Status: Never married  Intimate Partner Violence: Not At Risk (04/12/2022)   Humiliation, Afraid, Rape, and Kick questionnaire    Fear of Current or Ex-Partner: No    Emotionally Abused: No    Physically Abused: No    Sexually Abused: No    FAMILY HISTORY: Family History  Problem Relation Age of Onset   Parkinson's disease Mother    Cancer Sister        AML   Diabetes Sister    Heart disease Father    Diabetes Paternal Aunt    Cancer Maternal Grandmother    Breast cancer Neg Hx     ALLERGIES:  is allergic to lidocaine-epinephrine, ciprofloxacin, doxycycline, flagyl [metronidazole], iodine, lidocaine, and sudafed [pseudoephedrine hcl].  MEDICATIONS:  Current Outpatient Medications  Medication Sig Dispense Refill   aspirin EC 81 MG tablet Take 81 mg by mouth daily. Swallow whole.     Calcium Carbonate-Vit D-Min (CALTRATE PLUS PO) Take 1,200 mg by mouth daily.     Cholecalciferol (VITAMIN D3) 1000 units CAPS Take 1 capsule by mouth.     losartan (COZAAR) 25 MG tablet      metoprolol succinate (TOPROL-XL) 25 MG 24 hr tablet TAKE 1/2 TABLET TWICE A DAY 90 tablet 1   Multiple Vitamin  (MULTIVITAMIN) tablet Take 1 tablet by mouth daily.     rosuvastatin (CRESTOR) 10 MG tablet TAKE 1 TABLET BY MOUTH DAILY 90 tablet 3   No current facility-administered medications for this visit.     PHYSICAL EXAMINATION: ECOG PERFORMANCE STATUS: 1 - Symptomatic but completely ambulatory Vitals:   07/15/22 1010  BP: 105/70  Pulse: 69  Resp: 18  Temp: (!) 97.4 F (36.3 C)  SpO2: 100%   Filed Weights   07/15/22 1010  Weight: 136 lb 11.2 oz (62 kg)    Physical Exam Constitutional:      General: She is not in acute distress. HENT:     Head: Normocephalic and atraumatic.  Eyes:     General: No scleral icterus. Cardiovascular:     Rate and Rhythm: Normal rate and regular rhythm.     Heart sounds: Normal heart sounds.  Pulmonary:     Effort: Pulmonary effort is normal. No respiratory distress.     Breath sounds: No wheezing.  Abdominal:     General: Bowel sounds are normal. There is no distension.     Palpations: Abdomen is soft.  Musculoskeletal:        General: No deformity. Normal range of motion.     Cervical back: Normal range of motion and neck supple.  Skin:    General: Skin is warm and dry.     Findings: No erythema or rash.  Neurological:     Mental Status: She is alert and oriented to person, place, and time. Mental status is at baseline.     Cranial Nerves: No cranial nerve deficit.     Coordination: Coordination normal.  Psychiatric:        Mood and Affect: Mood normal.     LABORATORY DATA:  I have reviewed the data as listed    Latest Ref Rng & Units 07/05/2022   10:26 AM 01/31/2022   10:39 AM 12/27/2021   10:58 AM  CBC  WBC 4.0 - 10.5 K/uL 4.5  4.6  3.8   Hemoglobin 12.0 - 15.0 g/dL 33.2  95.1  88.4   Hematocrit 36.0 - 46.0 % 39.7  39.3  40.7   Platelets 150 - 400 K/uL 123  131  108       Latest Ref Rng & Units 07/05/2022   10:26 AM 12/27/2021   10:58 AM 05/29/2021   12:05 PM  CMP  Glucose 70 - 99 mg/dL 161  99  95   BUN 8 - 23 mg/dL 22  27  26     Creatinine 0.44 - 1.00 mg/dL 0.96  0.45  4.09   Sodium 135 - 145 mmol/L 143  140  136   Potassium 3.5 - 5.1 mmol/L 4.4  4.3  4.2   Chloride 98 - 111 mmol/L 106  103  101   CO2 22 - 32 mmol/L 29  30  31    Calcium 8.9 - 10.3 mg/dL 9.4  9.1  9.5   Total Protein 6.5 - 8.1 g/dL 6.1  6.8  6.8   Total Bilirubin 0.3 - 1.2 mg/dL 0.7  0.9  0.8   Alkaline Phos 38 - 126 U/L 53  52  60   AST 15 - 41 U/L 21  21  20    ALT 0 - 44 U/L 17  23  21      RADIOGRAPHIC STUDIES: I have personally reviewed the radiological images as listed and agreed with the findings in the report. No results found.

## 2022-07-17 ENCOUNTER — Ambulatory Visit: Payer: Medicare Other | Admitting: Internal Medicine

## 2022-07-23 ENCOUNTER — Other Ambulatory Visit: Payer: Self-pay

## 2022-07-23 DIAGNOSIS — Z90710 Acquired absence of both cervix and uterus: Secondary | ICD-10-CM | POA: Diagnosis not present

## 2022-07-23 DIAGNOSIS — D472 Monoclonal gammopathy: Secondary | ICD-10-CM | POA: Diagnosis not present

## 2022-07-23 DIAGNOSIS — I129 Hypertensive chronic kidney disease with stage 1 through stage 4 chronic kidney disease, or unspecified chronic kidney disease: Secondary | ICD-10-CM | POA: Diagnosis not present

## 2022-07-23 DIAGNOSIS — N189 Chronic kidney disease, unspecified: Secondary | ICD-10-CM | POA: Diagnosis not present

## 2022-07-23 DIAGNOSIS — Z806 Family history of leukemia: Secondary | ICD-10-CM | POA: Diagnosis not present

## 2022-07-23 DIAGNOSIS — D696 Thrombocytopenia, unspecified: Secondary | ICD-10-CM | POA: Diagnosis not present

## 2022-07-24 DIAGNOSIS — I10 Essential (primary) hypertension: Secondary | ICD-10-CM | POA: Diagnosis not present

## 2022-07-24 DIAGNOSIS — R894 Abnormal immunological findings in specimens from other organs, systems and tissues: Secondary | ICD-10-CM | POA: Diagnosis not present

## 2022-07-24 DIAGNOSIS — N182 Chronic kidney disease, stage 2 (mild): Secondary | ICD-10-CM | POA: Diagnosis not present

## 2022-07-26 LAB — IFE+PROTEIN ELECTRO, 24-HR UR
% BETA, Urine: 0 %
ALPHA 1 URINE: 0 %
Albumin, U: 0 %
Alpha 2, Urine: 0 %
GAMMA GLOBULIN URINE: 0 %
Total Protein, Urine-Ur/day: <86 mg/24 hr (ref 30–150)
Total Protein, Urine: <4 mg/dL
Total Volume: 2150

## 2022-07-31 DIAGNOSIS — N1831 Chronic kidney disease, stage 3a: Secondary | ICD-10-CM | POA: Diagnosis not present

## 2022-07-31 DIAGNOSIS — D472 Monoclonal gammopathy: Secondary | ICD-10-CM | POA: Diagnosis not present

## 2022-07-31 DIAGNOSIS — I10 Essential (primary) hypertension: Secondary | ICD-10-CM | POA: Diagnosis not present

## 2022-08-09 ENCOUNTER — Ambulatory Visit (INDEPENDENT_AMBULATORY_CARE_PROVIDER_SITE_OTHER): Payer: Medicare Other | Admitting: Neurology

## 2022-08-09 ENCOUNTER — Encounter: Payer: Self-pay | Admitting: Neurology

## 2022-08-09 VITALS — BP 124/71 | HR 72 | Ht 63.0 in | Wt 138.4 lb

## 2022-08-09 DIAGNOSIS — R29818 Other symptoms and signs involving the nervous system: Secondary | ICD-10-CM | POA: Diagnosis not present

## 2022-08-09 DIAGNOSIS — R2689 Other abnormalities of gait and mobility: Secondary | ICD-10-CM | POA: Diagnosis not present

## 2022-08-09 NOTE — Progress Notes (Signed)
NEUROLOGY FOLLOW UP OFFICE NOTE  Tina Silva [id number] 1935/01/10  HISTORY OF PRESENT ILLNESS: I had the pleasure of seeing Tina Silva in follow-up in the neurology clinic on 08/09/2022.  The patient was last seen a year ago. She had 2 episodes of imbalance in 2022. Brain MRI no acute changes, moderate chronic microvascular disease. Carotid doppler no significant stenosis. MRA head done 01/2021 no flow-limiting stenosis. There was note of atherosclerotic disease with mild irregularity of the left M2 MCA vessels, there was a 1-4mm inferiorly projecting vascular protrusion arising from the cavernous left ICA which may reflect a small aneurysm. EEG in 01/2021 was normal.   Since her last visit, she had a repeat MRA head without contrast in 07/2021 which did not show any significant stenosis, unchanged 1-27mm protrusion from right cavernous ICA, indeterminate for tiny aneurysm. She saw her PCP in 10/2021 for a dizzy spell that completely resolved, she woke up and sat on the side of the bed feeling like her brain was "waving" but things around her were not spinning. It stopped after a minute, but when she got up to walk, she was going a little to the left side. This lasted for a day. She saw her PCP and had a brain MRI which did not show any acute changes. She did not check her BP that day but reports that she stays hydrated. She had seen her nephrologist and had abnormal bloodwork, leading to a diagnosis of Multiple Myeloma. She reports kidney function has improved. She denies any headaches, nausea/vomiting, focal numbness/tingling/weakness. She was alone and denies any confusion. No tinnitus. She has hearing aides. She continues on daily aspirin, statin. BP today 124/71.   She notes in the past 1-2 years, when she positions her left hand a certain way, it would have tremors that stop when she moves position. She feels these started after she was sitting one day and felt a buzzing sensation in the middle  of her back. She sat up and sensation sent away. Her mother and maternal grandfather had Parkinson's disease. She denies any chest pain, shortness of breath, palpitations.   Lab Results  Component Value Date   CHOL 131 02/08/2021   HDL 49.90 02/08/2021   LDLCALC 55 02/08/2021   TRIG 131.0 02/08/2021   CHOLHDL 3 02/08/2021   Lab Results  Component Value Date   HGBA1C 5.1 07/16/2021    History on Initial Assessment 01/30/2021: This is a pleasant 88 year old left-handed woman with a history of hypertension, hyperlipidemia, hypothyroidism, MGUS, macrocytosis, presenting for evaluation of abnormal brain MRI. MRI brain was ordered due to 2 episodes of imbalance. The first episode occurred in 08/2020, she was seated in her living and got up, then noticed she kept bumping the wall on her left side. This lasted for a day then resolved. There were no other associated symptoms. A month later on 10/21/20, she drove to the end of her street then her head hurt real bad on the vertex for a few seconds. She continued driving then noticed that "everything was black and white, there was no color, just like a black veil in front of me." She drove back home and as she got out of the car, she almost fell. Her balance was "really, really bad on the left," she had to hold on until she got inside. She closed her eyes, and 15 minutes later, color and balance was back. She denied feeling dizzy, no spinning/lightheaded sensation, nausea/vomiting, no diplopia, dysarthria/dysphagia, focal numbness/tingling/weakness,  bowel/bladder dysfunction. No loss of awareness. She denies any olfactory/gustatory hallucinations, myoclonic jerks. Sleep is good. She had seen her eye doctor and ENT with normal exams. She denies any further head pains, no recent head injuries, infections, or medication changes. No family history of similar symptoms.   Carotid dopplers in 11/2020 no significant stenosis. She had an MRI brain without contrast on  12/30/20 which I personally reviewed, no acute changes. There was moderate chronic microvascular disease, no change from 2018 imaging.   Laboratory Data: Lab Results  Component Value Date   CHOL 131 02/08/2021   HDL 49.90 02/08/2021   LDLCALC 55 02/08/2021   TRIG 131.0 02/08/2021   CHOLHDL 3 02/08/2021   Lab Results  Component Value Date   HGBA1C 5.1 07/16/2021    PAST MEDICAL HISTORY: Past Medical History:  Diagnosis Date   Hyperlipidemia    Hypertension    Hypothyroidism    Multiple myeloma (HCC)    Osteoporosis    Vertigo 07/28/2016    MEDICATIONS: Current Outpatient Medications on File Prior to Visit  Medication Sig Dispense Refill   aspirin EC 81 MG tablet Take 81 mg by mouth daily. Swallow whole.     Calcium Carbonate-Vit D-Min (CALTRATE PLUS PO) Take 1,200 mg by mouth daily.     Cholecalciferol (VITAMIN D3) 1000 units CAPS Take 1 capsule by mouth.     losartan (COZAAR) 25 MG tablet      metoprolol succinate (TOPROL-XL) 25 MG 24 hr tablet TAKE 1/2 TABLET TWICE A DAY 90 tablet 1   Multiple Vitamin (MULTIVITAMIN) tablet Take 1 tablet by mouth daily.     rosuvastatin (CRESTOR) 10 MG tablet TAKE 1 TABLET BY MOUTH DAILY 90 tablet 3   No current facility-administered medications on file prior to visit.    ALLERGIES: Allergies  Allergen Reactions   Lidocaine-Epinephrine Other (See Comments)    Chest tightness   Ciprofloxacin     Causes heart beat to skip   Doxycycline Other (See Comments)    Chest pain   Flagyl [Metronidazole]     Causes heart beat to skip   Iodine     Hot and tremmors   Lidocaine    Sudafed [Pseudoephedrine Hcl]     Felt like her body would explode.    FAMILY HISTORY: Family History  Problem Relation Age of Onset   Parkinson's disease Mother    Cancer Sister        AML   Diabetes Sister    Heart disease Father    Diabetes Paternal Aunt    Cancer Maternal Grandmother    Breast cancer Neg Hx     SOCIAL HISTORY: Social History    Socioeconomic History   Marital status: Single    Spouse name: Not on file   Number of children: Not on file   Years of education: Not on file   Highest education level: Bachelor's degree (e.g., BA, AB, BS)  Occupational History   Not on file  Tobacco Use   Smoking status: Never   Smokeless tobacco: Never  Vaping Use   Vaping status: Never Used  Substance and Sexual Activity   Alcohol use: No   Drug use: No   Sexual activity: Never  Other Topics Concern   Not on file  Social History Narrative   Independent at baseline   Left handed    Social Determinants of Health   Financial Resource Strain: Low Risk  (07/13/2022)   Overall Financial Resource Strain (CARDIA)  Difficulty of Paying Living Expenses: Not hard at all  Food Insecurity: No Food Insecurity (07/13/2022)   Hunger Vital Sign    Worried About Running Out of Food in the Last Year: Never true    Ran Out of Food in the Last Year: Never true  Transportation Needs: No Transportation Needs (07/13/2022)   PRAPARE - Administrator, Civil Service (Medical): No    Lack of Transportation (Non-Medical): No  Physical Activity: Sufficiently Active (07/13/2022)   Exercise Vital Sign    Days of Exercise per Week: 5 days    Minutes of Exercise per Session: 30 min  Stress: No Stress Concern Present (07/13/2022)   [employer] of Occupational Health - Occupational Stress Questionnaire    Feeling of Stress : Not at all  Social Connections: Moderately Integrated (07/13/2022)   Social Connection and Isolation Panel [NHANES]    Frequency of Communication with Friends and Family: More than three times a week    Frequency of Social Gatherings with Friends and Family: More than three times a week    Attends Religious Services: More than 4 times per year    Active Member of [employer] or Organizations: Yes    Attends Banker Meetings: More than 4 times per year    Marital Status: Never married  Intimate Partner  Violence: Not At Risk (04/12/2022)   Humiliation, Afraid, Rape, and Kick questionnaire    Fear of Current or Ex-Partner: No    Emotionally Abused: No    Physically Abused: No    Sexually Abused: No     PHYSICAL EXAM: Vitals:   08/09/22 0930  BP: 124/71  Pulse: 72  SpO2: 98%   General: No acute distress Head:  Normocephalic/atraumatic Skin/Extremities: No rash, no edema Neurological Exam: alert and awake. No aphasia or dysarthria. Fund of knowledge is appropriate.  Attention and concentration are normal.   Cranial nerves: Pupils equal, round. Extraocular movements intact with no nystagmus. Visual fields full.  No facial asymmetry.  Motor: Bulk and tone normal, muscle strength 5/5 throughout with no pronator drift. Reflexes +2 on both UE, +1 both LE. Sensation intact to temperature.  Finger to nose testing intact.  Gait narrow-based and steady, able to tandem walk adequately.  Romberg negative. Negative postural instability.    IMPRESSION: This is a pleasant 88 yo LH woman with a history of hypertension, hyperlipidemia, hypothyroidism, macrocytosis, with episodes of imbalance, brain MRI no acute changes, there was moderate chronic microvascular disease. Carotid doppler, MRA head did not show any significant flow-limiting stenosis. EEG normal. RA head showed a very small 1-4mm inferiorly projecting vascular protrusion arising from the cavernous left ICA, which may reflect a small aneurysm.  Repeat imaging 07/2021 showed unchanged 1-59mm protrusion from right cavernous ICA, indeterminate for tiny aneurysm. She had another episode of imbalance in 10/2021, repeat MRI no acute changes.Exam today normal. Symptoms possibly vestibular, less likely TIA. Continue daily aspirin, control of vascular risk factors. If symptoms recur, she was advised to contact our office and we will try to get her in for an exam when symptomatic. Continue to monitor BP and kidney function. FOllow-up in 1 year, call for any  changes.    Thank you for allowing me to participate in her care.  Please do not hesitate to call for any questions or concerns.   Patrcia Dolly, M.D.   CC: Dr. Darrick Huntsman

## 2022-08-09 NOTE — Patient Instructions (Signed)
Good to see you. Continue to monitor symptoms, if they recur, please call our office and we will try to get you in for an exam. Continue all your medications, monitor BP and kidney function with your doctors. Follow-up in 1 year, call for any changes.

## 2022-08-22 ENCOUNTER — Other Ambulatory Visit: Payer: Self-pay | Admitting: Internal Medicine

## 2022-08-28 ENCOUNTER — Encounter (INDEPENDENT_AMBULATORY_CARE_PROVIDER_SITE_OTHER): Payer: Self-pay

## 2022-09-02 ENCOUNTER — Ambulatory Visit
Admission: RE | Admit: 2022-09-02 | Discharge: 2022-09-02 | Disposition: A | Discharge status: Home / Self Care | Payer: Medicare Other | Source: Ambulatory Visit | Attending: Internal Medicine | Admitting: Internal Medicine

## 2022-09-02 DIAGNOSIS — Z1231 Encounter for screening mammogram for malignant neoplasm of breast: Secondary | ICD-10-CM | POA: Insufficient documentation

## 2022-09-19 ENCOUNTER — Ambulatory Visit (INDEPENDENT_AMBULATORY_CARE_PROVIDER_SITE_OTHER): Payer: Medicare Other | Admitting: Internal Medicine

## 2022-09-19 ENCOUNTER — Encounter: Payer: Self-pay | Admitting: Internal Medicine

## 2022-09-19 VITALS — BP 106/64 | HR 72 | Temp 98.2°F | Ht 63.0 in | Wt 133.6 lb

## 2022-09-19 DIAGNOSIS — M8580 Other specified disorders of bone density and structure, unspecified site: Secondary | ICD-10-CM

## 2022-09-19 DIAGNOSIS — Z780 Asymptomatic menopausal state: Secondary | ICD-10-CM

## 2022-09-19 DIAGNOSIS — I10 Essential (primary) hypertension: Secondary | ICD-10-CM | POA: Diagnosis not present

## 2022-09-19 DIAGNOSIS — N1831 Chronic kidney disease, stage 3a: Secondary | ICD-10-CM | POA: Diagnosis not present

## 2022-09-19 DIAGNOSIS — R7301 Impaired fasting glucose: Secondary | ICD-10-CM | POA: Diagnosis not present

## 2022-09-19 DIAGNOSIS — E785 Hyperlipidemia, unspecified: Secondary | ICD-10-CM | POA: Diagnosis not present

## 2022-09-19 DIAGNOSIS — D472 Monoclonal gammopathy: Secondary | ICD-10-CM | POA: Diagnosis not present

## 2022-09-19 DIAGNOSIS — I700 Atherosclerosis of aorta: Secondary | ICD-10-CM

## 2022-09-19 LAB — COMPREHENSIVE METABOLIC PANEL
ALT: 17 U/L (ref 0–35)
AST: 20 U/L (ref 0–37)
Albumin: 4.1 g/dL (ref 3.5–5.2)
Alkaline Phosphatase: 56 U/L (ref 39–117)
BUN: 20 mg/dL (ref 6–23)
CO2: 33 mEq/L — ABNORMAL HIGH (ref 19–32)
Calcium: 9.2 mg/dL (ref 8.4–10.5)
Chloride: 102 mEq/L (ref 96–112)
Creatinine, Ser: 0.96 mg/dL (ref 0.40–1.20)
GFR: 53.87 mL/min — ABNORMAL LOW (ref >60.00)
Glucose, Bld: 97 mg/dL (ref 70–99)
Potassium: 4.5 mEq/L (ref 3.5–5.1)
Sodium: 140 mEq/L (ref 135–145)
Total Bilirubin: 0.8 mg/dL (ref 0.2–1.2)
Total Protein: 5.9 g/dL — ABNORMAL LOW (ref 6.0–8.3)

## 2022-09-19 LAB — LIPID PANEL
Cholesterol: 112 mg/dL (ref 0–200)
HDL: 46.5 mg/dL (ref >39.00)
LDL Cholesterol: 43 mg/dL (ref 0–99)
NonHDL: 65.56
Total CHOL/HDL Ratio: 2
Triglycerides: 113 mg/dL (ref 0.0–149.0)
VLDL: 22.6 mg/dL (ref 0.0–40.0)

## 2022-09-19 LAB — LDL CHOLESTEROL, DIRECT: Direct LDL: 50 mg/dL

## 2022-09-19 MED ORDER — ROSUVASTATIN CALCIUM 10 MG PO TABS: 10.0000 mg | ORAL_TABLET | Freq: Every day | ORAL | 3 refills | Status: DC

## 2022-09-19 NOTE — Patient Instructions (Addendum)
The place under your right eye is not cancerous.  Dr Roseanne Kaufman could probably remove it but it may be $$$$  If Your blood pressure drops below 100 you may start to feel woozy, so your losartan should be suspended    Your  DEXA scan  been ordered.  You are encouraged (required) to call to schedule your own  appointment at [facility]  after the new year.   , and their phone number is [phone]    Get your flu vaccine after October 1

## 2022-09-19 NOTE — Progress Notes (Signed)
Subjective:  Patient ID: Tina Silva, female    DOB: 02/18/1934  Age: 88 y.o. MRN: [id number]  CC: The primary encounter diagnosis was Primary hypertension. Diagnoses of Hyperlipidemia with target LDL less than 100, Impaired fasting glucose, Osteopenia after menopause, MGUS (monoclonal gammopathy of unknown significance), Aortic atherosclerosis (HCC), and Stage 3a chronic kidney disease (CKD) (HCC) were also pertinent to this visit.   HPI Jora Logan Hook presents for  Chief Complaint  Patient presents with   Medical Management of Chronic Issues    6 month follow up    1) CKD stage 3 with MM vs MGUS:hs had additional testing by oncology,  MGUS diagnosed in June .  GFR improving,  now 63 ml/min   2) recurrent TIA:  has CVD  .  Sees [facility].  Most presentations have been dizziness,  intermittent with activity last MRI Oct 2023 .  No recent episodes.   3) HTN: taking metoprolol and losartan at low doses.  Does not check at home.  No orthostatic symptoms   4) intermittent insomnia:  Patient  has been having some trouble sleeping.  Wakes up 2 to 3 times per night . Bedtime hygiene reviewed,  Patient has been using an electronic book to read before bed.  Does not drink caffeinated beverages after 3 PM.  Only voids  bladder once per night.  No snoring partner. Does not drink alcohol to excess.  Not exercising excessively in the evening. Patient does have a history of anxiety but does not lie awake worrying about issues that cannot be resolved. Does not take stimulants. .   Lab Results  Component Value Date   HGBA1C 5.1 07/16/2021     Lab Results  Component Value Date   WBC 4.5 07/05/2022   HGB 13.0 07/05/2022   HCT 39.7 07/05/2022   MCV 101.3 (H) 07/05/2022   PLT 123 (L) 07/05/2022     Outpatient Medications Prior to Visit  Medication Sig Dispense Refill   aspirin EC 81 MG tablet Take 81 mg by mouth daily. Swallow whole.     Calcium Carbonate-Vit D-Min (CALTRATE PLUS  PO) Take 1,200 mg by mouth daily.     Cholecalciferol (VITAMIN D3) 1000 units CAPS Take 1 capsule by mouth.     losartan (COZAAR) 25 MG tablet      metoprolol succinate (TOPROL-XL) 25 MG 24 hr tablet TAKE 1/2 TABLET TWICE A DAY 90 tablet 1   Multiple Vitamin (MULTIVITAMIN) tablet Take 1 tablet by mouth daily.     rosuvastatin (CRESTOR) 10 MG tablet TAKE 1 TABLET BY MOUTH DAILY 90 tablet 3   No facility-administered medications prior to visit.    Review of Systems;  Patient denies headache, fevers, malaise, unintentional weight loss, skin rash, eye pain, sinus congestion and sinus pain, sore throat, dysphagia,  hemoptysis , cough, dyspnea, wheezing, chest pain, palpitations, orthopnea, edema, abdominal pain, nausea, melena, diarrhea, constipation, flank pain, dysuria, hematuria, urinary  Frequency, nocturia, numbness, tingling, seizures,  Focal weakness, Loss of consciousness,  Tremor, insomnia, depression, anxiety, and suicidal ideation.      Objective:  BP 106/64   Pulse 72   Temp 98.2 F (36.8 C) (Oral)   Ht 5\' 3"  (1.6 m)   Wt 133 lb 9.6 oz (60.6 kg)   SpO2 98%   BMI 23.67 kg/m   BP Readings from Last 3 Encounters:  09/19/22 106/64  08/09/22 124/71  07/15/22 105/70    Wt Readings from Last 3 Encounters:  09/19/22 133  lb 9.6 oz (60.6 kg)  08/09/22 138 lb 6.4 oz (62.8 kg)  07/15/22 136 lb 11.2 oz (62 kg)    Physical Exam Vitals reviewed.  Constitutional:      General: She is not in acute distress.    Appearance: Normal appearance. She is normal weight. She is not ill-appearing, toxic-appearing or diaphoretic.  HENT:     Head: Normocephalic.  Eyes:     General: No scleral icterus.       Right eye: No discharge.        Left eye: No discharge.     Conjunctiva/sclera: Conjunctivae normal.  Cardiovascular:     Rate and Rhythm: Normal rate and regular rhythm.     Heart sounds: Normal heart sounds.  Pulmonary:     Effort: Pulmonary effort is normal. No respiratory  distress.     Breath sounds: Normal breath sounds.  Musculoskeletal:        General: Normal range of motion.  Skin:    General: Skin is warm and dry.  Neurological:     General: No focal deficit present.     Mental Status: She is alert and oriented to person, place, and time. Mental status is at baseline.  Psychiatric:        Mood and Affect: Mood normal.        Behavior: Behavior normal.        Thought Content: Thought content normal.        Judgment: Judgment normal.    Lab Results  Component Value Date   HGBA1C 5.1 07/16/2021   HGBA1C 5.2 07/03/2017   HGBA1C 5.6 01/10/2012    Lab Results  Component Value Date   CREATININE 0.96 09/19/2022   CREATININE 0.90 07/05/2022   CREATININE 0.86 12/27/2021    Lab Results  Component Value Date   WBC 4.5 07/05/2022   HGB 13.0 07/05/2022   HCT 39.7 07/05/2022   PLT 123 (L) 07/05/2022   GLUCOSE 97 09/19/2022   CHOL 112 09/19/2022   TRIG 113.0 09/19/2022   HDL 46.50 09/19/2022   LDLDIRECT 50.0 09/19/2022   LDLCALC 43 09/19/2022   ALT 17 09/19/2022   AST 20 09/19/2022   NA 140 09/19/2022   K 4.5 09/19/2022   CL 102 09/19/2022   CREATININE 0.96 09/19/2022   BUN 20 09/19/2022   CO2 33 (H) 09/19/2022   TSH 2.59 07/16/2021   HGBA1C 5.1 07/16/2021   MICROALBUR <0.7 02/08/2021    Mammogram 3D SCREEN BREAST BILATERAL  Result Date: 09/03/2022 CLINICAL DATA:  Screening. EXAM: DIGITAL SCREENING BILATERAL MAMMOGRAM WITH TOMOSYNTHESIS AND CAD TECHNIQUE: Bilateral screening digital craniocaudal and mediolateral oblique mammograms were obtained. Bilateral screening digital breast tomosynthesis was performed. The images were evaluated with computer-aided detection. COMPARISON:  Previous exam(s). ACR Breast Density Category b: There are scattered areas of fibroglandular density. FINDINGS: There are no findings suspicious for malignancy. IMPRESSION: No mammographic evidence of malignancy. A result letter of this screening mammogram will be  mailed directly to the patient. RECOMMENDATION: Screening mammogram in one year. (Code:SM-B-01Y) BI-RADS CATEGORY  1: Negative. Electronically Signed   By: Hulan Saas M.D.   On: 09/03/2022 09:42    Assessment & Plan:  .Primary hypertension Assessment & Plan: Well controlled on current regimen. Renal function stable, no changes today.   Lab Results  Component Value Date   CREATININE 0.96 09/19/2022   Lab Results  Component Value Date   NA 140 09/19/2022   K 4.5 09/19/2022   CL 102 09/19/2022  CO2 33 (H) 09/19/2022     Orders: -     Comprehensive metabolic panel  Hyperlipidemia with target LDL less than 100 Assessment & Plan: She is taking a high  potency statin and LDL is <70.  No changes today. LFTs are normal;  Lab Results  Component Value Date   CHOL 112 09/19/2022   HDL 46.50 09/19/2022   LDLCALC 43 09/19/2022   LDLDIRECT 50.0 09/19/2022   TRIG 113.0 09/19/2022   CHOLHDL 2 09/19/2022   Lab Results  Component Value Date   ALT 17 09/19/2022   AST 20 09/19/2022   ALKPHOS 56 09/19/2022   BILITOT 0.8 09/19/2022     Orders: -     Lipid panel -     LDL cholesterol, direct  Impaired fasting glucose  Osteopenia after menopause -     DG Bone Density; Future  MGUS (monoclonal gammopathy of unknown significance) Assessment & Plan:  IgG kappa MGUS.  Multiple myeloma panel showed M protein of 0.1, immunofixation showed IgG monoclonal protein with kappa light chain. [Previously specificity. a biclonal IgG protein with kappa specificity] Light chain ratio 9.06  previous 24-hour urine protein electrophoresis did not detect any M spike. Recommend monitoring repeat blood work every 6 months.   Aortic atherosclerosis (HCC) Assessment & Plan: Aortic atherosclerosis :  she has switched from  pravastaitn to rosuvastatin     Stage 3a chronic kidney disease (CKD) (HCC) Assessment & Plan: Reviewed recent workup by  Nephrology.  Reviewed need to avoid NSAIDS, which  she is doing.  She has increased her water intake to 50-60 ounces daily   Lab Results  Component Value Date   CREATININE 0.96 09/19/2022      Other orders -     Rosuvastatin Calcium; Take 1 tablet (10 mg total) by mouth daily.  Dispense: 90 tablet; Refill: 3   Follow-up: Return in about 6 months (around 03/22/2023).   Sherlene Shams, MD

## 2022-09-21 NOTE — Assessment & Plan Note (Signed)
Well controlled on current regimen. Renal function stable, no changes today.   Lab Results  Component Value Date   CREATININE 0.96 09/19/2022   Lab Results  Component Value Date   NA 140 09/19/2022   K 4.5 09/19/2022   CL 102 09/19/2022   CO2 33 (H) 09/19/2022

## 2022-09-21 NOTE — Assessment & Plan Note (Signed)
Aortic atherosclerosis :  she has switched from  pravastaitn to rosuvastatin

## 2022-09-21 NOTE — Assessment & Plan Note (Signed)
IgG kappa MGUS.  Multiple myeloma panel showed M protein of 0.1, immunofixation showed IgG monoclonal protein with kappa light chain. [Previously specificity. a biclonal IgG protein with kappa specificity] Light chain ratio 9.06  previous 24-hour urine protein electrophoresis did not detect any M spike. Recommend monitoring repeat blood work every 6 months.

## 2022-09-21 NOTE — Assessment & Plan Note (Signed)
Reviewed recent workup by  Nephrology.  Reviewed need to avoid NSAIDS, which she is doing.  She has increased her water intake to 50-60 ounces daily   Lab Results  Component Value Date   CREATININE 0.96 09/19/2022

## 2022-09-21 NOTE — Assessment & Plan Note (Signed)
She is taking a high  potency statin and LDL is <70.  No changes today. LFTs are normal;  Lab Results  Component Value Date   CHOL 112 09/19/2022   HDL 46.50 09/19/2022   LDLCALC 43 09/19/2022   LDLDIRECT 50.0 09/19/2022   TRIG 113.0 09/19/2022   CHOLHDL 2 09/19/2022   Lab Results  Component Value Date   ALT 17 09/19/2022   AST 20 09/19/2022   ALKPHOS 56 09/19/2022   BILITOT 0.8 09/19/2022

## 2022-10-09 ENCOUNTER — Ambulatory Visit
Admission: RE | Admit: 2022-10-09 | Discharge: 2022-10-09 | Disposition: A | Discharge status: Home / Self Care | Payer: Medicare Other | Source: Ambulatory Visit | Attending: Internal Medicine | Admitting: Internal Medicine

## 2022-10-09 DIAGNOSIS — M8580 Other specified disorders of bone density and structure, unspecified site: Secondary | ICD-10-CM | POA: Insufficient documentation

## 2022-10-09 DIAGNOSIS — M8589 Other specified disorders of bone density and structure, multiple sites: Secondary | ICD-10-CM | POA: Diagnosis not present

## 2022-10-09 DIAGNOSIS — Z780 Asymptomatic menopausal state: Secondary | ICD-10-CM | POA: Diagnosis not present

## 2022-10-30 DIAGNOSIS — Z23 Encounter for immunization: Secondary | ICD-10-CM | POA: Diagnosis not present

## 2022-12-09 DIAGNOSIS — Z961 Presence of intraocular lens: Secondary | ICD-10-CM | POA: Diagnosis not present

## 2022-12-09 DIAGNOSIS — H2511 Age-related nuclear cataract, right eye: Secondary | ICD-10-CM | POA: Diagnosis not present

## 2023-01-13 ENCOUNTER — Inpatient Hospital Stay: Payer: Medicare Other | Attending: Oncology

## 2023-01-13 DIAGNOSIS — N189 Chronic kidney disease, unspecified: Secondary | ICD-10-CM | POA: Diagnosis not present

## 2023-01-13 DIAGNOSIS — D472 Monoclonal gammopathy: Secondary | ICD-10-CM | POA: Diagnosis not present

## 2023-01-13 DIAGNOSIS — D696 Thrombocytopenia, unspecified: Secondary | ICD-10-CM | POA: Diagnosis not present

## 2023-01-13 DIAGNOSIS — Z806 Family history of leukemia: Secondary | ICD-10-CM | POA: Diagnosis not present

## 2023-01-13 LAB — CBC WITH DIFFERENTIAL (CANCER CENTER ONLY)
Abs Immature Granulocytes: 0.01 10*3/uL (ref 0.00–0.07)
Basophils Absolute: 0 10*3/uL (ref 0.0–0.1)
Basophils Relative: 0 %
Eosinophils Absolute: 0 10*3/uL (ref 0.0–0.5)
Eosinophils Relative: 1 %
HCT: 38.6 % (ref 36.0–46.0)
Hemoglobin: 13 g/dL (ref 12.0–15.0)
Immature Granulocytes: 0 %
Lymphocytes Relative: 24 %
Lymphs Abs: 1 10*3/uL (ref 0.7–4.0)
MCH: 34.2 pg — ABNORMAL HIGH (ref 26.0–34.0)
MCHC: 33.7 g/dL (ref 30.0–36.0)
MCV: 101.6 fL — ABNORMAL HIGH (ref 80.0–100.0)
Monocytes Absolute: 0.2 10*3/uL (ref 0.1–1.0)
Monocytes Relative: 5 %
Neutro Abs: 2.9 10*3/uL (ref 1.7–7.7)
Neutrophils Relative %: 70 %
Platelet Count: 121 10*3/uL — ABNORMAL LOW (ref 150–400)
RBC: 3.8 MIL/uL — ABNORMAL LOW (ref 3.87–5.11)
RDW: 11.8 % (ref 11.5–15.5)
WBC Count: 4.2 10*3/uL (ref 4.0–10.5)
nRBC: 0 % (ref 0.0–0.2)

## 2023-01-13 LAB — CMP (CANCER CENTER ONLY)
ALT: 18 U/L (ref 0–44)
AST: 20 U/L (ref 15–41)
Albumin: 4 g/dL (ref 3.5–5.0)
Alkaline Phosphatase: 51 U/L (ref 38–126)
Anion gap: 8 (ref 5–15)
BUN: 24 mg/dL — ABNORMAL HIGH (ref 8–23)
CO2: 30 mmol/L (ref 22–32)
Calcium: 9.2 mg/dL (ref 8.9–10.3)
Chloride: 102 mmol/L (ref 98–111)
Creatinine: 0.97 mg/dL (ref 0.44–1.00)
GFR, Estimated: 57 mL/min — ABNORMAL LOW (ref >60)
Glucose, Bld: 99 mg/dL (ref 70–99)
Potassium: 4.6 mmol/L (ref 3.5–5.1)
Sodium: 140 mmol/L (ref 135–145)
Total Bilirubin: 0.8 mg/dL (ref <1.2)
Total Protein: 6.1 g/dL — ABNORMAL LOW (ref 6.5–8.1)

## 2023-01-13 LAB — TECHNOLOGIST SMEAR REVIEW
Plt Morphology: NORMAL
RBC MORPHOLOGY: NORMAL
WBC MORPHOLOGY: NORMAL

## 2023-01-13 LAB — IMMATURE PLATELET FRACTION: Immature Platelet Fraction: 2.5 % (ref 1.2–8.6)

## 2023-01-14 LAB — KAPPA/LAMBDA LIGHT CHAINS
Kappa free light chain: 107.8 mg/L — ABNORMAL HIGH (ref 3.3–19.4)
Kappa, lambda light chain ratio: 10.78 — ABNORMAL HIGH (ref 0.26–1.65)
Lambda free light chains: 10 mg/L (ref 5.7–26.3)

## 2023-01-17 ENCOUNTER — Other Ambulatory Visit: Payer: Self-pay

## 2023-01-17 DIAGNOSIS — D472 Monoclonal gammopathy: Secondary | ICD-10-CM

## 2023-01-17 DIAGNOSIS — D696 Thrombocytopenia, unspecified: Secondary | ICD-10-CM | POA: Diagnosis not present

## 2023-01-17 DIAGNOSIS — N189 Chronic kidney disease, unspecified: Secondary | ICD-10-CM | POA: Diagnosis not present

## 2023-01-17 DIAGNOSIS — Z806 Family history of leukemia: Secondary | ICD-10-CM | POA: Diagnosis not present

## 2023-01-22 LAB — MULTIPLE MYELOMA PANEL, SERUM
Albumin SerPl Elph-Mcnc: 3.5 g/dL (ref 2.9–4.4)
Albumin/Glob SerPl: 1.9 — ABNORMAL HIGH (ref 0.7–1.7)
Alpha 1: 0.2 g/dL (ref 0.0–0.4)
Alpha2 Glob SerPl Elph-Mcnc: 0.5 g/dL (ref 0.4–1.0)
B-Globulin SerPl Elph-Mcnc: 0.7 g/dL (ref 0.7–1.3)
Gamma Glob SerPl Elph-Mcnc: 0.5 g/dL (ref 0.4–1.8)
Globulin, Total: 1.9 g/dL — ABNORMAL LOW (ref 2.2–3.9)
IgA: 42 mg/dL — ABNORMAL LOW (ref 64–422)
IgG (Immunoglobin G), Serum: 445 mg/dL — ABNORMAL LOW (ref 586–1602)
IgM (Immunoglobulin M), Srm: 84 mg/dL (ref 26–217)
M Protein SerPl Elph-Mcnc: 0.1 g/dL — ABNORMAL HIGH
Total Protein ELP: 5.4 g/dL — ABNORMAL LOW (ref 6.0–8.5)

## 2023-01-27 ENCOUNTER — Inpatient Hospital Stay (HOSPITAL_BASED_OUTPATIENT_CLINIC_OR_DEPARTMENT_OTHER): Payer: Medicare Other | Admitting: Oncology

## 2023-01-27 ENCOUNTER — Encounter: Payer: Self-pay | Admitting: Oncology

## 2023-01-27 VITALS — BP 133/67 | HR 71 | Temp 97.6°F | Resp 18 | Wt 138.0 lb

## 2023-01-27 DIAGNOSIS — N189 Chronic kidney disease, unspecified: Secondary | ICD-10-CM | POA: Diagnosis not present

## 2023-01-27 DIAGNOSIS — Z806 Family history of leukemia: Secondary | ICD-10-CM | POA: Diagnosis not present

## 2023-01-27 DIAGNOSIS — D472 Monoclonal gammopathy: Secondary | ICD-10-CM | POA: Diagnosis not present

## 2023-01-27 DIAGNOSIS — D696 Thrombocytopenia, unspecified: Secondary | ICD-10-CM

## 2023-01-27 NOTE — Progress Notes (Signed)
Hematology/Oncology Progress note [phone] [phone]            Patient Care Team: Sherlene Shams, MD as PCP - General (Internal Medicine) Van Clines, MD as Consulting Physician (Neurology) Rickard Patience, MD as Consulting Physician (Oncology)  ASSESSMENT & PLAN:   MGUS (monoclonal gammopathy of unknown significance) #.  IgG kappa MGUS Labs reviewed and discussed with Multiple myeloma panel showed M protein of 0.1, immunofixation showed IgG monoclonal protein with kappa light chain. [Previously specificity. a biclonal IgG protein with kappa specificity] Lab Results  Component Value Date   MPROTEIN 0.1 (H) 01/13/2023   KPAFRELGTCHN 107.8 (H) 01/13/2023   LAMBDASER 10.0 01/13/2023   KAPLAMBRATIO 10.78 (H) 01/13/2023    Overall stable. Recommend monitoring repeat blood work every 6 months.  Thrombocytopenia (HCC) Previous workup includes normal vitamin B12, normal folate, negative HIV, negative hepatitis panel, negative ANA, normal immature platelet fraction.  Peripheral blood flow cytometry is negative for significant immunophenotypic abnormality.  Labs reviewed and discussed with patient. Stable counts.  Continue to monitor.   Orders Placed This Encounter  Procedures   CBC with Differential (Cancer Center Only)    Standing Status:   Future    Expected Date:   07/28/2023    Expiration Date:   01/27/2024   CMP (Cancer Center only)    Standing Status:   Future    Expected Date:   07/28/2023    Expiration Date:   01/27/2024   Kappa/lambda light chains    Standing Status:   Future    Expected Date:   07/28/2023    Expiration Date:   01/27/2024   Multiple Myeloma Panel (SPEP&IFE w/QIG)    Standing Status:   Future    Expected Date:   07/28/2023    Expiration Date:   01/27/2024   Vitamin B12    Standing Status:   Future    Expected Date:   07/28/2023    Expiration Date:   01/27/2024    Follow up in 6 months.  All questions were answered. The  patient knows to call the clinic with any problems, questions or concerns.  Rickard Patience, MD, PhD [facility] Hematology Oncology 01/27/2023   CHIEF COMPLAINTS/REASON FOR VISIT:  MGUS  HISTORY OF PRESENTING ILLNESS:   Tina Silva is a  88 y.o.  female presents for MGUS  Patient has CKD, follows up with nephrology.  Work up showed  02/21/2021, free light chain ratio 10.4  dad ? MM, m grandmother MM.  03/29/2021, free light chain ratio 11.12 Random urine protein electrophoresis showed faint albumin band detected on urine protein electrophoresis.  No abnormal Bence Jones proteinuria. 04/05/2021, serum protein electrophoresis showed a 2 abnormal protein bands detected in the gamma globulin.  She has chronic history of thrombocytopenia, recent cbc showed platelet count of 111, which is slightly lower than her baseline.  Denies any easy bruising or active bleeding events.  Denies any alcohol use   INTERVAL HISTORY Tina Silva is a 88 y.o. female who has above history reviewed by me today presents for follow up visit for MGUS.  Patient presents to review lab results.  No new complaints.  Denies any recent infections, vaccinations.   Review of Systems  Constitutional:  Positive for fatigue. Negative for appetite change, chills and fever.  HENT:   Negative for hearing loss and voice change.   Eyes:  Negative for eye problems.  Respiratory:  Negative for chest tightness and cough.   Cardiovascular:  Negative for chest pain.  Gastrointestinal:  Negative for abdominal distention, abdominal pain and blood in stool.  Endocrine: Negative for hot flashes.  Genitourinary:  Negative for difficulty urinating and frequency.   Musculoskeletal:  Negative for arthralgias.  Skin:  Negative for itching and rash.  Neurological:  Negative for extremity weakness.  Hematological:  Negative for adenopathy.  Psychiatric/Behavioral:  Negative for confusion.     MEDICAL HISTORY:  Past Medical History:   Diagnosis Date   Hyperlipidemia    Hypertension    Hypothyroidism    Multiple myeloma (HCC)    Osteoporosis    Vertigo 07/28/2016    SURGICAL HISTORY: Past Surgical History:  Procedure Laterality Date   ABDOMINAL HYSTERECTOMY     APPENDECTOMY  1974   BREAST BIOPSY Left 11/04/2005   core   COLONOSCOPY WITH PROPOFOL N/A 02/12/2016   Procedure: COLONOSCOPY WITH PROPOFOL;  Surgeon: Scot Jun, MD;  Location: [facility] ENDOSCOPY;  Service: Endoscopy;  Laterality: N/A;   EYE SURGERY  june 2013   cataract with lens,  dinglein left eye    TONSILLECTOMY     TONSILLECTOMY AND ADENOIDECTOMY      SOCIAL HISTORY: Social History   Socioeconomic History   Marital status: Single    Spouse name: Not on file   Number of children: Not on file   Years of education: Not on file   Highest education level: Bachelor's degree (e.g., BA, AB, BS)  Occupational History   Not on file  Tobacco Use   Smoking status: Never   Smokeless tobacco: Never  Vaping Use   Vaping status: Never Used  Substance and Sexual Activity   Alcohol use: No   Drug use: No   Sexual activity: Never  Other Topics Concern   Not on file  Social History Narrative   Independent at baseline   Left handed    Social Drivers of Health   Financial Resource Strain: Low Risk  (09/15/2022)   Overall Financial Resource Strain (CARDIA)    Difficulty of Paying Living Expenses: Not hard at all  Food Insecurity: No Food Insecurity (09/15/2022)   Hunger Vital Sign    Worried About Running Out of Food in the Last Year: Never true    Ran Out of Food in the Last Year: Never true  Transportation Needs: No Transportation Needs (09/15/2022)   PRAPARE - Administrator, Civil Service (Medical): No    Lack of Transportation (Non-Medical): No  Physical Activity: Sufficiently Active (09/15/2022)   Exercise Vital Sign    Days of Exercise per Week: 5 days    Minutes of Exercise per Session: 30 min  Stress: No Stress Concern  Present (09/15/2022)   [employer] of Occupational Health - Occupational Stress Questionnaire    Feeling of Stress : Not at all  Social Connections: Moderately Integrated (09/15/2022)   Social Connection and Isolation Panel [NHANES]    Frequency of Communication with Friends and Family: More than three times a week    Frequency of Social Gatherings with Friends and Family: More than three times a week    Attends Religious Services: More than 4 times per year    Active Member of [employer] or Organizations: Yes    Attends Banker Meetings: More than 4 times per year    Marital Status: Never married  Intimate Partner Violence: Not At Risk (04/12/2022)   Humiliation, Afraid, Rape, and Kick questionnaire    Fear of Current or Ex-Partner: No  Emotionally Abused: No    Physically Abused: No    Sexually Abused: No    FAMILY HISTORY: Family History  Problem Relation Age of Onset   Parkinson's disease Mother    Cancer Sister        AML   Diabetes Sister    Heart disease Father    Diabetes Paternal Aunt    Cancer Maternal Grandmother    Breast cancer Neg Hx     ALLERGIES:  is allergic to lidocaine-epinephrine (pf), ciprofloxacin, doxycycline, flagyl [metronidazole], iodine, lidocaine, and sudafed [pseudoephedrine hcl].  MEDICATIONS:  Current Outpatient Medications  Medication Sig Dispense Refill   aspirin EC 81 MG tablet Take 81 mg by mouth daily. Swallow whole.     Calcium Carbonate-Vit D-Min (CALTRATE PLUS PO) Take 1,200 mg by mouth daily.     Cholecalciferol (VITAMIN D3) 1000 units CAPS Take 1 capsule by mouth.     losartan (COZAAR) 25 MG tablet      metoprolol succinate (TOPROL-XL) 25 MG 24 hr tablet TAKE 1/2 TABLET TWICE A DAY 90 tablet 1   Multiple Vitamin (MULTIVITAMIN) tablet Take 1 tablet by mouth daily.     rosuvastatin (CRESTOR) 10 MG tablet Take 1 tablet (10 mg total) by mouth daily. 90 tablet 3   No current facility-administered medications for this visit.      PHYSICAL EXAMINATION: ECOG PERFORMANCE STATUS: 1 - Symptomatic but completely ambulatory Vitals:   01/27/23 0948  BP: 133/67  Pulse: 71  Resp: 18  Temp: 97.6 F (36.4 C)  SpO2: 100%   Filed Weights   01/27/23 0948  Weight: 138 lb (62.6 kg)    Physical Exam Constitutional:      General: She is not in acute distress. HENT:     Head: Normocephalic and atraumatic.  Eyes:     General: No scleral icterus. Cardiovascular:     Rate and Rhythm: Normal rate and regular rhythm.     Heart sounds: Normal heart sounds.  Pulmonary:     Effort: Pulmonary effort is normal. No respiratory distress.     Breath sounds: No wheezing.  Abdominal:     General: Bowel sounds are normal. There is no distension.     Palpations: Abdomen is soft.  Musculoskeletal:        General: No deformity. Normal range of motion.     Cervical back: Normal range of motion and neck supple.  Skin:    General: Skin is warm and dry.     Findings: No erythema or rash.  Neurological:     Mental Status: She is alert and oriented to person, place, and time. Mental status is at baseline.     Cranial Nerves: No cranial nerve deficit.     Coordination: Coordination normal.  Psychiatric:        Mood and Affect: Mood normal.     LABORATORY DATA:  I have reviewed the data as listed    Latest Ref Rng & Units 01/13/2023    9:33 AM 07/05/2022   10:26 AM 01/31/2022   10:39 AM  CBC  WBC 4.0 - 10.5 K/uL 4.2  4.5  4.6   Hemoglobin 12.0 - 15.0 g/dL 32.4  40.1  02.7   Hematocrit 36.0 - 46.0 % 38.6  39.7  39.3   Platelets 150 - 400 K/uL 121  123  131       Latest Ref Rng & Units 01/13/2023    9:33 AM 09/19/2022   10:11 AM 07/05/2022   10:26 AM  CMP  Glucose 70 - 99 mg/dL 99  97  119   BUN 8 - 23 mg/dL 24  20  22    Creatinine 0.44 - 1.00 mg/dL 1.47  8.29  5.62   Sodium 135 - 145 mmol/L 140  140  143   Potassium 3.5 - 5.1 mmol/L 4.6  4.5  4.4   Chloride 98 - 111 mmol/L 102  102  106   CO2 22 - 32 mmol/L 30  33   29   Calcium 8.9 - 10.3 mg/dL 9.2  9.2  9.4   Total Protein 6.5 - 8.1 g/dL 6.1  5.9  6.1   Total Bilirubin <1.2 mg/dL 0.8  0.8  0.7   Alkaline Phos 38 - 126 U/L 51  56  53   AST 15 - 41 U/L 20  20  21    ALT 0 - 44 U/L 18  17  17      RADIOGRAPHIC STUDIES: I have personally reviewed the radiological images as listed and agreed with the findings in the report. No results found.

## 2023-01-27 NOTE — Assessment & Plan Note (Signed)
Previous workup includes normal vitamin B12, normal folate, negative HIV, negative hepatitis panel, negative ANA, normal immature platelet fraction.  Peripheral blood flow cytometry is negative for significant immunophenotypic abnormality.  Labs reviewed and discussed with patient. Stable counts.  Continue to monitor.

## 2023-01-27 NOTE — Assessment & Plan Note (Addendum)
#.    IgG kappa MGUS Labs reviewed and discussed with Multiple myeloma panel showed M protein of 0.1, immunofixation showed IgG monoclonal protein with kappa light chain. [Previously specificity. a biclonal IgG protein with kappa specificity] Lab Results  Component Value Date   MPROTEIN 0.1 (H) 01/13/2023   KPAFRELGTCHN 107.8 (H) 01/13/2023   LAMBDASER 10.0 01/13/2023   KAPLAMBRATIO 10.78 (H) 01/13/2023    Overall stable. Recommend monitoring repeat blood work every 6 months.

## 2023-01-28 LAB — IFE+PROTEIN ELECTRO, 24-HR UR
% BETA, Urine: 20.2 %
ALPHA 1 URINE: 11.9 %
Albumin, U: 31.8 %
Alpha 2, Urine: 19.1 %
GAMMA GLOBULIN URINE: 17 %
M-SPIKE %, Urine: 7.9 % — ABNORMAL HIGH
M-Spike, Mg/24 Hr: <6 mg/(24.h) — ABNORMAL HIGH
Total Protein, Urine-Ur/day: <80 mg/(24.h) (ref 30–150)
Total Protein, Urine: <4 mg/dL
Total Volume: 2000

## 2023-02-11 ENCOUNTER — Other Ambulatory Visit: Payer: Self-pay | Admitting: Oncology

## 2023-02-11 DIAGNOSIS — D472 Monoclonal gammopathy: Secondary | ICD-10-CM

## 2023-02-12 ENCOUNTER — Telehealth: Payer: Self-pay

## 2023-02-12 NOTE — Telephone Encounter (Signed)
 Called patient and informed her that there is some increase of M protein in her urine but there is no need for treatment at this time. I informed her that Dr. Wilhelmenia Harada will repeat 24 hour UPEP at next visit. Patient gave verbal understanding to this.

## 2023-02-12 NOTE — Telephone Encounter (Signed)
-----   Message from Timmy Forbes sent at 02/11/2023  9:34 PM EST ----- Please let her know that there is some increase of M protein in her urine. No need for treatment. I will repeat 24 hour UPEP at next visit. Ordered.

## 2023-02-28 ENCOUNTER — Other Ambulatory Visit: Payer: Self-pay | Admitting: Internal Medicine

## 2023-03-24 ENCOUNTER — Ambulatory Visit: Payer: Medicare Other | Admitting: Internal Medicine

## 2023-03-24 ENCOUNTER — Ambulatory Visit (INDEPENDENT_AMBULATORY_CARE_PROVIDER_SITE_OTHER): Payer: Medicare Other | Admitting: Internal Medicine

## 2023-03-24 ENCOUNTER — Encounter: Payer: Self-pay | Admitting: Internal Medicine

## 2023-03-24 VITALS — BP 130/62 | HR 67 | Ht 63.0 in | Wt 136.4 lb

## 2023-03-24 DIAGNOSIS — D696 Thrombocytopenia, unspecified: Secondary | ICD-10-CM

## 2023-03-24 DIAGNOSIS — R42 Dizziness and giddiness: Secondary | ICD-10-CM

## 2023-03-24 DIAGNOSIS — R5383 Other fatigue: Secondary | ICD-10-CM

## 2023-03-24 DIAGNOSIS — R7301 Impaired fasting glucose: Secondary | ICD-10-CM

## 2023-03-24 DIAGNOSIS — E785 Hyperlipidemia, unspecified: Secondary | ICD-10-CM

## 2023-03-24 DIAGNOSIS — M810 Age-related osteoporosis without current pathological fracture: Secondary | ICD-10-CM

## 2023-03-24 DIAGNOSIS — I10 Essential (primary) hypertension: Secondary | ICD-10-CM

## 2023-03-24 DIAGNOSIS — I700 Atherosclerosis of aorta: Secondary | ICD-10-CM | POA: Diagnosis not present

## 2023-03-24 NOTE — Assessment & Plan Note (Signed)
 Intermittent by history.   Reassuring neurologic exam today.  She is compliant with antihypertensives, aspirin, statin.  LDL and blood pressure adequately controlled.

## 2023-03-24 NOTE — Progress Notes (Unsigned)
 Subjective:  Patient ID: Tina Silva, female    DOB: 05-28-1934  Age: 89 y.o. MRN: [id number]  CC: The primary encounter diagnosis was Primary hypertension. Diagnoses of Hyperlipidemia with target LDL less than 100, Impaired fasting glucose, and Other fatigue were also pertinent to this visit.   HPI Tina Silva presents for  Chief Complaint  Patient presents with   Medical Management of Chronic Issues    6 month follow up    1) HTN:   taking metoprolol and losartan  at the lowest possible doses as prescribed and notes no adverse effects.  Home BP readings have been done about once per week and are  generally < 130/80 .  She is avoiding added salt in her diet and walking regularly about 3 times per week for exercise .  She  Checked it once when  she was dizzy,  BP was 130/80  and symptoms resolved after 24 hours .  She is walking for exercise  notes some pedal edemal with prolonged sitting.    2)  MGUS:  stable labs by oncology follow up with Dr Liliane Channel ib dec 2024.  Wearing  an N 95 mask in public places   3) thrombocytopenia; negative workup .  Checked every 6 minths   4) CKD:  stable. Last GFR 63 ml/min. Seeing [facility]. Labs from June and December rviewed   5) BJ:YNWGNFAOZ August  ;  DEXA osteopenia Sept 2024  had osteoporosis treated > 10 yrs with bisphosphonates. Declines Prolia   Sleeping better on a full belly,   one nighttime void, 2 if in bed by 7 pm.  No constipation.   Lab Results  Component Value Date   CREATININE 0.97 01/13/2023     Outpatient Medications Prior to Visit  Medication Sig Dispense Refill   aspirin EC 81 MG tablet Take 81 mg by mouth daily. Swallow whole.     Calcium Carbonate-Vit D-Min (CALTRATE PLUS PO) Take 1,200 mg by mouth daily.     Cholecalciferol (VITAMIN D3) 1000 units CAPS Take 1 capsule by mouth.     losartan (COZAAR) 25 MG tablet      metoprolol succinate (TOPROL-XL) 25 MG 24 hr tablet TAKE ONE-HALF TABLET TWICE DAILY 90 tablet 1    Multiple Vitamin (MULTIVITAMIN) tablet Take 1 tablet by mouth daily.     rosuvastatin (CRESTOR) 10 MG tablet Take 1 tablet (10 mg total) by mouth daily. 90 tablet 3   No facility-administered medications prior to visit.    Review of Systems;  Patient denies headache, fevers, malaise, unintentional weight loss, skin rash, eye pain, sinus congestion and sinus pain, sore throat, dysphagia,  hemoptysis , cough, dyspnea, wheezing, chest pain, palpitations, orthopnea, edema, abdominal pain, nausea, melena, diarrhea, constipation, flank pain, dysuria, hematuria, urinary  Frequency, nocturia, numbness, tingling, seizures,  Focal weakness, Loss of consciousness,  Tremor, insomnia, depression, anxiety, and suicidal ideation.      Objective:  BP 130/62   Pulse 67   Ht 5\' 3"  (1.6 m)   Wt 136 lb 6.4 oz (61.9 kg)   SpO2 97%   BMI 24.16 kg/m   BP Readings from Last 3 Encounters:  03/24/23 130/62  01/27/23 133/67  09/19/22 106/64    Wt Readings from Last 3 Encounters:  03/24/23 136 lb 6.4 oz (61.9 kg)  01/27/23 138 lb (62.6 kg)  09/19/22 133 lb 9.6 oz (60.6 kg)    Physical Exam Vitals reviewed.  Constitutional:      General: She is  not in acute distress.    Appearance: Normal appearance. She is normal weight. She is not ill-appearing, toxic-appearing or diaphoretic.  HENT:     Head: Normocephalic.  Eyes:     General: No scleral icterus.       Right eye: No discharge.        Left eye: No discharge.     Conjunctiva/sclera: Conjunctivae normal.  Cardiovascular:     Rate and Rhythm: Normal rate and regular rhythm.     Heart sounds: Normal heart sounds.  Pulmonary:     Effort: Pulmonary effort is normal. No respiratory distress.     Breath sounds: Normal breath sounds.  Musculoskeletal:        General: Normal range of motion.  Skin:    General: Skin is warm and dry.  Neurological:     General: No focal deficit present.     Mental Status: She is alert and oriented to person, place,  and time. Mental status is at baseline.  Psychiatric:        Mood and Affect: Mood normal.        Behavior: Behavior normal.        Thought Content: Thought content normal.        Judgment: Judgment normal.   Lab Results  Component Value Date   HGBA1C 5.1 07/16/2021   HGBA1C 5.2 07/03/2017   HGBA1C 5.6 01/10/2012    Lab Results  Component Value Date   CREATININE 0.97 01/13/2023   CREATININE 0.96 09/19/2022   CREATININE 0.90 07/05/2022    Lab Results  Component Value Date   WBC 4.2 01/13/2023   HGB 13.0 01/13/2023   HCT 38.6 01/13/2023   PLT 121 (L) 01/13/2023   GLUCOSE 99 01/13/2023   CHOL 112 09/19/2022   TRIG 113.0 09/19/2022   HDL 46.50 09/19/2022   LDLDIRECT 50.0 09/19/2022   LDLCALC 43 09/19/2022   ALT 18 01/13/2023   AST 20 01/13/2023   NA 140 01/13/2023   K 4.6 01/13/2023   CL 102 01/13/2023   CREATININE 0.97 01/13/2023   BUN 24 (H) 01/13/2023   CO2 30 01/13/2023   TSH 2.59 07/16/2021   HGBA1C 5.1 07/16/2021   MICROALBUR <0.7 02/08/2021    DG Bone Density Result Date: 10/09/2022 EXAM: DUAL X-RAY ABSORPTIOMETRY (DXA) FOR BONE MINERAL DENSITY IMPRESSION: Your patient Tina Silva completed a BMD test on 10/09/2022 using the [employer] iDXA DXA System (software version: 14.10) manufactured by [employer]. The following summarizes the results of our evaluation. Technologist:VLM PATIENT BIOGRAPHICAL: Name: Tina Silva Patient ID: [id number] Birth Date: [birth year] Height: 63.0 in. Gender: Female Exam Date: 10/09/2022 Weight: 133.6 lbs. Indications: Caucasian, Family Hx of Osteoporosis, History of Fracture (Adult), Osteopenia Fractures: Right wrist, Wrist Right Treatments: calcium w/ vit D, oscal DENSITOMETRY RESULTS: Site         Region     Measured Date Measured Age WHO Classification Young Adult T-score BMD         %Change vs. Previous Significant Change (*) AP Spine L1-L2 10/09/2024 85.7 Osteopenia -1.6 0.978 g/cm2 7.2% Yes AP Spine L1-L2 02/04/2018 81.1  Osteopenia -2.2 0.912 g/cm2 -6.7% Yes AP Spine L1-L2 09/06/2013 76.6 Osteopenia -1.6 0.977 g/cm2 - - DualFemur Neck Left 10/09/2022 85.7 Osteopenia -2.3 0.721 g/cm2 -3.1% - DualFemur Neck Left 02/04/2018 81.1 Osteopenia -2.1 0.744 g/cm2 -0.5% - DualFemur Neck Left 09/06/2013 76.6 Osteopenia -2.1 0.748 g/cm2 - - DualFemur Total Mean 10/09/2022 85.7 Osteopenia -1.9 0.774 g/cm2 -5.5% Yes DualFemur Total Mean 02/04/2018  81.1 Osteopenia -1.5 0.819 g/cm2 0.0% - DualFemur Total Mean 09/06/2013 76.6 Osteopenia -1.5 0.819 g/cm2 - - Left Forearm Radius 33% 10/09/2022 85.7 Normal -0.3 0.846 g/cm2 4.1% - Left Forearm Radius 33% 02/04/2018 81.1 Normal -0.7 0.813 g/cm2 -8.0% Yes Left Forearm Radius 33% 09/06/2013 76.6 Normal 0.1 0.884 g/cm2 - - ASSESSMENT: The BMD measured at Femur Neck Left is 0.721 g/cm2 with a T-score of -2.3. This patient is considered osteopenic according to World Health Organization [facility]) criteria. L-3 & L-4 was excluded due to degenerative changes. Compared with prior study, there has been significant increase in the spine. Compared with prior study, there has been significant decrease in the total hip. The scan quality is good. World Science writer [facility]) criteria for post-menopausal, Caucasian Women: Normal:                   T-score at or above -1 SD Osteopenia/low bone mass: T-score between -1 and -2.5 SD Osteoporosis:             T-score at or below -2.5 SD RECOMMENDATIONS: 1. All patients should optimize calcium and vitamin D intake. 2. Consider FDA-approved medical therapies in postmenopausal women and men aged 81 years and older, based on the following: a. A hip or vertebral(clinical or morphometric) fracture b. T-score < -2.5 at the femoral neck or spine after appropriate evaluation to exclude secondary causes c. Low bone mass (T-score between -1.0 and -2.5 at the femoral neck or spine) and a 10-year probability of a hip fracture > 3% or a 10-year probability of a major osteoporosis-related  fracture > 20% based on the [place]-adapted WHO algorithm 3. Clinician judgment and/or patient preferences may indicate treatment for people with 10-year fracture probabilities above or below these levels FOLLOW-UP: People with diagnosed cases of osteoporosis or at high risk for fracture should have regular bone mineral density tests. For patients eligible for Medicare, routine testing is allowed once every 2 years. The testing frequency can be increased to one year for patients who have rapidly progressing disease, those who are receiving or discontinuing medical therapy to restore bone mass, or have additional risk factors. I have reviewed this report, and agree with the above findings. [facility] Dear Sherlene Shams, Your patient Lineth Thielke Malatesta completed a FRAX assessment on 10/09/2022 using the [facility] iDXA DXA System (analysis version: 14.10) manufactured by [employer]. The following summarizes the results of our evaluation. PATIENT BIOGRAPHICAL: Name: Adalida, Garver Patient ID: [id number] Birth Date: 04-Mar-1934 Height:    63.0 in. Gender:     [age]    [age]       Weight:    133.6 lbs. Ethnicity:  White                            Exam Date: 10/09/2022 FRAX* RESULTS:  (version: 3.5) 10-year Probability of Fracture1 Major Osteoporotic Fracture2 Hip Fracture 23.5% 7.6% Population: [place] (Caucasian) Risk Factors: History of Fracture (Adult) Based on Femur (Left) Neck BMD 1 -The 10-year probability of fracture may be lower than reported if the patient has received treatment. 2 -Major Osteoporotic Fracture: Clinical Spine, Forearm, Hip or Shoulder *FRAX is a Armed forces logistics/support/administrative officer of the [employer] of [employer] for Metabolic Bone Disease, a World Science writer (WHO) [employer]. ASSESSMENT: The probability of a major osteoporotic fracture is 23.5% within the next ten years. The probability of a hip fracture  is 7.6% within the next ten years. . Electronically Signed    By: Romona Curls M.D.   On: 10/09/2022 11:18    Assessment & Plan:  .Primary hypertension  Hyperlipidemia with target LDL less than 100  Impaired fasting glucose  Other fatigue     I spent 34 minutes on the day of this face to face encounter reviewing patient's  most recent visit with cardiology,  nephrology,  and neurology,  prior relevant surgical and non surgical procedures, recent  labs and imaging studies, counseling on weight management,  reviewing the assessment and plan with patient, and post visit ordering and reviewing of  diagnostics and therapeutics with patient  .   Follow-up: No follow-ups on file.   Sherlene Shams, MD

## 2023-03-24 NOTE — Assessment & Plan Note (Signed)
 Well controlled on current regimen of low dose metoprolol and losartan.  Review of June and December labs note stable /improving . Renal function.  no changes today.

## 2023-03-24 NOTE — Assessment & Plan Note (Signed)
 Reviewed prior film notin Aortic atherosclerosis :  she is tolerating the switch  from  pravastaitn to rosuvastatin

## 2023-03-24 NOTE — Assessment & Plan Note (Signed)
 Resolved with treatment  last DEXA noted osteopenia,  has been off treatment since 2021 and declines Prolia, which would be the only available option given her h/o TIA,  CKD/MGUS

## 2023-03-24 NOTE — Assessment & Plan Note (Signed)
 Stable with no values  100K

## 2023-03-24 NOTE — Patient Instructions (Signed)
 You don't need genetic testing at your age!   Continue your current medications and make sure you are getting some exercise every day  The swelling in your feet is from sitting too long without using your calf muscles.  It is very common.    See you in 6 months

## 2023-04-14 ENCOUNTER — Ambulatory Visit (INDEPENDENT_AMBULATORY_CARE_PROVIDER_SITE_OTHER): Payer: Medicare Other | Admitting: *Deleted

## 2023-04-14 VITALS — BP 118/61 | HR 67 | Ht 63.0 in | Wt 136.0 lb

## 2023-04-14 DIAGNOSIS — Z0000 Encounter for general adult medical examination without abnormal findings: Secondary | ICD-10-CM | POA: Diagnosis not present

## 2023-04-14 NOTE — Patient Instructions (Signed)
 Ms. Vandruff , Thank you for taking time to come for your Medicare Wellness Visit. I appreciate your ongoing commitment to your health goals. Please review the following plan we discussed and let me know if I can assist you in the future.   Referrals/Orders/Follow-Ups/Clinician Recommendations: Consider updating your covid vaccine.  This is a list of the screening recommended for you and due dates:  Health Maintenance  Topic Date Due   COVID-19 Vaccine (6 - 2024-25 season) 09/29/2022   Mammogram  09/02/2023   Medicare Annual Wellness Visit  04/13/2024   DTaP/Tdap/Td vaccine (3 - Td or Tdap) 02/03/2031   Pneumonia Vaccine  Completed   Flu Shot  Completed   DEXA scan (bone density measurement)  Completed   Zoster (Shingles) Vaccine  Completed   HPV Vaccine  Aged Out    Advanced directives: (In Chart) A copy of your advanced directives are scanned into your chart should your provider ever need it.  Next Medicare Annual Wellness Visit scheduled for next year: Yes 04/16/24 @ 8:50

## 2023-04-14 NOTE — Progress Notes (Addendum)
 Subjective:   Tina Silva is a 89 y.o. who presents for a Medicare Wellness preventive visit.  Visit Complete: Virtual I connected with  Kimberly Nieland Medero on 04/14/23 by a audio enabled telemedicine application and verified that I am speaking with the correct person using two identifiers.  Patient Location: Home  Provider Location: Office/Clinic  I discussed the limitations of evaluation and management by telemedicine. The patient expressed understanding and agreed to proceed.  Vital Signs: Because this visit was a virtual/telehealth visit, some criteria may be missing or patient reported. Any vitals not documented were not able to be obtained and vitals that have been documented are patient reported.  VideoDeclined- This patient declined Librarian, academic. Therefore the visit was completed with audio only.  Persons Participating in Visit: Patient.  AWV Questionnaire: Yes: Patient Medicare AWV questionnaire was completed by the patient on 04/10/23; I have confirmed that all information answered by patient is correct and no changes since this date.  Cardiac Risk Factors include: advanced age (>59men, >89 women);hypertension;dyslipidemia     Objective:    Today's Vitals   04/14/23 1016  BP: 118/61  Pulse: 67  SpO2: 97%  Weight: 136 lb (61.7 kg)  Height: 5\' 3"  (1.6 m)   Body mass index is 24.09 kg/m.     04/14/2023   10:24 AM 08/09/2022    9:36 AM 04/12/2022    9:05 AM 01/03/2022   10:17 AM 08/06/2021   11:30 AM 06/21/2021   11:19 AM 05/29/2021   11:05 AM  Advanced Directives  Does Patient Have a Medical Advance Directive? Yes Yes Yes Yes Yes Yes Yes  Type of Estate agent of [place];Living will Healthcare Power of [place];Living will Healthcare Power of [place];Living will Healthcare Power of [place];Living will Healthcare Power of [place];Living will;Out of facility DNR (pink MOST or yellow form)    Does patient want to  make changes to medical advance directive? No - Patient declined  No - Patient declined      Copy of Healthcare Power of Attorney in Chart? Yes - validated most recent copy scanned in chart (See row information)  Yes - validated most recent copy scanned in chart (See row information) No - copy requested       Current Medications (verified) Outpatient Encounter Medications as of 04/14/2023  Medication Sig   aspirin EC 81 MG tablet Take 81 mg by mouth daily. Swallow whole.   Calcium Carbonate-Vit D-Min (CALTRATE PLUS PO) Take 1,200 mg by mouth daily.   Cholecalciferol (VITAMIN D3) 1000 units CAPS Take 1 capsule by mouth.   losartan (COZAAR) 25 MG tablet    metoprolol succinate (TOPROL-XL) 25 MG 24 hr tablet TAKE ONE-HALF TABLET TWICE DAILY   Multiple Vitamin (MULTIVITAMIN) tablet Take 1 tablet by mouth daily.   rosuvastatin (CRESTOR) 10 MG tablet Take 1 tablet (10 mg total) by mouth daily.   No facility-administered encounter medications on file as of 04/14/2023.    Allergies (verified) Lidocaine-epinephrine (pf), Ciprofloxacin, Doxycycline, Flagyl [metronidazole], Iodine, Lidocaine, and Sudafed [pseudoephedrine hcl]   History: Past Medical History:  Diagnosis Date   Hyperlipidemia    Hypertension    Hypothyroidism    Multiple myeloma (HCC)    Osteoporosis    Vertigo 07/28/2016   Past Surgical History:  Procedure Laterality Date   ABDOMINAL HYSTERECTOMY     APPENDECTOMY  1974   BREAST BIOPSY Left 11/04/2005   core   COLONOSCOPY WITH PROPOFOL N/A 02/12/2016   Procedure: COLONOSCOPY  WITH PROPOFOL;  Surgeon: Scot Jun, MD;  Location: [facility] ENDOSCOPY;  Service: Endoscopy;  Laterality: N/A;   EYE SURGERY  june 2013   cataract with lens,  dinglein left eye    TONSILLECTOMY     TONSILLECTOMY AND ADENOIDECTOMY     Family History  Problem Relation Age of Onset   Parkinson's disease Mother    Cancer Sister        AML   Diabetes Sister    Heart disease Father    Diabetes  Paternal Aunt    Cancer Maternal Grandmother    Breast cancer Neg Hx    Social History   Socioeconomic History   Marital status: Single    Spouse name: Not on file   Number of children: Not on file   Years of education: Not on file   Highest education level: Bachelor's degree (e.g., BA, AB, BS)  Occupational History   Not on file  Tobacco Use   Smoking status: Never   Smokeless tobacco: Never  Vaping Use   Vaping status: Never Used  Substance and Sexual Activity   Alcohol use: No   Drug use: No   Sexual activity: Never  Other Topics Concern   Not on file  Social History Narrative   Independent at baseline   Left handed    Social Drivers of Health   Financial Resource Strain: Low Risk  (04/14/2023)   Overall Financial Resource Strain (CARDIA)    Difficulty of Paying Living Expenses: Not hard at all  Food Insecurity: No Food Insecurity (04/14/2023)   Hunger Vital Sign    Worried About Running Out of Food in the Last Year: Never true    Ran Out of Food in the Last Year: Never true  Transportation Needs: No Transportation Needs (04/14/2023)   PRAPARE - Administrator, Civil Service (Medical): No    Lack of Transportation (Non-Medical): No  Physical Activity: Insufficiently Active (04/14/2023)   Exercise Vital Sign    Days of Exercise per Week: 4 days    Minutes of Exercise per Session: 30 min  Stress: No Stress Concern Present (04/14/2023)   [employer] of Occupational Health - Occupational Stress Questionnaire    Feeling of Stress : Not at all  Social Connections: Moderately Integrated (04/14/2023)   Social Connection and Isolation Panel [NHANES]    Frequency of Communication with Friends and Family: More than three times a week    Frequency of Social Gatherings with Friends and Family: More than three times a week    Attends Religious Services: More than 4 times per year    Active Member of [employer] or Organizations: Yes    Attends Hospital doctor: More than 4 times per year    Marital Status: Never married    Tobacco Counseling Counseling given: Not Answered    Clinical Intake:  Pre-visit preparation completed: Yes  Pain : No/denies pain     BMI - recorded: 24.09 Nutritional Status: BMI of 19-24  Normal Nutritional Risks: None Diabetes: No  How often do you need to have someone help you when you read instructions, pamphlets, or other written materials from your doctor or pharmacy?: 1 - Never  Interpreter Needed?: No  Information entered by :: R. Aletha Allebach LPN   Activities of Daily Living     04/10/2023   11:50 AM  In your present state of health, do you have any difficulty performing the following activities:  Hearing? 1  Comment wears  aids  Vision? 0  Difficulty concentrating or making decisions? 0  Walking or climbing stairs? 0  Dressing or bathing? 0  Doing errands, shopping? 0  Preparing Food and eating ? N  Using the Toilet? N  In the past six months, have you accidently leaked urine? N  Do you have problems with loss of bowel control? N  Managing your Medications? N  Managing your Finances? N  Housekeeping or managing your Housekeeping? N    Patient Care Team: Sherlene Shams, MD as PCP - General (Internal Medicine) Van Clines, MD as Consulting Physician (Neurology) Rickard Patience, MD as Consulting Physician (Oncology)  Indicate any recent Medical Services you may have received from other than [facility] providers in the past year (date may be approximate).     Assessment:   This is a routine wellness examination for [place].  Hearing/Vision screen Hearing Screening - Comments:: Wears aids Vision Screening - Comments:: glasses   Goals Addressed             This Visit's Progress    Patient Stated       Wants to stay active and exercise       Depression Screen     04/14/2023   10:20 AM 03/24/2023   11:45 AM 04/12/2022    8:52 AM 01/15/2022   12:58 PM 11/15/2021    8:04 AM  07/16/2021   10:31 AM 04/10/2021    9:06 AM  PHQ 2/9 Scores  PHQ - 2 Score 0 0 0 0 0 0 0  PHQ- 9 Score 0          Fall Risk     04/14/2023   10:18 AM 03/24/2023   11:45 AM 09/19/2022    9:38 AM 08/09/2022    9:36 AM 04/08/2022    1:45 PM  Fall Risk   Falls in the past year? 0 0 0 0 0  Number falls in past yr: 0 0 0 0 0  Injury with Fall? 0 0 0 0 0  Risk for fall due to : No Fall Risks No Fall Risks No Fall Risks    Follow up Falls prevention discussed;Falls evaluation completed Falls evaluation completed Falls evaluation completed Falls evaluation completed Falls evaluation completed;Falls prevention discussed    MEDICARE RISK AT HOME:  Medicare Risk at Home Any stairs in or around the home?: No If so, are there any without handrails?: No Home free of loose throw rugs in walkways, pet beds, electrical cords, etc?: (Patient-Rptd) Yes Adequate lighting in your home to reduce risk of falls?: (Patient-Rptd) Yes Life alert?: (Patient-Rptd) No Use of a cane, walker or w/c?: (Patient-Rptd) No Grab bars in the bathroom?: (Patient-Rptd) No Shower chair or bench in shower?: (Patient-Rptd) Yes Elevated toilet seat or a handicapped toilet?: (Patient-Rptd) No  TIMED UP AND GO:  Was the test performed?  No  Cognitive Function: 6CIT completed    04/01/2017    9:37 AM 03/26/2016    9:28 AM 03/14/2015    8:32 AM  MMSE - Mini Mental State Exam  Orientation to time 5 5 5   Orientation to Place 5 5 5   Registration 3 3 3   Attention/ Calculation 5 5 5   Recall 3 3 3   Language- name 2 objects 2 2 2   Language- repeat 1 1 1   Language- follow 3 step command 3 3 3   Language- read & follow direction 1 1 1   Write a sentence 1 1 1   Copy design 1 1 1  Total score 30 30 30         04/14/2023   10:24 AM 04/12/2022    8:53 AM 04/10/2021    9:34 AM 04/07/2019    9:31 AM 04/03/2018    8:57 AM  6CIT Screen  What Year? 0 points 0 points 0 points 0 points 0 points  What month? 0 points 0 points 0 points 0  points 0 points  What time? 0 points 0 points 0 points 0 points 0 points  Count back from 20 0 points 0 points   0 points  Months in reverse 0 points 0 points 0 points  0 points  Repeat phrase 0 points 0 points   0 points  Total Score 0 points 0 points   0 points    Immunizations Immunization History  Administered Date(s) Administered   Fluad Quad(high Dose 65+) 10/12/2018, 10/15/2019, 11/07/2020, 10/23/2021   Hep A / Hep B 04/18/2021   Hepb-cpg 04/18/2021, 05/21/2021   Influenza Whole 10/22/2011   Influenza, High Dose Seasonal PF 10/18/2016, 10/21/2017, 10/30/2022   Influenza-Unspecified 11/05/2012, 10/11/2013, 10/06/2014, 10/15/2015   Moderna Covid-19 Vaccine Bivalent Booster 85yrs & up 11/21/2020   PFIZER(Purple Top)SARS-COV-2 Vaccination 02/02/2019, 02/23/2019, 11/01/2019, 05/03/2020   Pneumococcal Conjugate-13 10/12/2013   Pneumococcal Polysaccharide-23 07/15/2012, 01/02/2018   Respiratory Syncytial Virus Vaccine,Recomb Aduvanted(Arexvy) 01/16/2022   Tdap 12/30/2011, 02/02/2021   Zoster Recombinant(Shingrix) 10/07/2017, 12/09/2017   Zoster, Live 07/22/2011    Screening Tests Health Maintenance  Topic Date Due   COVID-19 Vaccine (6 - 2024-25 season) 09/29/2022   Medicare Annual Wellness (AWV)  04/12/2023   MAMMOGRAM  09/02/2023   DTaP/Tdap/Td (3 - Td or Tdap) 02/03/2031   Pneumonia Vaccine 58+ Years old  Completed   INFLUENZA VACCINE  Completed   DEXA SCAN  Completed   Zoster Vaccines- Shingrix  Completed   HPV VACCINES  Aged Out    Health Maintenance  Health Maintenance Due  Topic Date Due   COVID-19 Vaccine (6 - 2024-25 season) 09/29/2022   Medicare Annual Wellness (AWV)  04/12/2023   Health Maintenance Items Addressed: Patient is up to date.  Additional Screening:  Vision Screening: Recommended annual ophthalmology exams for early detection of glaucoma and other disorders of the eye. Up to date [facility]  Dental Screening: Recommended annual dental  exams for proper oral hygiene  Community Resource Referral / Chronic Care Management: CRR required this visit?  No   CCM required this visit?  No     Plan:     I have personally reviewed and noted the following in the patient's chart:   Medical and social history Use of alcohol, tobacco or illicit drugs  Current medications and supplements including opioid prescriptions. Patient is not currently taking opioid prescriptions. Functional ability and status Nutritional status Physical activity Advanced directives List of other physicians Hospitalizations, surgeries, and ER visits in previous 12 months Vitals Screenings to include cognitive, depression, and falls Referrals and appointments  In addition, I have reviewed and discussed with patient certain preventive protocols, quality metrics, and best practice recommendations. A written personalized care plan for preventive services as well as general preventive health recommendations were provided to patient.     Sydell Axon, LPN   4/69/6295   After Visit Summary: (MyChart) Due to this being a telephonic visit, the after visit summary with patients personalized plan was offered to patient via MyChart   Notes: See routing comments

## 2023-04-24 DIAGNOSIS — R8290 Unspecified abnormal findings in urine: Secondary | ICD-10-CM | POA: Diagnosis not present

## 2023-04-24 DIAGNOSIS — N1831 Chronic kidney disease, stage 3a: Secondary | ICD-10-CM | POA: Diagnosis not present

## 2023-04-24 DIAGNOSIS — D472 Monoclonal gammopathy: Secondary | ICD-10-CM | POA: Diagnosis not present

## 2023-04-24 DIAGNOSIS — I10 Essential (primary) hypertension: Secondary | ICD-10-CM | POA: Diagnosis not present

## 2023-05-07 DIAGNOSIS — N1831 Chronic kidney disease, stage 3a: Secondary | ICD-10-CM | POA: Diagnosis not present

## 2023-05-07 DIAGNOSIS — D472 Monoclonal gammopathy: Secondary | ICD-10-CM | POA: Diagnosis not present

## 2023-05-07 DIAGNOSIS — I10 Essential (primary) hypertension: Secondary | ICD-10-CM | POA: Diagnosis not present

## 2023-05-23 ENCOUNTER — Other Ambulatory Visit: Payer: Self-pay | Admitting: Internal Medicine

## 2023-07-28 ENCOUNTER — Inpatient Hospital Stay: Payer: Medicare Other | Attending: Oncology

## 2023-07-28 DIAGNOSIS — D472 Monoclonal gammopathy: Secondary | ICD-10-CM | POA: Insufficient documentation

## 2023-07-28 LAB — CMP (CANCER CENTER ONLY)
ALT: 18 U/L (ref 0–44)
AST: 21 U/L (ref 15–41)
Albumin: 4 g/dL (ref 3.5–5.0)
Alkaline Phosphatase: 58 U/L (ref 38–126)
Anion gap: 7 (ref 5–15)
BUN: 22 mg/dL (ref 8–23)
CO2: 28 mmol/L (ref 22–32)
Calcium: 8.8 mg/dL — ABNORMAL LOW (ref 8.9–10.3)
Chloride: 102 mmol/L (ref 98–111)
Creatinine: 0.94 mg/dL (ref 0.44–1.00)
GFR, Estimated: 59 mL/min — ABNORMAL LOW (ref >60)
Glucose, Bld: 97 mg/dL (ref 70–99)
Potassium: 4.4 mmol/L (ref 3.5–5.1)
Sodium: 137 mmol/L (ref 135–145)
Total Bilirubin: 0.9 mg/dL (ref 0.0–1.2)
Total Protein: 6.1 g/dL — ABNORMAL LOW (ref 6.5–8.1)

## 2023-07-28 LAB — CBC WITH DIFFERENTIAL (CANCER CENTER ONLY)
Abs Immature Granulocytes: 0.01 10*3/uL (ref 0.00–0.07)
Basophils Absolute: 0 10*3/uL (ref 0.0–0.1)
Basophils Relative: 0 %
Eosinophils Absolute: 0.1 10*3/uL (ref 0.0–0.5)
Eosinophils Relative: 2 %
HCT: 39.1 % (ref 36.0–46.0)
Hemoglobin: 13.1 g/dL (ref 12.0–15.0)
Immature Granulocytes: 0 %
Lymphocytes Relative: 23 %
Lymphs Abs: 1.1 10*3/uL (ref 0.7–4.0)
MCH: 33.9 pg (ref 26.0–34.0)
MCHC: 33.5 g/dL (ref 30.0–36.0)
MCV: 101.3 fL — ABNORMAL HIGH (ref 80.0–100.0)
Monocytes Absolute: 0.3 10*3/uL (ref 0.1–1.0)
Monocytes Relative: 6 %
Neutro Abs: 3.3 10*3/uL (ref 1.7–7.7)
Neutrophils Relative %: 69 %
Platelet Count: 112 10*3/uL — ABNORMAL LOW (ref 150–400)
RBC: 3.86 MIL/uL — ABNORMAL LOW (ref 3.87–5.11)
RDW: 11.9 % (ref 11.5–15.5)
WBC Count: 4.7 10*3/uL (ref 4.0–10.5)
nRBC: 0 % (ref 0.0–0.2)

## 2023-07-28 LAB — VITAMIN B12: Vitamin B-12: 853 pg/mL (ref 180–914)

## 2023-07-29 LAB — KAPPA/LAMBDA LIGHT CHAINS
Kappa free light chain: 113.4 mg/L — ABNORMAL HIGH (ref 3.3–19.4)
Kappa, lambda light chain ratio: 9.95 — ABNORMAL HIGH (ref 0.26–1.65)
Lambda free light chains: 11.4 mg/L (ref 5.7–26.3)

## 2023-07-30 ENCOUNTER — Other Ambulatory Visit: Payer: Self-pay

## 2023-07-30 DIAGNOSIS — D472 Monoclonal gammopathy: Secondary | ICD-10-CM | POA: Diagnosis not present

## 2023-07-30 DIAGNOSIS — D696 Thrombocytopenia, unspecified: Secondary | ICD-10-CM | POA: Diagnosis not present

## 2023-07-30 DIAGNOSIS — N189 Chronic kidney disease, unspecified: Secondary | ICD-10-CM | POA: Insufficient documentation

## 2023-07-30 LAB — MULTIPLE MYELOMA PANEL, SERUM
Albumin SerPl Elph-Mcnc: 3.8 g/dL (ref 2.9–4.4)
Albumin/Glob SerPl: 2 — ABNORMAL HIGH (ref 0.7–1.7)
Alpha 1: 0.2 g/dL (ref 0.0–0.4)
Alpha2 Glob SerPl Elph-Mcnc: 0.6 g/dL (ref 0.4–1.0)
B-Globulin SerPl Elph-Mcnc: 0.7 g/dL (ref 0.7–1.3)
Gamma Glob SerPl Elph-Mcnc: 0.4 g/dL (ref 0.4–1.8)
Globulin, Total: 2 g/dL — ABNORMAL LOW (ref 2.2–3.9)
IgA: 42 mg/dL — ABNORMAL LOW (ref 64–422)
IgG (Immunoglobin G), Serum: 441 mg/dL — ABNORMAL LOW (ref 586–1602)
IgM (Immunoglobulin M), Srm: 85 mg/dL (ref 26–217)
M Protein SerPl Elph-Mcnc: 0.1 g/dL — ABNORMAL HIGH
Total Protein ELP: 5.8 g/dL — ABNORMAL LOW (ref 6.0–8.5)

## 2023-08-06 LAB — IFE+PROTEIN ELECTRO, 24-HR UR
% BETA, Urine: 0 %
ALPHA 1 URINE: 0 %
Albumin, U: 0 %
Alpha 2, Urine: 0 %
GAMMA GLOBULIN URINE: 0 %
Total Protein, Urine-Ur/day: <80 mg/(24.h) (ref 30–150)
Total Protein, Urine: <4 mg/dL
Total Volume: 2000

## 2023-08-07 ENCOUNTER — Inpatient Hospital Stay: Payer: Medicare Other | Attending: Oncology | Admitting: Oncology

## 2023-08-07 ENCOUNTER — Encounter: Payer: Self-pay | Admitting: Oncology

## 2023-08-07 VITALS — BP 128/69 | Temp 98.1°F | Resp 18 | Wt 136.3 lb

## 2023-08-07 DIAGNOSIS — N189 Chronic kidney disease, unspecified: Secondary | ICD-10-CM | POA: Diagnosis not present

## 2023-08-07 DIAGNOSIS — D696 Thrombocytopenia, unspecified: Secondary | ICD-10-CM | POA: Diagnosis not present

## 2023-08-07 DIAGNOSIS — D472 Monoclonal gammopathy: Secondary | ICD-10-CM | POA: Diagnosis not present

## 2023-08-07 NOTE — Assessment & Plan Note (Addendum)
#.    IgG kappa MGUS Labs reviewed and discussed with Multiple myeloma panel showed M protein of 0.1, immunofixation showed IgG monoclonal protein with kappa light chain. [Previously  a biclonal IgG protein with kappa specificity] Lab Results  Component Value Date   MPROTEIN 0.1 (H) 07/28/2023   KPAFRELGTCHN 113.4 (H) 07/28/2023   LAMBDASER 11.4 07/28/2023   KAPLAMBRATIO 9.95 (H) 07/28/2023    Obtain skeletal survey Overall stable. Recommend monitoring repeat blood work every 6 months.

## 2023-08-07 NOTE — Progress Notes (Signed)
 Hematology/Oncology Progress note [phone] [phone]          Patient Care Team: Marylynn Verneita CROME, MD as PCP - General (Internal Medicine) Georjean Darice HERO, MD as Consulting Physician (Neurology) Babara Call, MD as Consulting Physician (Oncology)  ASSESSMENT & PLAN:   MGUS (monoclonal gammopathy of unknown significance) #.  IgG kappa MGUS Labs reviewed and discussed with Multiple myeloma panel showed M protein of 0.1, immunofixation showed IgG monoclonal protein with kappa light chain. [Previously  a biclonal IgG protein with kappa specificity] Lab Results  Component Value Date   MPROTEIN 0.1 (H) 07/28/2023   KPAFRELGTCHN 113.4 (H) 07/28/2023   LAMBDASER 11.4 07/28/2023   KAPLAMBRATIO 9.95 (H) 07/28/2023    Obtain skeletal survey Overall stable. Recommend monitoring repeat blood work every 6 months.  Thrombocytopenia (HCC) Previous workup includes normal vitamin B12, normal folate, negative HIV, negative hepatitis panel, negative ANA, normal immature platelet fraction.  Peripheral blood flow cytometry is negative for significant immunophenotypic abnormality.  Labs reviewed and discussed with patient. Slightly decreased count.  Continue to monitor.   Orders Placed This Encounter  Procedures   DG Bone Survey Met    Standing Status:   Future    Expected Date:   08/14/2023    Expiration Date:   08/06/2024    Reason for Exam (SYMPTOM  OR DIAGNOSIS REQUIRED):   MGUS    Preferred imaging location?:   [facility]   IFE+PROTEIN ELECTRO, 24-HR UR    Standing Status:   Future    Expected Date:   02/07/2024    Expiration Date:   05/07/2024   CBC with Differential (Cancer Center Only)    Standing Status:   Future    Expected Date:   02/07/2024    Expiration Date:   05/07/2024   CMP (Cancer Center only)    Standing Status:   Future    Expected Date:   02/07/2024    Expiration Date:   05/07/2024   Kappa/lambda light chains    Standing Status:   Future     Expected Date:   02/07/2024    Expiration Date:   05/07/2024   Multiple Myeloma Panel (SPEP&IFE w/QIG)    Standing Status:   Future    Expected Date:   02/07/2024    Expiration Date:   05/07/2024   Folate    Standing Status:   Future    Expected Date:   02/07/2024    Expiration Date:   08/06/2024    Follow up in 6 months.  All questions were answered. The patient knows to call the clinic with any problems, questions or concerns.  Call Babara, MD, PhD [facility] Hematology Oncology 08/07/2023   CHIEF COMPLAINTS/REASON FOR VISIT:  MGUS  HISTORY OF PRESENTING ILLNESS:   Tina Silva is a  89 y.o.  female presents for MGUS  Patient has CKD, follows up with nephrology.  Work up showed  02/21/2021, free light chain ratio 10.4  dad ? MM, m grandmother MM.  03/29/2021, free light chain ratio 11.12 Random urine protein electrophoresis showed faint albumin band detected on urine protein electrophoresis.  No abnormal Bence Jones proteinuria. 04/05/2021, serum protein electrophoresis showed a 2 abnormal protein bands detected in the gamma globulin.  She has chronic history of thrombocytopenia, recent cbc showed platelet count of 111, which is slightly lower than her baseline.  Denies any easy bruising or active bleeding events.  Denies any alcohol use   INTERVAL HISTORY Tina Silva is  a 89 y.o. female who has above history reviewed by me today presents for follow up visit for MGUS.  Patient presents to review lab results.  No new complaints.  Denies any recent infections, vaccinations. She has back pain occasionally.    Review of Systems  Constitutional:  Positive for fatigue. Negative for appetite change, chills and fever.  HENT:   Negative for hearing loss and voice change.   Eyes:  Negative for eye problems.  Respiratory:  Negative for chest tightness and cough.   Cardiovascular:  Negative for chest pain.  Gastrointestinal:  Negative for abdominal distention, abdominal pain and blood  in stool.  Endocrine: Negative for hot flashes.  Genitourinary:  Negative for difficulty urinating and frequency.   Musculoskeletal:  Positive for back pain. Negative for arthralgias.  Skin:  Negative for itching and rash.  Neurological:  Negative for extremity weakness.  Hematological:  Negative for adenopathy.  Psychiatric/Behavioral:  Negative for confusion.     MEDICAL HISTORY:  Past Medical History:  Diagnosis Date   Hyperlipidemia    Hypertension    Hypothyroidism    Multiple myeloma (HCC)    Osteoporosis    Vertigo 07/28/2016    SURGICAL HISTORY: Past Surgical History:  Procedure Laterality Date   ABDOMINAL HYSTERECTOMY     APPENDECTOMY  1974   BREAST BIOPSY Left 11/04/2005   core   COLONOSCOPY WITH PROPOFOL  N/A 02/12/2016   Procedure: COLONOSCOPY WITH PROPOFOL ;  Surgeon: Lamar ONEIDA Holmes, MD;  Location: [facility] ENDOSCOPY;  Service: Endoscopy;  Laterality: N/A;   EYE SURGERY  june 2013   cataract with lens,  dinglein left eye    TONSILLECTOMY     TONSILLECTOMY AND ADENOIDECTOMY      SOCIAL HISTORY: Social History   Socioeconomic History   Marital status: Single    Spouse name: Not on file   Number of children: Not on file   Years of education: Not on file   Highest education level: Bachelor's degree (e.g., BA, AB, BS)  Occupational History   Not on file  Tobacco Use   Smoking status: Never   Smokeless tobacco: Never  Vaping Use   Vaping status: Never Used  Substance and Sexual Activity   Alcohol use: No   Drug use: No   Sexual activity: Never  Other Topics Concern   Not on file  Social History Narrative   Independent at baseline   Left handed    Social Drivers of Health   Financial Resource Strain: Low Risk  (04/14/2023)   Overall Financial Resource Strain (CARDIA)    Difficulty of Paying Living Expenses: Not hard at all  Food Insecurity: No Food Insecurity (04/14/2023)   Hunger Vital Sign    Worried About Running Out of Food in the Last Year:  Never true    Ran Out of Food in the Last Year: Never true  Transportation Needs: No Transportation Needs (04/14/2023)   PRAPARE - Administrator, Civil Service (Medical): No    Lack of Transportation (Non-Medical): No  Physical Activity: Insufficiently Active (04/14/2023)   Exercise Vital Sign    Days of Exercise per Week: 4 days    Minutes of Exercise per Session: 30 min  Stress: No Stress Concern Present (04/14/2023)   [employer] of Occupational Health - Occupational Stress Questionnaire    Feeling of Stress : Not at all  Social Connections: Moderately Integrated (04/14/2023)   Social Connection and Isolation Panel    Frequency of Communication with Friends and  Family: More than three times a week    Frequency of Social Gatherings with Friends and Family: More than three times a week    Attends Religious Services: More than 4 times per year    Active Member of [employer] or Organizations: Yes    Attends Engineer, structural: More than 4 times per year    Marital Status: Never married  Intimate Partner Violence: Not At Risk (04/14/2023)   Humiliation, Afraid, Rape, and Kick questionnaire    Fear of Current or Ex-Partner: No    Emotionally Abused: No    Physically Abused: No    Sexually Abused: No    FAMILY HISTORY: Family History  Problem Relation Age of Onset   Parkinson's disease Mother    Cancer Sister        AML   Diabetes Sister    Heart disease Father    Diabetes Paternal Aunt    Cancer Maternal Grandmother    Breast cancer Neg Hx     ALLERGIES:  is allergic to lidocaine -epinephrine (pf), ciprofloxacin, doxycycline, flagyl [metronidazole], iodine, lidocaine , and sudafed [pseudoephedrine hcl].  MEDICATIONS:  Current Outpatient Medications  Medication Sig Dispense Refill   aspirin  EC 81 MG tablet Take 81 mg by mouth daily. Swallow whole.     Calcium  Carbonate-Vit D-Min (CALTRATE PLUS PO) Take 1,200 mg by mouth daily.     Cholecalciferol  (VITAMIN  D3) 1000 units CAPS Take 1 capsule by mouth.     losartan (COZAAR) 25 MG tablet      metoprolol  succinate (TOPROL -XL) 25 MG 24 hr tablet TAKE ONE-HALF TABLET TWICE DAILY 90 tablet 1   Multiple Vitamin (MULTIVITAMIN) tablet Take 1 tablet by mouth daily.     rosuvastatin  (CRESTOR ) 10 MG tablet Take 1 tablet (10 mg total) by mouth daily. 90 tablet 3   No current facility-administered medications for this visit.     PHYSICAL EXAMINATION: ECOG PERFORMANCE STATUS: 1 - Symptomatic but completely ambulatory Vitals:   08/07/23 0951  BP: 128/69  Resp: 18  Temp: 98.1 F (36.7 C)   Filed Weights   08/07/23 0951  Weight: 136 lb 4.8 oz (61.8 kg)    Physical Exam Constitutional:      General: She is not in acute distress. HENT:     Head: Normocephalic and atraumatic.  Eyes:     General: No scleral icterus. Cardiovascular:     Rate and Rhythm: Normal rate and regular rhythm.     Heart sounds: Normal heart sounds.  Pulmonary:     Effort: Pulmonary effort is normal. No respiratory distress.     Breath sounds: No wheezing.  Abdominal:     General: Bowel sounds are normal. There is no distension.     Palpations: Abdomen is soft.  Musculoskeletal:        General: No deformity. Normal range of motion.     Cervical back: Normal range of motion and neck supple.  Skin:    General: Skin is warm and dry.     Findings: No erythema or rash.  Neurological:     Mental Status: She is alert and oriented to person, place, and time. Mental status is at baseline.     Cranial Nerves: No cranial nerve deficit.     Coordination: Coordination normal.  Psychiatric:        Mood and Affect: Mood normal.     LABORATORY DATA:  I have reviewed the data as listed    Latest Ref Rng & Units 07/28/2023  9:50 AM 01/13/2023    9:33 AM 07/05/2022   10:26 AM  CBC  WBC 4.0 - 10.5 K/uL 4.7  4.2  4.5   Hemoglobin 12.0 - 15.0 g/dL 86.8  86.9  86.9   Hematocrit 36.0 - 46.0 % 39.1  38.6  39.7   Platelets 150 -  400 K/uL 112  121  123       Latest Ref Rng & Units 07/28/2023    9:50 AM 01/13/2023    9:33 AM 09/19/2022   10:11 AM  CMP  Glucose 70 - 99 mg/dL 97  99  97   BUN 8 - 23 mg/dL 22  24  20    Creatinine 0.44 - 1.00 mg/dL 9.05  9.02  9.03   Sodium 135 - 145 mmol/L 137  140  140   Potassium 3.5 - 5.1 mmol/L 4.4  4.6  4.5   Chloride 98 - 111 mmol/L 102  102  102   CO2 22 - 32 mmol/L 28  30  33   Calcium  8.9 - 10.3 mg/dL 8.8  9.2  9.2   Total Protein 6.5 - 8.1 g/dL 6.1  6.1  5.9   Total Bilirubin 0.0 - 1.2 mg/dL 0.9  0.8  0.8   Alkaline Phos 38 - 126 U/L 58  51  56   AST 15 - 41 U/L 21  20  20    ALT 0 - 44 U/L 18  18  17      RADIOGRAPHIC STUDIES: I have personally reviewed the radiological images as listed and agreed with the findings in the report. No results found.

## 2023-08-07 NOTE — Assessment & Plan Note (Signed)
 Previous workup includes normal vitamin B12, normal folate, negative HIV, negative hepatitis panel, negative ANA, normal immature platelet fraction.  Peripheral blood flow cytometry is negative for significant immunophenotypic abnormality.  Labs reviewed and discussed with patient. Slightly decreased count.  Continue to monitor.

## 2023-08-08 ENCOUNTER — Ambulatory Visit
Admission: RE | Admit: 2023-08-08 | Discharge: 2023-08-08 | Disposition: A | Discharge status: Home / Self Care | Source: Ambulatory Visit | Attending: Oncology | Admitting: Oncology

## 2023-08-08 ENCOUNTER — Ambulatory Visit
Admission: RE | Admit: 2023-08-08 | Discharge: 2023-08-08 | Disposition: A | Discharge status: Home / Self Care | Source: Ambulatory Visit | Attending: Oncology

## 2023-08-08 DIAGNOSIS — M8588 Other specified disorders of bone density and structure, other site: Secondary | ICD-10-CM | POA: Diagnosis not present

## 2023-08-08 DIAGNOSIS — D472 Monoclonal gammopathy: Secondary | ICD-10-CM

## 2023-08-11 ENCOUNTER — Ambulatory Visit (INDEPENDENT_AMBULATORY_CARE_PROVIDER_SITE_OTHER): Payer: Medicare Other | Admitting: Neurology

## 2023-08-11 ENCOUNTER — Encounter: Payer: Self-pay | Admitting: Neurology

## 2023-08-11 VITALS — BP 126/63 | HR 70 | Ht 63.0 in | Wt 137.0 lb

## 2023-08-11 DIAGNOSIS — R2689 Other abnormalities of gait and mobility: Secondary | ICD-10-CM | POA: Diagnosis not present

## 2023-08-11 NOTE — Progress Notes (Signed)
 NEUROLOGY FOLLOW UP OFFICE NOTE  Tina Silva [id number] Apr 01, 1934  HISTORY OF PRESENT ILLNESS: I had the pleasure of seeing Tina Silva in follow-up in the neurology clinic on 08/11/2023.  The patient was last seen a year ago. She is alone in the office today.Records and images were personally reviewed where available.  She had 2 episodes of imbalance in 2022. Brain MRI no acute changes, moderate chronic microvascular disease. Carotid doppler no significant stenosis. MRA head done 01/2021 no flow-limiting stenosis. There was note of atherosclerotic disease with mild irregularity of the left M2 MCA vessels, there was a 1-62mm inferiorly projecting vascular protrusion arising from the cavernous left ICA which may reflect a small aneurysm. EEG in 01/2021 was normal. Repeat MRA in 07/2021 showed unchanged 1-55mm protrusion from right cavernous ICA, indeterminate for tiny aneurysm. She had another dizzy spell in 10/2021 when she woke up and sat on the side of the bed feeling like her brain was waving but no spinning sensation, lasting a minute. She felt unbalanced for a day after. Repeat brain MRI no acute changes. She reports doing well in the past year. No further similar dizzy spells, some times she may step sideways and she checked BP noting it was lower than usual (SBP 100). She denies any headaches, vision changes, focal numbness/tingling/weakness. Sleep is pretty good. She was sitting in a recliner one time and felt a buzzing sensation in the middle of her back, she sat up and the sensation stopped. Sometimes when she eats, her left hand quivers just a little bit, it does not bother her, and symptoms stop when she puts down her utensil. She has occasional pain in her neck going up the back of her head. She has been found to have osteopenia and advised to use the treadmill regularly. She lives alone.    History on Initial Assessment 01/30/2021: This is a pleasant 89 year old left-handed woman with a  history of hypertension, hyperlipidemia, hypothyroidism, MGUS, macrocytosis, presenting for evaluation of abnormal brain MRI. MRI brain was ordered due to 2 episodes of imbalance. The first episode occurred in 08/2020, she was seated in her living and got up, then noticed she kept bumping the wall on her left side. This lasted for a day then resolved. There were no other associated symptoms. A month later on 10/21/20, she drove to the end of her street then her head hurt real bad on the vertex for a few seconds. She continued driving then noticed that everything was black and white, there was no color, just like a black veil in front of me. She drove back home and as she got out of the car, she almost fell. Her balance was really, really bad on the left, she had to hold on until she got inside. She closed her eyes, and 15 minutes later, color and balance was back. She denied feeling dizzy, no spinning/lightheaded sensation, nausea/vomiting, no diplopia, dysarthria/dysphagia, focal numbness/tingling/weakness, bowel/bladder dysfunction. No loss of awareness. She denies any olfactory/gustatory hallucinations, myoclonic jerks. Sleep is good. She had seen her eye doctor and ENT with normal exams. She denies any further head pains, no recent head injuries, infections, or medication changes. No family history of similar symptoms.   Carotid dopplers in 11/2020 no significant stenosis. She had an MRI brain without contrast on 12/30/20 which I personally reviewed, no acute changes. There was moderate chronic microvascular disease, no change from 2018 imaging.   Laboratory Data: Lab Results  Component Value Date  CHOL 131 02/08/2021   HDL 49.90 02/08/2021   LDLCALC 55 02/08/2021   TRIG 131.0 02/08/2021   CHOLHDL 3 02/08/2021   Lab Results  Component Value Date   HGBA1C 5.1 07/16/2021    PAST MEDICAL HISTORY: Past Medical History:  Diagnosis Date   Hyperlipidemia    Hypertension    Hypothyroidism     Multiple myeloma (HCC)    Osteoporosis    Vertigo 07/28/2016    MEDICATIONS: Current Outpatient Medications on File Prior to Visit  Medication Sig Dispense Refill   aspirin  EC 81 MG tablet Take 81 mg by mouth daily. Swallow whole.     Calcium  Carbonate-Vit D-Min (CALTRATE PLUS PO) Take 1,200 mg by mouth daily.     Cholecalciferol  (VITAMIN D3) 1000 units CAPS Take 1 capsule by mouth.     losartan (COZAAR) 25 MG tablet      metoprolol  succinate (TOPROL -XL) 25 MG 24 hr tablet TAKE ONE-HALF TABLET TWICE DAILY 90 tablet 1   Multiple Vitamin (MULTIVITAMIN) tablet Take 1 tablet by mouth daily.     rosuvastatin  (CRESTOR ) 10 MG tablet Take 1 tablet (10 mg total) by mouth daily. 90 tablet 3   No current facility-administered medications on file prior to visit.    ALLERGIES: Allergies  Allergen Reactions   Lidocaine -Epinephrine (Pf) Other (See Comments)    Chest tightness   Ciprofloxacin     Causes heart beat to skip   Doxycycline Other (See Comments)    Chest pain   Flagyl [Metronidazole]     Causes heart beat to skip   Iodine     Hot and tremmors   Lidocaine     Sudafed [Pseudoephedrine Hcl]     Felt like her body would explode.    FAMILY HISTORY: Family History  Problem Relation Age of Onset   Parkinson's disease Mother    Cancer Sister        AML   Diabetes Sister    Heart disease Father    Diabetes Paternal Aunt    Cancer Maternal Grandmother    Breast cancer Neg Hx     SOCIAL HISTORY: Social History   Socioeconomic History   Marital status: Single    Spouse name: Not on file   Number of children: Not on file   Years of education: Not on file   Highest education level: Bachelor's degree (e.g., BA, AB, BS)  Occupational History   Not on file  Tobacco Use   Smoking status: Never   Smokeless tobacco: Never  Vaping Use   Vaping status: Never Used  Substance and Sexual Activity   Alcohol use: No   Drug use: No   Sexual activity: Never  Other Topics Concern    Not on file  Social History Narrative   Independent at baseline   Left handed    Social Drivers of Health   Financial Resource Strain: Low Risk  (04/14/2023)   Overall Financial Resource Strain (CARDIA)    Difficulty of Paying Living Expenses: Not hard at all  Food Insecurity: No Food Insecurity (04/14/2023)   Hunger Vital Sign    Worried About Running Out of Food in the Last Year: Never true    Ran Out of Food in the Last Year: Never true  Transportation Needs: No Transportation Needs (04/14/2023)   PRAPARE - Administrator, Civil Service (Medical): No    Lack of Transportation (Non-Medical): No  Physical Activity: Insufficiently Active (04/14/2023)   Exercise Vital Sign    Days  of Exercise per Week: 4 days    Minutes of Exercise per Session: 30 min  Stress: No Stress Concern Present (04/14/2023)   [employer] of Occupational Health - Occupational Stress Questionnaire    Feeling of Stress : Not at all  Social Connections: Moderately Integrated (04/14/2023)   Social Connection and Isolation Panel    Frequency of Communication with Friends and Family: More than three times a week    Frequency of Social Gatherings with Friends and Family: More than three times a week    Attends Religious Services: More than 4 times per year    Active Member of [employer] or Organizations: Yes    Attends Banker Meetings: More than 4 times per year    Marital Status: Never married  Intimate Partner Violence: Not At Risk (04/14/2023)   Humiliation, Afraid, Rape, and Kick questionnaire    Fear of Current or Ex-Partner: No    Emotionally Abused: No    Physically Abused: No    Sexually Abused: No     PHYSICAL EXAM: Vitals:   08/11/23 0945  BP: 126/63  Pulse: 70  SpO2: 95%   General: No acute distress Head:  Normocephalic/atraumatic Skin/Extremities: No rash, no edema Neurological Exam: alert and awake. No aphasia or dysarthria. Fund of knowledge is appropriate. Attention  and concentration are normal.   Cranial nerves: Pupils equal, round. Extraocular movements intact with no nystagmus. Visual fields full.  No facial asymmetry.  Motor: Bulk and tone normal, no cogwheeling, muscle strength 5/5 throughout with no pronator drift.   Finger to nose testing intact.  Gait narrow-based and steady, able to tandem walk adequately.  Romberg negative. Good finger taps, RAMs. Negative postural instability. No tremors in office today.   IMPRESSION: This is a pleasant 89 yo LH woman with a history of hypertension, hyperlipidemia, hypothyroidism, macrocytosis, who presented with episodes of imbalance, brain MRI no acute changes, there was moderate chronic microvascular disease. Carotid doppler, MRA head did not show any significant flow-limiting stenosis. EEG normal. MRA head showed a very small 1-67mm inferiorly projecting vascular protrusion arising from the cavernous left ICA, which may reflect a small aneurysm.  Repeat imaging 07/2021 showed unchanged 1-41mm protrusion from right cavernous ICA, indeterminate for tiny aneurysm. She has been doing well with no further episodes since 10/2021. Her neurological exam today is normal. Continue daily aspirin , control of vascular risk factors. Encouraged to continue regular exercise. Follow-up as needed, call for any changes.    Thank you for allowing me to participate in her care.  Please do not hesitate to call for any questions or concerns.    Darice Shivers, M.D.   CC: Dr. Tullo

## 2023-08-11 NOTE — Patient Instructions (Signed)
 Good to see you doing well! Continue regular exercise. Follow-up as needed, call for any changes.

## 2023-09-22 ENCOUNTER — Encounter: Payer: Self-pay | Admitting: Internal Medicine

## 2023-09-22 ENCOUNTER — Ambulatory Visit (INDEPENDENT_AMBULATORY_CARE_PROVIDER_SITE_OTHER): Payer: Medicare Other | Admitting: Internal Medicine

## 2023-09-22 VITALS — BP 124/66 | HR 83 | Ht 63.0 in | Wt 134.4 lb

## 2023-09-22 DIAGNOSIS — E7800 Pure hypercholesterolemia, unspecified: Secondary | ICD-10-CM

## 2023-09-22 DIAGNOSIS — M6289 Other specified disorders of muscle: Secondary | ICD-10-CM

## 2023-09-22 DIAGNOSIS — R5383 Other fatigue: Secondary | ICD-10-CM

## 2023-09-22 DIAGNOSIS — D472 Monoclonal gammopathy: Secondary | ICD-10-CM

## 2023-09-22 DIAGNOSIS — I10 Essential (primary) hypertension: Secondary | ICD-10-CM | POA: Diagnosis not present

## 2023-09-22 DIAGNOSIS — Z1231 Encounter for screening mammogram for malignant neoplasm of breast: Secondary | ICD-10-CM

## 2023-09-22 DIAGNOSIS — E785 Hyperlipidemia, unspecified: Secondary | ICD-10-CM | POA: Diagnosis not present

## 2023-09-22 DIAGNOSIS — R7301 Impaired fasting glucose: Secondary | ICD-10-CM | POA: Diagnosis not present

## 2023-09-22 DIAGNOSIS — D7589 Other specified diseases of blood and blood-forming organs: Secondary | ICD-10-CM | POA: Diagnosis not present

## 2023-09-22 LAB — LIPID PANEL
Cholesterol: 96 mg/dL (ref 0–200)
HDL: 47 mg/dL (ref >39.00)
LDL Cholesterol: 29 mg/dL (ref 0–99)
NonHDL: 48.52
Total CHOL/HDL Ratio: 2
Triglycerides: 99 mg/dL (ref 0.0–149.0)
VLDL: 19.8 mg/dL (ref 0.0–40.0)

## 2023-09-22 LAB — TSH: TSH: 3.22 u[IU]/mL (ref 0.35–5.50)

## 2023-09-22 LAB — FOLATE: Folate: >23.4 ng/mL (ref >5.9)

## 2023-09-22 LAB — C-REACTIVE PROTEIN: CRP: <1 mg/dL (ref 0.5–20.0)

## 2023-09-22 LAB — CK: Total CK: 55 U/L (ref 17–177)

## 2023-09-22 LAB — HEMOGLOBIN A1C: Hgb A1c MFr Bld: 5.4 % (ref 4.6–6.5)

## 2023-09-22 MED ORDER — METOPROLOL SUCCINATE ER 25 MG PO TB24: ORAL_TABLET | ORAL | 1 refills | Status: DC

## 2023-09-22 MED ORDER — ROSUVASTATIN CALCIUM 10 MG PO TABS: 10.0000 mg | ORAL_TABLET | Freq: Every day | ORAL | 3 refills | Status: AC

## 2023-09-22 MED ORDER — LOSARTAN POTASSIUM 25 MG PO TABS: 25.0000 mg | ORAL_TABLET | Freq: Every day | ORAL | 1 refills | Status: DC

## 2023-09-22 NOTE — Assessment & Plan Note (Signed)
 MGUS noted   Last vitamin B12 and Folate Lab Results  Component Value Date   VITAMINB12 853 07/28/2023   FOLATE >40.0 01/31/2022

## 2023-09-22 NOTE — Patient Instructions (Signed)
 We are checking your thyroid  and muscle enzymes today to rule out serious causes of your muscle fatigue

## 2023-09-22 NOTE — Assessment & Plan Note (Signed)
 IgG kappa MGUS.  Multiple myeloma panel showed M protein of 0.1, immunofixation showed IgG monoclonal protein with kappa light chain. [Previously specificity. a biclonal IgG protein with kappa specificity] Light chain ratio 9.06  previous 24-hour urine protein electrophoresis did not detect any M spike. Recommend monitoring repeat blood work every 6 months. BONE SURVEY WAS NEGATIVE JULY 2025

## 2023-09-22 NOTE — Progress Notes (Unsigned)
 Subjective:  Patient ID: Tina Silva, female    DOB: [birth year]  Age: 89 y.o. MRN: [id number]  CC: The primary encounter diagnosis was Encounter for screening mammogram for malignant neoplasm of breast. Diagnoses of MGUS (monoclonal gammopathy of unknown significance), Macrocytosis without anemia, Peripheral muscle fatigue, Impaired fasting glucose, Pure hypercholesterolemia, Other fatigue, Primary hypertension, and Hyperlipidemia with target LDL less than 100 were also pertinent to this visit.   HPI Tina Silva presents for  Chief Complaint  Patient presents with   Medical Management of Chronic Issues    6 month follow up     1) MGUS:  SEEING DR YU EVERY 6 MONTHS FOR SEROLOGIC FOLLOW UP.  BONE SURVERY WAS NORMAL JULY   HAS BEEN HAVING MORE FATIGUE AND BACK PAIN SINCE MARCH .  Sleeping well, but her cat interrupts her every 2 hours starting at midnight. Does not npa during the day habitually . 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    CKD RESOLVED PER KOLLURU.  Advised to continue losartan .  Needs refo;;     Outpatient Medications Prior to Visit  Medication Sig Dispense Refill   aspirin  EC 81 MG tablet Take 81 mg by mouth daily. Swallow whole.     Calcium  Carbonate-Vit D-Min (CALTRATE PLUS PO) Take 1,200 mg by mouth daily.     Cholecalciferol  (VITAMIN D3) 1000 units CAPS Take 1 capsule by mouth.     Multiple Vitamin (MULTIVITAMIN) tablet Take 1 tablet by mouth daily.     losartan  (COZAAR ) 25 MG tablet      metoprolol  succinate (TOPROL -XL) 25 MG 24 hr tablet TAKE ONE-HALF TABLET TWICE DAILY 90 tablet 1   rosuvastatin  (CRESTOR ) 10 MG tablet Take 1 tablet (10 mg total) by mouth daily. 90 tablet 3   No facility-administered medications prior to visit.    Review of Systems;  Patient denies headache, fevers, malaise, unintentional weight  loss, skin rash, eye pain, sinus congestion and sinus pain, sore throat, dysphagia,  hemoptysis , cough, dyspnea, wheezing, chest pain, palpitations, orthopnea, edema, abdominal pain, nausea, melena, diarrhea, constipation, flank pain, dysuria, hematuria, urinary  Frequency, nocturia, numbness, tingling, seizures,  Focal weakness, Loss of consciousness,  Tremor, insomnia, depression, anxiety, and suicidal ideation.      Objective:  BP 124/66   Pulse 83   Ht 5' 3 (1.6 m)   Wt 134 lb 6.4 oz (61 kg)   SpO2 97%   BMI 23.81 kg/m   BP Readings from Last 3 Encounters:  09/22/23 124/66  08/11/23 126/63  08/07/23 128/69    Wt Readings from Last 3 Encounters:  09/22/23 134 lb 6.4 oz (61 kg)  08/11/23 137 lb (62.1 kg)  08/07/23 136 lb 4.8 oz (61.8 kg)    Physical Exam Vitals reviewed.  Constitutional:      General: She is not in acute distress.    Appearance: Normal appearance. She is normal weight. She is not ill-appearing, toxic-appearing or diaphoretic.  HENT:     Head: Normocephalic.  Eyes:     General: No scleral icterus.       Right eye: No discharge.        Left eye: No discharge.     Conjunctiva/sclera: Conjunctivae normal.  Cardiovascular:     Rate and Rhythm: Normal rate and regular rhythm.     Heart  sounds: Normal heart sounds.  Pulmonary:     Effort: Pulmonary effort is normal. No respiratory distress.     Breath sounds: Normal breath sounds.  Musculoskeletal:        General: Normal range of motion.  Skin:    General: Skin is warm and dry.  Neurological:     General: No focal deficit present.     Mental Status: She is alert and oriented to person, place, and time. Mental status is at baseline.  Psychiatric:        Mood and Affect: Mood normal.        Behavior: Behavior normal.        Thought Content: Thought content normal.        Judgment: Judgment normal.     Lab Results  Component Value Date   HGBA1C 5.4 09/22/2023   HGBA1C 5.1 07/16/2021   HGBA1C  5.2 07/03/2017    Lab Results  Component Value Date   CREATININE 0.94 07/28/2023   CREATININE 0.97 01/13/2023   CREATININE 0.96 09/19/2022    Lab Results  Component Value Date   WBC 4.7 07/28/2023   HGB 13.1 07/28/2023   HCT 39.1 07/28/2023   PLT 112 (L) 07/28/2023   GLUCOSE 97 07/28/2023   CHOL 96 09/22/2023   TRIG 99.0 09/22/2023   HDL 47.00 09/22/2023   LDLDIRECT 50.0 09/19/2022   LDLCALC 29 09/22/2023   ALT 18 07/28/2023   AST 21 07/28/2023   NA 137 07/28/2023   K 4.4 07/28/2023   CL 102 07/28/2023   CREATININE 0.94 07/28/2023   BUN 22 07/28/2023   CO2 28 07/28/2023   TSH 3.22 09/22/2023   HGBA1C 5.4 09/22/2023    DG Bone Survey Met Result Date: 08/10/2023 CLINICAL DATA:  MGUS EXAM: METASTATIC BONE SURVEY COMPARISON:  None Available. FINDINGS: No suspicious lytic or osseous lesions are identified. The bones are mildly osteopenic. No evidence for fracture. IMPRESSION: No suspicious lytic or osseous lesions are identified. The bones are mildly osteopenic. Electronically Signed   By: Greig Pique M.D.   On: 08/10/2023 19:22    Assessment & Plan:  .Encounter for screening mammogram for malignant neoplasm of breast -     3D Screening Mammogram, Left and Right; Future  MGUS (monoclonal gammopathy of unknown significance) Assessment & Plan:  IgG kappa MGUS.  Multiple myeloma panel showed M protein of 0.1, immunofixation showed IgG monoclonal protein with kappa light chain. [Previously specificity. a biclonal IgG protein with kappa specificity] Light chain ratio 9.06  previous 24-hour urine protein electrophoresis did not detect any M spike. Recommend monitoring repeat blood work every 6 months. BONE SURVEY WAS NEGATIVE JULY 2025   Macrocytosis without anemia Assessment & Plan: MGUS noted   Last vitamin B12 and Folate Lab Results  Component Value Date   VITAMINB12 853 07/28/2023   FOLATE >40.0 01/31/2022     Peripheral muscle fatigue Assessment &  Plan: Exam is notable for normal strength which does not diminish with repeated testing. Thyroid  function,  CK are norma.  No futherr workup advised   Lab Results  Component Value Date   TSH 3.22 09/22/2023   Lab Results  Component Value Date   CKTOTAL 55 09/22/2023   TROPONINI <0.03 07/28/2016     Orders: -     CK -     Folate -     C-reactive protein  Impaired fasting glucose -     Hemoglobin A1c  Pure hypercholesterolemia -     Lipid  panel  Other fatigue -     TSH  Primary hypertension Assessment & Plan: Well controlled on current regimen. Renal function stable, no changes today.    Hyperlipidemia with target LDL less than 100 Assessment & Plan: She is taking a high  potency statin and LDL is <70.  No changes today. LFTs aand CK are normal;  Lab Results  Component Value Date   CHOL 96 09/22/2023   HDL 47.00 09/22/2023   LDLCALC 29 09/22/2023   LDLDIRECT 50.0 09/19/2022   TRIG 99.0 09/22/2023   CHOLHDL 2 09/22/2023   Lab Results  Component Value Date   ALT 18 07/28/2023   AST 21 07/28/2023   ALKPHOS 58 07/28/2023   BILITOT 0.9 07/28/2023   Lab Results  Component Value Date   CKTOTAL 55 09/22/2023   TROPONINI <0.03 07/28/2016      Other orders -     Metoprolol  Succinate ER; TAKE ONE-HALF TABLET TWICE DAILY  Dispense: 90 tablet; Refill: 1 -     Rosuvastatin  Calcium ; Take 1 tablet (10 mg total) by mouth daily.  Dispense: 90 tablet; Refill: 3 -     Losartan  Potassium; Take 1 tablet (25 mg total) by mouth daily.  Dispense: 90 tablet; Refill: 1    Follow-up: Return in about 3 months (around 12/23/2023) for muscle weakness .   Verneita LITTIE Kettering, MD

## 2023-09-23 ENCOUNTER — Ambulatory Visit: Payer: Self-pay | Admitting: Internal Medicine

## 2023-09-23 DIAGNOSIS — M6289 Other specified disorders of muscle: Secondary | ICD-10-CM | POA: Insufficient documentation

## 2023-09-23 NOTE — Assessment & Plan Note (Addendum)
 Exam is notable for normal strength which does not diminish with repeated testing. Thyroid  function,  CK are norma.  No futherr workup advised   Lab Results  Component Value Date   TSH 3.22 09/22/2023   Lab Results  Component Value Date   CKTOTAL 55 09/22/2023   TROPONINI <0.03 07/28/2016

## 2023-09-23 NOTE — Assessment & Plan Note (Signed)
 She is taking a high  potency statin and LDL is <70.  No changes today. LFTs aand CK are normal;  Lab Results  Component Value Date   CHOL 96 09/22/2023   HDL 47.00 09/22/2023   LDLCALC 29 09/22/2023   LDLDIRECT 50.0 09/19/2022   TRIG 99.0 09/22/2023   CHOLHDL 2 09/22/2023   Lab Results  Component Value Date   ALT 18 07/28/2023   AST 21 07/28/2023   ALKPHOS 58 07/28/2023   BILITOT 0.9 07/28/2023   Lab Results  Component Value Date   CKTOTAL 55 09/22/2023   TROPONINI <0.03 07/28/2016

## 2023-09-23 NOTE — Assessment & Plan Note (Signed)
Well controlled on current regimen. Renal function stable, no changes today. 

## 2023-10-08 ENCOUNTER — Ambulatory Visit
Admission: RE | Admit: 2023-10-08 | Discharge: 2023-10-08 | Disposition: A | Discharge status: Home / Self Care | Source: Ambulatory Visit | Attending: Internal Medicine | Admitting: Internal Medicine

## 2023-10-08 DIAGNOSIS — Z1231 Encounter for screening mammogram for malignant neoplasm of breast: Secondary | ICD-10-CM | POA: Diagnosis not present

## 2023-11-11 DIAGNOSIS — Z23 Encounter for immunization: Secondary | ICD-10-CM | POA: Diagnosis not present

## 2023-12-22 DIAGNOSIS — Z961 Presence of intraocular lens: Secondary | ICD-10-CM | POA: Diagnosis not present

## 2023-12-22 DIAGNOSIS — H2511 Age-related nuclear cataract, right eye: Secondary | ICD-10-CM | POA: Diagnosis not present

## 2023-12-23 ENCOUNTER — Encounter: Payer: Self-pay | Admitting: Internal Medicine

## 2023-12-23 ENCOUNTER — Ambulatory Visit (INDEPENDENT_AMBULATORY_CARE_PROVIDER_SITE_OTHER): Admitting: Internal Medicine

## 2023-12-23 VITALS — BP 132/62 | HR 71 | Ht 63.0 in | Wt 134.4 lb

## 2023-12-23 DIAGNOSIS — F5102 Adjustment insomnia: Secondary | ICD-10-CM | POA: Diagnosis not present

## 2023-12-23 DIAGNOSIS — D696 Thrombocytopenia, unspecified: Secondary | ICD-10-CM

## 2023-12-23 DIAGNOSIS — F418 Other specified anxiety disorders: Secondary | ICD-10-CM | POA: Diagnosis not present

## 2023-12-23 DIAGNOSIS — E785 Hyperlipidemia, unspecified: Secondary | ICD-10-CM | POA: Diagnosis not present

## 2023-12-23 DIAGNOSIS — D7589 Other specified diseases of blood and blood-forming organs: Secondary | ICD-10-CM

## 2023-12-23 DIAGNOSIS — I6522 Occlusion and stenosis of left carotid artery: Secondary | ICD-10-CM | POA: Diagnosis not present

## 2023-12-23 DIAGNOSIS — N1831 Chronic kidney disease, stage 3a: Secondary | ICD-10-CM | POA: Diagnosis not present

## 2023-12-23 DIAGNOSIS — D472 Monoclonal gammopathy: Secondary | ICD-10-CM

## 2023-12-23 LAB — LIPID PANEL
Cholesterol: 105 mg/dL (ref 0–200)
HDL: 51.9 mg/dL (ref >39.00)
LDL Cholesterol: 30 mg/dL (ref 0–99)
NonHDL: 52.6
Total CHOL/HDL Ratio: 2
Triglycerides: 114 mg/dL (ref 0.0–149.0)
VLDL: 22.8 mg/dL (ref 0.0–40.0)

## 2023-12-23 LAB — COMPREHENSIVE METABOLIC PANEL WITH GFR
ALT: 16 U/L (ref 0–35)
AST: 17 U/L (ref 0–37)
Albumin: 4.3 g/dL (ref 3.5–5.2)
Alkaline Phosphatase: 53 U/L (ref 39–117)
BUN: 26 mg/dL — ABNORMAL HIGH (ref 6–23)
CO2: 37 meq/L — ABNORMAL HIGH (ref 19–32)
Calcium: 9.9 mg/dL (ref 8.4–10.5)
Chloride: 101 meq/L (ref 96–112)
Creatinine, Ser: 0.95 mg/dL (ref 0.40–1.20)
GFR: 54.07 mL/min — ABNORMAL LOW (ref >60.00)
Glucose, Bld: 88 mg/dL (ref 70–99)
Potassium: 4.5 meq/L (ref 3.5–5.1)
Sodium: 140 meq/L (ref 135–145)
Total Bilirubin: 0.6 mg/dL (ref 0.2–1.2)
Total Protein: 5.9 g/dL — ABNORMAL LOW (ref 6.0–8.3)

## 2023-12-23 LAB — LDL CHOLESTEROL, DIRECT: Direct LDL: 40 mg/dL

## 2023-12-23 LAB — TSH: TSH: 4.11 u[IU]/mL (ref 0.35–5.50)

## 2023-12-23 MED ORDER — LOSARTAN POTASSIUM 25 MG PO TABS: 25.0000 mg | ORAL_TABLET | Freq: Every day | ORAL | 1 refills | Status: AC

## 2023-12-23 MED ORDER — PROPRANOLOL HCL 10 MG PO TABS: ORAL_TABLET | ORAL | 0 refills | Status: AC

## 2023-12-23 MED ORDER — METOPROLOL SUCCINATE ER 25 MG PO TB24: ORAL_TABLET | ORAL | 1 refills | Status: AC

## 2023-12-23 NOTE — Progress Notes (Unsigned)
 Subjective:  Patient ID: Tina Silva, female    DOB: 1934-07-01  Age: 89 y.o. MRN: [id number]  CC: There were no encounter diagnoses.   HPI Kinsly Hild Mensinger presents for  Chief Complaint  Patient presents with   Medical Management of Chronic Issues    3 month follow up, discuss muscle weakness   1) muscle weakness: patient reports that it has resolved. Still taking rosuvastatin .     2) Hypertension: patient checks blood pressure twice weekly at home.  Readings have been for the most part <130/80 at rest notes some elevation that is stress induced  . 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 Swallow whole.     Calcium  Carbonate-Vit D-Min (CALTRATE PLUS PO) Take 1,200 mg by mouth daily.     Cholecalciferol  (VITAMIN D3) 1000 units CAPS Take 1 capsule by mouth.     losartan  (COZAAR ) 25 MG tablet Take 1 tablet (25 mg total) by mouth daily. 90 tablet 1   metoprolol  succinate (TOPROL -XL) 25 MG 24 hr tablet TAKE ONE-HALF TABLET TWICE DAILY 90 tablet 1   Multiple Vitamin (MULTIVITAMIN) tablet Take 1 tablet by mouth daily.     rosuvastatin  (CRESTOR ) 10 MG tablet Take 1 tablet (10 mg total) by mouth daily. 90 tablet 3   No facility-administered medications prior to visit.    Review of Systems;  Patient denies headache, fevers, malaise, unintentional weight loss, skin rash, eye pain, sinus congestion and sinus pain, sore throat, dysphagia,  hemoptysis , cough, dyspnea, wheezing, chest pain, palpitations, orthopnea, edema, abdominal pain, nausea, melena, diarrhea, constipation, flank pain, dysuria, hematuria, urinary   Frequency, nocturia, numbness, tingling, seizures,  Focal weakness, Loss of consciousness,  Tremor, insomnia, depression, anxiety, and suicidal ideation.      Objective:  BP 132/62   Pulse 71   Ht 5' 3 (1.6 m)   Wt 134 lb 6.4 oz (61 kg)   SpO2 98%   BMI 23.81 kg/m   BP Readings from Last 3 Encounters:  12/23/23 132/62  09/22/23 124/66  08/11/23 126/63    Wt Readings from Last 3 Encounters:  12/23/23 134 lb 6.4 oz (61 kg)  09/22/23 134 lb 6.4 oz (61 kg)  08/11/23 137 lb (62.1 kg)    Physical Exam  Lab Results  Component Value Date   HGBA1C 5.4 09/22/2023   HGBA1C 5.1 07/16/2021   HGBA1C 5.2 07/03/2017    Lab Results  Component Value Date   CREATININE 0.94 07/28/2023   CREATININE 0.97 01/13/2023   CREATININE 0.96 09/19/2022    Lab Results  Component Value Date   WBC 4.7 07/28/2023   HGB 13.1 07/28/2023   HCT 39.1 07/28/2023   PLT 112 (L) 07/28/2023   GLUCOSE 97 07/28/2023   CHOL 96 09/22/2023   TRIG 99.0 09/22/2023   HDL 47.00 09/22/2023   LDLDIRECT 50.0 09/19/2022   LDLCALC 29 09/22/2023   ALT 18 07/28/2023   AST 21 07/28/2023   NA 137 07/28/2023   K 4.4 07/28/2023   CL 102 07/28/2023   CREATININE 0.94 07/28/2023  BUN 22 07/28/2023   CO2 28 07/28/2023   TSH 3.22 09/22/2023   HGBA1C 5.4 09/22/2023    MM 3D SCREENING MAMMOGRAM BILATERAL BREAST Result Date: 10/13/2023 CLINICAL DATA:  Screening. EXAM: DIGITAL SCREENING BILATERAL MAMMOGRAM WITH TOMOSYNTHESIS AND CAD TECHNIQUE: Bilateral screening digital craniocaudal and mediolateral oblique mammograms were obtained. Bilateral screening digital breast tomosynthesis was performed. The images were evaluated with computer-aided detection. COMPARISON:  Previous exam(s). ACR Breast Density Category b: There are scattered areas of fibroglandular density. FINDINGS: There are no findings suspicious for malignancy. IMPRESSION: No mammographic evidence of malignancy. A result letter of this screening mammogram  will be mailed directly to the patient. RECOMMENDATION: Screening mammogram in one year. (Code:SM-B-01Y) BI-RADS CATEGORY  1: Negative. Electronically Signed   By: Inocente Ast M.D.   On: 10/13/2023 08:51    Assessment & Plan:  .There are no diagnoses linked to this encounter.   I spent 34 minutes on the day of this face to face encounter reviewing patient's  most recent visit with cardiology,  nephrology,  and neurology,  prior relevant surgical and non surgical procedures, recent  labs and imaging studies, counseling on weight management,  reviewing the assessment and plan with patient, and post visit ordering and reviewing of  diagnostics and therapeutics with patient  .   Follow-up: No follow-ups on file.   Verneita LITTIE Kettering, MD

## 2023-12-23 NOTE — Assessment & Plan Note (Signed)
 Stable, GFR remains borderline She is tolerating ARB and avoiding NSAIDS.  Continue semi annuall follow up with nephrology  Lab Results  Component Value Date   CREATININE 0.95 12/23/2023   Lab Results  Component Value Date   NA 140 12/23/2023   K 4.5 12/23/2023   CL 101 12/23/2023   CO2 37 (H) 12/23/2023

## 2023-12-23 NOTE — Patient Instructions (Addendum)
 I have prescribed propranolol  to use as needed for  stage fright/anxiety which is causing your BP elevation   do not take unless BP >  140  or pulse > 100   You can use Benefiber every  day to keep bowels healthy;  limit miralax to every 3 days if you have not had a BM  I have ordered carotid doppler , it was last done in 2022

## 2023-12-25 ENCOUNTER — Ambulatory Visit: Payer: Self-pay | Admitting: Internal Medicine

## 2023-12-25 DIAGNOSIS — F418 Other specified anxiety disorders: Secondary | ICD-10-CM | POA: Insufficient documentation

## 2023-12-25 NOTE — Assessment & Plan Note (Signed)
 Stable,  > 100K.  Continue to follow   Lab Results  Component Value Date   WBC 4.7 07/28/2023   HGB 13.1 07/28/2023   HCT 39.1 07/28/2023   MCV 101.3 (H) 07/28/2023   PLT 112 (L) 07/28/2023

## 2023-12-25 NOTE — Assessment & Plan Note (Signed)
 Secondary to MGUS.  B12 and folate acid level normal.   Last vitamin B12 and Folate Lab Results  Component Value Date   VITAMINB12 853 07/28/2023   FOLATE >23.4 09/22/2023

## 2023-12-25 NOTE — Assessment & Plan Note (Signed)
 IgG kappa MGUS.  Multiple myeloma panel showed M protein of 0.1, immunofixation showed IgG monoclonal protein with kappa light chain. [Previously specificity. a biclonal IgG protein with kappa specificity] Light chain ratio 9.06  previous 24-hour urine protein electrophoresis did not detect any M spike. Continue  repeat blood work every 6 months. BONE SURVEY WAS NEGATIVE JULY 2025

## 2023-12-25 NOTE — Assessment & Plan Note (Signed)
 She is taking a high  potency statin and LDL is <50.  No changes today. LFTs aand CK are normal;  Lab Results  Component Value Date   CHOL 105 12/23/2023   HDL 51.90 12/23/2023   LDLCALC 30 12/23/2023   LDLDIRECT 40.0 12/23/2023   TRIG 114.0 12/23/2023   CHOLHDL 2 12/23/2023   Lab Results  Component Value Date   ALT 16 12/23/2023   AST 17 12/23/2023   ALKPHOS 53 12/23/2023   BILITOT 0.6 12/23/2023   Lab Results  Component Value Date   CKTOTAL 55 09/22/2023   TROPONINI <0.03 07/28/2016
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
## 2023-12-25 NOTE — Assessment & Plan Note (Signed)
 Recommend use of very low dose immediate release propranolol 

## 2023-12-31 ENCOUNTER — Ambulatory Visit
Admission: RE | Admit: 2023-12-31 | Discharge: 2023-12-31 | Disposition: A | Source: Ambulatory Visit | Attending: Internal Medicine | Admitting: Internal Medicine

## 2023-12-31 DIAGNOSIS — I6522 Occlusion and stenosis of left carotid artery: Secondary | ICD-10-CM | POA: Diagnosis not present

## 2023-12-31 DIAGNOSIS — I6523 Occlusion and stenosis of bilateral carotid arteries: Secondary | ICD-10-CM | POA: Diagnosis not present

## 2023-12-31 DIAGNOSIS — Z8673 Personal history of transient ischemic attack (TIA), and cerebral infarction without residual deficits: Secondary | ICD-10-CM | POA: Diagnosis not present

## 2023-12-31 DIAGNOSIS — E785 Hyperlipidemia, unspecified: Secondary | ICD-10-CM | POA: Diagnosis not present

## 2023-12-31 DIAGNOSIS — I10 Essential (primary) hypertension: Secondary | ICD-10-CM | POA: Diagnosis not present

## 2024-02-02 ENCOUNTER — Inpatient Hospital Stay: Attending: Oncology

## 2024-02-02 DIAGNOSIS — D472 Monoclonal gammopathy: Secondary | ICD-10-CM | POA: Diagnosis present

## 2024-02-02 DIAGNOSIS — D696 Thrombocytopenia, unspecified: Secondary | ICD-10-CM | POA: Diagnosis not present

## 2024-02-02 LAB — CBC WITH DIFFERENTIAL (CANCER CENTER ONLY)
Abs Immature Granulocytes: 0.01 K/uL (ref 0.00–0.07)
Basophils Absolute: 0 K/uL (ref 0.0–0.1)
Basophils Relative: 0 %
Eosinophils Absolute: 0 K/uL (ref 0.0–0.5)
Eosinophils Relative: 1 %
HCT: 39.4 % (ref 36.0–46.0)
Hemoglobin: 13.2 g/dL (ref 12.0–15.0)
Immature Granulocytes: 0 %
Lymphocytes Relative: 22 %
Lymphs Abs: 1 K/uL (ref 0.7–4.0)
MCH: 34.1 pg — ABNORMAL HIGH (ref 26.0–34.0)
MCHC: 33.5 g/dL (ref 30.0–36.0)
MCV: 101.8 fL — ABNORMAL HIGH (ref 80.0–100.0)
Monocytes Absolute: 0.2 K/uL (ref 0.1–1.0)
Monocytes Relative: 5 %
Neutro Abs: 3.4 K/uL (ref 1.7–7.7)
Neutrophils Relative %: 72 %
Platelet Count: 111 K/uL — ABNORMAL LOW (ref 150–400)
RBC: 3.87 MIL/uL (ref 3.87–5.11)
RDW: 11.9 % (ref 11.5–15.5)
WBC Count: 4.7 K/uL (ref 4.0–10.5)
nRBC: 0 % (ref 0.0–0.2)

## 2024-02-02 LAB — CMP (CANCER CENTER ONLY)
ALT: 20 U/L (ref 0–44)
AST: 21 U/L (ref 15–41)
Albumin: 4.3 g/dL (ref 3.5–5.0)
Alkaline Phosphatase: 61 U/L (ref 38–126)
Anion gap: 5 (ref 5–15)
BUN: 20 mg/dL (ref 8–23)
CO2: 32 mmol/L (ref 22–32)
Calcium: 9.7 mg/dL (ref 8.9–10.3)
Chloride: 102 mmol/L (ref 98–111)
Creatinine: 0.96 mg/dL (ref 0.44–1.00)
GFR, Estimated: 57 mL/min — ABNORMAL LOW
Glucose, Bld: 96 mg/dL (ref 70–99)
Potassium: 4.6 mmol/L (ref 3.5–5.1)
Sodium: 139 mmol/L (ref 135–145)
Total Bilirubin: 0.6 mg/dL (ref 0.0–1.2)
Total Protein: 6.1 g/dL — ABNORMAL LOW (ref 6.5–8.1)

## 2024-02-02 LAB — FOLATE: Folate: >20 ng/mL

## 2024-02-03 LAB — KAPPA/LAMBDA LIGHT CHAINS
Kappa free light chain: 101.3 mg/L — ABNORMAL HIGH (ref 3.3–19.4)
Kappa, lambda light chain ratio: 6.07 — ABNORMAL HIGH (ref 0.26–1.65)
Lambda free light chains: 16.7 mg/L (ref 5.7–26.3)

## 2024-02-04 ENCOUNTER — Other Ambulatory Visit: Payer: Self-pay

## 2024-02-04 DIAGNOSIS — D472 Monoclonal gammopathy: Secondary | ICD-10-CM

## 2024-02-04 LAB — MULTIPLE MYELOMA PANEL, SERUM
Albumin SerPl Elph-Mcnc: 3.9 g/dL (ref 2.9–4.4)
Albumin/Glob SerPl: 2 — ABNORMAL HIGH (ref 0.7–1.7)
Alpha 1: 0.2 g/dL (ref 0.0–0.4)
Alpha2 Glob SerPl Elph-Mcnc: 0.6 g/dL (ref 0.4–1.0)
B-Globulin SerPl Elph-Mcnc: 0.7 g/dL (ref 0.7–1.3)
Gamma Glob SerPl Elph-Mcnc: 0.5 g/dL (ref 0.4–1.8)
Globulin, Total: 2 g/dL — ABNORMAL LOW (ref 2.2–3.9)
IgA: 43 mg/dL — ABNORMAL LOW (ref 64–422)
IgG (Immunoglobin G), Serum: 430 mg/dL — ABNORMAL LOW (ref 586–1602)
IgM (Immunoglobulin M), Srm: 84 mg/dL (ref 26–217)
M Protein SerPl Elph-Mcnc: 0.1 g/dL — ABNORMAL HIGH
Total Protein ELP: 5.9 g/dL — ABNORMAL LOW (ref 6.0–8.5)

## 2024-02-10 LAB — IFE+PROTEIN ELECTRO, 24-HR UR
% BETA, Urine: 0 %
ALPHA 1 URINE: 0 %
Albumin, U: 100 %
Alpha 2, Urine: 0 %
GAMMA GLOBULIN URINE: 0 %
Total Protein, Urine-Ur/day: <72 mg/(24.h) (ref 30–150)
Total Protein, Urine: <4 mg/dL
Total Volume: 1800

## 2024-02-12 ENCOUNTER — Telehealth: Payer: Self-pay | Admitting: Oncology

## 2024-02-12 ENCOUNTER — Inpatient Hospital Stay: Admitting: Oncology

## 2024-02-12 VITALS — BP 136/61 | HR 63 | Temp 96.3°F | Ht 63.0 in | Wt 138.0 lb

## 2024-02-12 DIAGNOSIS — D472 Monoclonal gammopathy: Secondary | ICD-10-CM

## 2024-02-12 DIAGNOSIS — D696 Thrombocytopenia, unspecified: Secondary | ICD-10-CM

## 2024-02-12 NOTE — Assessment & Plan Note (Addendum)
#.    IgG kappa MGUS Labs reviewed and discussed with Multiple myeloma panel showed M protein of 0.1, immunofixation showed IgG monoclonal protein with kappa light chain. [Previously  a biclonal IgG protein with kappa specificity] Lab Results  Component Value Date   MPROTEIN 0.1 (H) 02/02/2024   KPAFRELGTCHN 101.3 (H) 02/02/2024   LAMBDASER 16.7 02/02/2024   KAPLAMBRATIO 6.07 (H) 02/02/2024    Negative skeletal survey. Overall M protein the light chain levels are stable.  Stable kidney function and hemoglobin level.  Recommend monitoring repeat blood work in 10 months

## 2024-02-12 NOTE — Assessment & Plan Note (Signed)
 Previous workup includes normal vitamin B12, normal folate, negative HIV, negative hepatitis panel, negative ANA, normal immature platelet fraction.  Peripheral blood flow cytometry is negative for significant immunophenotypic abnormality.  Labs reviewed and discussed with patient. stable.  Continue to monitor.

## 2024-02-12 NOTE — Progress Notes (Signed)
 " Hematology/Oncology Progress note [phone] [phone]          Patient Care Team: Marylynn Verneita CROME, MD as PCP - General (Internal Medicine) Georjean Darice HERO, MD as Consulting Physician (Neurology) Babara Call, MD as Consulting Physician (Oncology)  ASSESSMENT & PLAN:   MGUS (monoclonal gammopathy of unknown significance) #.  IgG kappa MGUS Labs reviewed and discussed with Multiple myeloma panel showed M protein of 0.1, immunofixation showed IgG monoclonal protein with kappa light chain. [Previously  a biclonal IgG protein with kappa specificity] Lab Results  Component Value Date   MPROTEIN 0.1 (H) 02/02/2024   KPAFRELGTCHN 101.3 (H) 02/02/2024   LAMBDASER 16.7 02/02/2024   KAPLAMBRATIO 6.07 (H) 02/02/2024    Negative skeletal survey. Overall M protein the light chain levels are stable.  Stable kidney function and hemoglobin level.  Recommend monitoring repeat blood work in 10 months  Thrombocytopenia Previous workup includes normal vitamin B12, normal folate, negative HIV, negative hepatitis panel, negative ANA, normal immature platelet fraction.  Peripheral blood flow cytometry is negative for significant immunophenotypic abnormality.  Labs reviewed and discussed with patient. stable.  Continue to monitor.   Orders Placed This Encounter  Procedures   CMP (Cancer Center only)    Standing Status:   Future    Expected Date:   12/12/2024    Expiration Date:   03/12/2025   CBC with Differential (Cancer Center Only)    Standing Status:   Future    Expected Date:   12/12/2024    Expiration Date:   03/12/2025   Multiple Myeloma Panel (SPEP&IFE w/QIG)    Standing Status:   Future    Expected Date:   12/12/2024    Expiration Date:   03/12/2025   Kappa/lambda light chains    Standing Status:   Future    Expected Date:   12/12/2024    Expiration Date:   03/12/2025   IFE+PROTEIN ELECTRO, 24-HR UR    Standing Status:   Future    Expected Date:   12/12/2024     Expiration Date:   03/12/2025    Follow up in 10 months.  All questions were answered. The patient knows to call the clinic with any problems, questions or concerns.  Call Babara, MD, PhD [facility]ematology Oncology 02/12/2024   CHIEF COMPLAINTS/REASON FOR VISIT:  MGUS  HISTORY OF PRESENTING ILLNESS:   Tina Silva is a  89 y.o.  female presents for MGUS  Patient has CKD, follows up with nephrology.  Work up showed  02/21/2021, free light chain ratio 10.4  dad ? MM, m grandmother MM.  03/29/2021, free light chain ratio 11.12 Random urine protein electrophoresis showed faint albumin band detected on urine protein electrophoresis.  No abnormal Bence Jones proteinuria. 04/05/2021, serum protein electrophoresis showed a 2 abnormal protein bands detected in the gamma globulin.  She has chronic history of thrombocytopenia, recent cbc showed platelet count of 111, which is slightly lower than her baseline.  Denies any easy bruising or active bleeding events.  Denies any alcohol use   INTERVAL HISTORY Tina Silva is a 89 y.o. female who has above history reviewed by me today presents for follow up visit for MGUS.  Patient presents to review lab results.  No new complaints.   She has back pain occasionally.    Review of Systems  Constitutional:  Positive for fatigue. Negative for appetite change, chills and fever.  HENT:   Negative for hearing loss and voice change.  Eyes:  Negative for eye problems.  Respiratory:  Negative for chest tightness and cough.   Cardiovascular:  Negative for chest pain.  Gastrointestinal:  Negative for abdominal distention, abdominal pain and blood in stool.  Endocrine: Negative for hot flashes.  Genitourinary:  Negative for difficulty urinating and frequency.   Musculoskeletal:  Positive for back pain. Negative for arthralgias.  Skin:  Negative for itching and rash.  Neurological:  Negative for extremity weakness.  Hematological:  Negative for  adenopathy.  Psychiatric/Behavioral:  Negative for confusion.     MEDICAL HISTORY:  Past Medical History:  Diagnosis Date   Allergy    Arthritis    Cataract    Chronic kidney disease    Hyperlipidemia    Hypertension    Hypothyroidism    Multiple myeloma (HCC)    Osteoporosis    Stroke (HCC)    mini   Vertigo 07/28/2016    SURGICAL HISTORY: Past Surgical History:  Procedure Laterality Date   ABDOMINAL HYSTERECTOMY     APPENDECTOMY  1974   BREAST BIOPSY Left 11/04/2005   core   COLONOSCOPY WITH PROPOFOL  N/A 02/12/2016   Procedure: COLONOSCOPY WITH PROPOFOL ;  Surgeon: Lamar ONEIDA Holmes, MD;  Location: [facility] ENDOSCOPY;  Service: Endoscopy;  Laterality: N/A;   EYE SURGERY  june 2013   cataract with lens,  dinglein left eye    TONSILLECTOMY     TONSILLECTOMY AND ADENOIDECTOMY      SOCIAL HISTORY: Social History   Socioeconomic History   Marital status: Single    Spouse name: Not on file   Number of children: Not on file   Years of education: Not on file   Highest education level: Bachelor's degree (e.g., BA, AB, BS)  Occupational History   Not on file  Tobacco Use   Smoking status: Never   Smokeless tobacco: Never  Vaping Use   Vaping status: Never Used  Substance and Sexual Activity   Alcohol use: No   Drug use: No   Sexual activity: Never  Other Topics Concern   Not on file  Social History Narrative   Independent at baseline   Left handed    Social Drivers of Health   Tobacco Use: Low Risk (12/23/2023)   Patient History    Smoking Tobacco Use: Never    Smokeless Tobacco Use: Never    Passive Exposure: Not on file  Financial Resource Strain: Low Risk (12/19/2023)   Overall Financial Resource Strain (CARDIA)    Difficulty of Paying Living Expenses: Not hard at all  Food Insecurity: No Food Insecurity (12/19/2023)   Epic    Worried About Programme Researcher, Broadcasting/film/video in the Last Year: Never true    Ran Out of Food in the Last Year: Never true  Transportation  Needs: No Transportation Needs (12/19/2023)   Epic    Lack of Transportation (Medical): No    Lack of Transportation (Non-Medical): No  Physical Activity: Sufficiently Active (12/19/2023)   Exercise Vital Sign    Days of Exercise per Week: 5 days    Minutes of Exercise per Session: 30 min  Stress: No Stress Concern Present (12/19/2023)   [employer] of Occupational Health - Occupational Stress Questionnaire    Feeling of Stress: Not at all  Social Connections: Moderately Integrated (12/19/2023)   Social Connection and Isolation Panel    Frequency of Communication with Friends and Family: More than three times a week    Frequency of Social Gatherings with Friends and Family: Twice a week  Attends Religious Services: More than 4 times per year    Active Member of Clubs or Organizations: Yes    Attends Banker Meetings: More than 4 times per year    Marital Status: Never married  Intimate Partner Violence: Not At Risk (04/14/2023)   Humiliation, Afraid, Rape, and Kick questionnaire    Fear of Current or Ex-Partner: No    Emotionally Abused: No    Physically Abused: No    Sexually Abused: No  Depression (PHQ2-9): Low Risk (02/12/2024)   Depression (PHQ2-9)    PHQ-2 Score: 0  Alcohol Screen: Low Risk (04/14/2023)   Alcohol Screen    Last Alcohol Screening Score (AUDIT): 0  Housing: Low Risk (12/19/2023)   Epic    Unable to Pay for Housing in the Last Year: No    Number of Times Moved in the Last Year: 0    Homeless in the Last Year: No  Utilities: Not At Risk (04/14/2023)   AHC Utilities    Threatened with loss of utilities: No  Health Literacy: Adequate Health Literacy (04/14/2023)   B1300 Health Literacy    Frequency of need for help with medical instructions: Never    FAMILY HISTORY: Family History  Problem Relation Age of Onset   Parkinson's disease Mother    Miscarriages / Stillbirths Mother    Stroke Mother    Cancer Sister        AML   Diabetes  Sister    Heart disease Father    Diabetes Paternal Aunt    Cancer Maternal Grandmother    Arthritis Maternal Grandmother    Cancer Sister    Diabetes Sister    Diabetes Paternal Aunt    Breast cancer Neg Hx     ALLERGIES:  is allergic to lidocaine -epinephrine (pf), ciprofloxacin, doxycycline, flagyl [metronidazole], iodine, lidocaine , and sudafed [pseudoephedrine hcl].  MEDICATIONS:  Current Outpatient Medications  Medication Sig Dispense Refill   aspirin  EC 81 MG tablet Take 81 mg by mouth daily. Swallow whole.     Calcium  Carbonate-Vit D-Min (CALTRATE PLUS PO) Take 1,200 mg by mouth daily.     Cholecalciferol  (VITAMIN D3) 1000 units CAPS Take 1 capsule by mouth.     losartan  (COZAAR ) 25 MG tablet Take 1 tablet (25 mg total) by mouth daily. 90 tablet 1   metoprolol  succinate (TOPROL -XL) 25 MG 24 hr tablet TAKE ONE-HALF TABLET TWICE DAILY 90 tablet 1   Multiple Vitamin (MULTIVITAMIN) tablet Take 1 tablet by mouth daily.     propranolol  (INDERAL ) 10 MG tablet Once daily as needed for bp > 140 or pulse >  100 30 tablet 0   rosuvastatin  (CRESTOR ) 10 MG tablet Take 1 tablet (10 mg total) by mouth daily. 90 tablet 3   No current facility-administered medications for this visit.     PHYSICAL EXAMINATION: ECOG PERFORMANCE STATUS: 1 - Symptomatic but completely ambulatory Vitals:   02/12/24 0959  BP: 136/61  Pulse: 63  Temp: (!) 96.3 F (35.7 C)  SpO2: 100%   Filed Weights   02/12/24 0959  Weight: 138 lb (62.6 kg)    Physical Exam Constitutional:      General: She is not in acute distress. HENT:     Head: Normocephalic and atraumatic.  Eyes:     General: No scleral icterus. Cardiovascular:     Rate and Rhythm: Normal rate and regular rhythm.     Heart sounds: Normal heart sounds.  Pulmonary:     Effort: Pulmonary effort is normal.  No respiratory distress.     Breath sounds: No wheezing.  Abdominal:     General: Bowel sounds are normal. There is no distension.      Palpations: Abdomen is soft.  Musculoskeletal:        General: No deformity. Normal range of motion.     Cervical back: Normal range of motion and neck supple.  Skin:    General: Skin is warm and dry.     Findings: No erythema or rash.  Neurological:     Mental Status: She is alert and oriented to person, place, and time. Mental status is at baseline.     Cranial Nerves: No cranial nerve deficit.     Coordination: Coordination normal.  Psychiatric:        Mood and Affect: Mood normal.     LABORATORY DATA:  I have reviewed the data as listed    Latest Ref Rng & Units 02/02/2024    9:21 AM 07/28/2023    9:50 AM 01/13/2023    9:33 AM  CBC  WBC 4.0 - 10.5 K/uL 4.7  4.7  4.2   Hemoglobin 12.0 - 15.0 g/dL 86.7  86.8  86.9   Hematocrit 36.0 - 46.0 % 39.4  39.1  38.6   Platelets 150 - 400 K/uL 111  112  121       Latest Ref Rng & Units 02/02/2024    9:21 AM 12/23/2023   11:04 AM 07/28/2023    9:50 AM  CMP  Glucose 70 - 99 mg/dL 96  88  97   BUN 8 - 23 mg/dL 20  26  22    Creatinine 0.44 - 1.00 mg/dL 9.03  9.04  9.05   Sodium 135 - 145 mmol/L 139  140  137   Potassium 3.5 - 5.1 mmol/L 4.6  4.5  4.4   Chloride 98 - 111 mmol/L 102  101  102   CO2 22 - 32 mmol/L 32  37  28   Calcium  8.9 - 10.3 mg/dL 9.7  9.9  8.8   Total Protein 6.5 - 8.1 g/dL 6.1  5.9  6.1   Total Bilirubin 0.0 - 1.2 mg/dL 0.6  0.6  0.9   Alkaline Phos 38 - 126 U/L 61  53  58   AST 15 - 41 U/L 21  17  21    ALT 0 - 44 U/L 20  16  18      RADIOGRAPHIC STUDIES: I have personally reviewed the radiological images as listed and agreed with the findings in the report. No results found.  "

## 2024-02-12 NOTE — Telephone Encounter (Signed)
 Patient called to reschedule November appointment due to already having an appointment that day.   Appointment rescheduled as requested

## 2024-04-16 ENCOUNTER — Ambulatory Visit

## 2024-06-22 ENCOUNTER — Ambulatory Visit: Admitting: Internal Medicine

## 2024-12-13 ENCOUNTER — Inpatient Hospital Stay

## 2024-12-27 ENCOUNTER — Inpatient Hospital Stay: Admitting: Oncology

## 2024-12-29 ENCOUNTER — Inpatient Hospital Stay: Admitting: Oncology
